# Patient Record
Sex: Male | Born: 1985 | Race: White | Hispanic: No | Marital: Single | Smoking: Current some day smoker
Health system: Southern US, Community
[De-identification: ages and names within clinical notes are randomized; demographics above are authoritative.]

## PROBLEM LIST (undated history)

## (undated) DIAGNOSIS — E119 Type 2 diabetes mellitus without complications: Secondary | ICD-10-CM

## (undated) DIAGNOSIS — R45851 Suicidal ideations: Secondary | ICD-10-CM

---
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
## 2003-03-26 ENCOUNTER — Encounter: Payer: Self-pay | Admitting: *Deleted

## 2003-03-26 ENCOUNTER — Emergency Department (HOSPITAL_COMMUNITY): Admission: EM | Admit: 2003-03-26 | Discharge: 2003-03-26 | Payer: Self-pay | Admitting: *Deleted

## 2003-05-21 ENCOUNTER — Emergency Department (HOSPITAL_COMMUNITY): Admission: EM | Admit: 2003-05-21 | Discharge: 2003-05-21 | Payer: Self-pay | Admitting: *Deleted

## 2003-05-21 ENCOUNTER — Encounter: Payer: Self-pay | Admitting: *Deleted

## 2003-06-04 ENCOUNTER — Emergency Department (HOSPITAL_COMMUNITY): Admission: EM | Admit: 2003-06-04 | Discharge: 2003-06-04 | Payer: Self-pay | Admitting: Emergency Medicine

## 2003-07-03 ENCOUNTER — Emergency Department (HOSPITAL_COMMUNITY): Admission: EM | Admit: 2003-07-03 | Discharge: 2003-07-03 | Payer: Self-pay | Admitting: *Deleted

## 2003-09-05 ENCOUNTER — Emergency Department (HOSPITAL_COMMUNITY): Admission: EM | Admit: 2003-09-05 | Discharge: 2003-09-06 | Payer: Self-pay | Admitting: *Deleted

## 2008-07-01 ENCOUNTER — Emergency Department (HOSPITAL_COMMUNITY): Admission: EM | Admit: 2008-07-01 | Discharge: 2008-07-01 | Payer: Self-pay | Admitting: Emergency Medicine

## 2009-04-28 ENCOUNTER — Emergency Department (HOSPITAL_COMMUNITY): Admission: EM | Admit: 2009-04-28 | Discharge: 2009-04-28 | Payer: Self-pay | Admitting: Emergency Medicine

## 2010-10-17 ENCOUNTER — Emergency Department (HOSPITAL_COMMUNITY)
Admission: EM | Admit: 2010-10-17 | Discharge: 2010-10-17 | Payer: Self-pay | Source: Home / Self Care | Admitting: Emergency Medicine

## 2010-11-11 ENCOUNTER — Emergency Department (HOSPITAL_COMMUNITY)
Admission: EM | Admit: 2010-11-11 | Discharge: 2010-11-11 | Payer: Self-pay | Source: Home / Self Care | Admitting: Emergency Medicine

## 2010-11-16 LAB — POCT CARDIAC MARKERS: Myoglobin, poc: 54.9 ng/mL (ref 12–200)

## 2012-01-18 ENCOUNTER — Encounter (HOSPITAL_COMMUNITY): Payer: Self-pay | Admitting: Emergency Medicine

## 2012-01-18 ENCOUNTER — Emergency Department (HOSPITAL_COMMUNITY)
Admission: EM | Admit: 2012-01-18 | Discharge: 2012-01-19 | Disposition: A | Discharge status: Home / Self Care | Payer: Self-pay | Attending: Emergency Medicine | Admitting: Emergency Medicine

## 2012-01-18 DIAGNOSIS — R112 Nausea with vomiting, unspecified: Secondary | ICD-10-CM | POA: Insufficient documentation

## 2012-01-18 DIAGNOSIS — B9789 Other viral agents as the cause of diseases classified elsewhere: Secondary | ICD-10-CM | POA: Insufficient documentation

## 2012-01-18 DIAGNOSIS — B349 Viral infection, unspecified: Secondary | ICD-10-CM

## 2012-01-18 DIAGNOSIS — R197 Diarrhea, unspecified: Secondary | ICD-10-CM | POA: Insufficient documentation

## 2012-01-18 DIAGNOSIS — IMO0001 Reserved for inherently not codable concepts without codable children: Secondary | ICD-10-CM | POA: Insufficient documentation

## 2012-01-18 LAB — BASIC METABOLIC PANEL WITH GFR
BUN: 14 mg/dL (ref 6–23)
CO2: 28 meq/L (ref 19–32)
GFR calc non Af Amer: >90 mL/min (ref >90)
Glucose, Bld: 105 mg/dL — ABNORMAL HIGH (ref 70–99)
Potassium: 3.8 meq/L (ref 3.5–5.1)
Sodium: 135 meq/L (ref 135–145)

## 2012-01-18 LAB — DIFFERENTIAL
Basophils Absolute: 0 10*3/uL (ref 0.0–0.1)
Basophils Relative: 0 % (ref 0–1)
Eosinophils Absolute: 0.3 K/uL (ref 0.0–0.7)
Eosinophils Relative: 3 % (ref 0–5)
Lymphocytes Relative: 40 % (ref 12–46)
Lymphs Abs: 3.6 K/uL (ref 0.7–4.0)
Monocytes Absolute: 0.7 10*3/uL (ref 0.1–1.0)
Monocytes Relative: 7 % (ref 3–12)
Neutro Abs: 4.6 10*3/uL (ref 1.7–7.7)
Neutrophils Relative %: 50 % (ref 43–77)

## 2012-01-18 LAB — CBC
HCT: 41.1 % (ref 39.0–52.0)
Hemoglobin: 14.3 g/dL (ref 13.0–17.0)
MCH: 29.3 pg (ref 26.0–34.0)
MCHC: 34.8 g/dL (ref 30.0–36.0)
MCV: 84.2 fL (ref 78.0–100.0)
Platelets: 244 10*3/uL (ref 150–400)
RBC: 4.88 MIL/uL (ref 4.22–5.81)
RDW: 12.9 % (ref 11.5–15.5)
WBC: 9.1 K/uL (ref 4.0–10.5)

## 2012-01-18 LAB — BASIC METABOLIC PANEL
Calcium: 9 mg/dL (ref 8.4–10.5)
Chloride: 98 mEq/L (ref 96–112)
Creatinine, Ser: 1.02 mg/dL (ref 0.50–1.35)
GFR calc Af Amer: >90 mL/min (ref >90)

## 2012-01-18 NOTE — ED Notes (Signed)
PT. REPORTS VOMITTING WITH DIARRHEA , CHILLS AND ABDOMINAL CRAMPING ONSET LAST NIGHT WORSE THIS EVENING .

## 2012-01-19 MED ORDER — PROMETHAZINE HCL 25 MG PO TABS: 25.0000 mg | ORAL_TABLET | Freq: Four times a day (QID) | ORAL | Status: AC | PRN

## 2012-01-19 MED ORDER — ONDANSETRON 4 MG PO TBDP
8.0000 mg | ORAL_TABLET | Freq: Once | ORAL | Status: AC
  Administered 2012-01-19: 8 mg via ORAL
  Filled 2012-01-19: qty 2

## 2012-01-19 NOTE — Discharge Instructions (Signed)
You were seen and evaluated today for your symptoms of nausea vomiting and diarrhea. Your lab test today did not show any signs for concerning cause of your symptoms. At this time your providers feel your symptoms are caused from a viral stomach infection. It is important a drink plenty of fluids to stay hydrated and get lots of rest to help your body fight the infection.  Viral Syndrome You or your child has Viral Syndrome. It is the most common infection causing "colds" and infections in the nose, throat, sinuses, and breathing tubes. Sometimes the infection causes nausea, vomiting, or diarrhea. The germ that causes the infection is a virus. No antibiotic or other medicine will kill it. There are medicines that you can take to make you or your child more comfortable.  HOME CARE INSTRUCTIONS   Rest in bed until you start to feel better.   If you have diarrhea or vomiting, eat small amounts of crackers and toast. Soup is helpful.   Do not give aspirin or medicine that contains aspirin to children.   Only take over-the-counter or prescription medicines for pain, discomfort, or fever as directed by your caregiver.  SEEK IMMEDIATE MEDICAL CARE IF:   You or your child has not improved within one week.   You or your child has pain that is not at least partially relieved by over-the-counter medicine.   Thick, colored mucus or blood is coughed up.   Discharge from the nose becomes thick yellow or green.   Diarrhea or vomiting gets worse.   There is any major change in your or your child's condition.   You or your child develops a skin rash, stiff neck, severe headache, or are unable to hold down food or fluid.   You or your child has an oral temperature above 102 F (38.9 C), not controlled by medicine.   Your baby is older than 3 months with a rectal temperature of 102 F (38.9 C) or higher.   Your baby is 59 months old or younger with a rectal temperature of 100.4 F (38 C) or higher.    Document Released: 09/26/2006 Document Revised: 09/30/2011 Document Reviewed: 09/27/2007 [facility] Patient Information 2012 [employer], [place].   RESOURCE GUIDE  Dental Problems  Patients with Medicaid: [facility] [phone] [address]                                           [phone] [address] Phone:  [phone]                                                  Phone:  [phone]  If unable to pay or uninsured, contact:  [facility] or [facility]. to become qualified for the adult dental clinic.  Chronic Pain Problems Contact [facility]  [phone] Patients need to be referred by their primary care doctor.  Insufficient Money for Medicine Contact United Way:  call "211" or [facility] [phone].  No Primary Care Doctor Call [facility]  3395184420 Other agencies that provide inexpensive medical care  [facility]  [phone]    [facility]  [phone]    [facility]  [phone]    [facility]  [phone]    [facility]  [phone]    [facility]  [phone]  Psychological Services [facility]  [phone] [facility]  [phone] [facility]   [phone] (emergency services [phone])  Substance Abuse Resources [facility]  ([phone] 213-1284 [facility] ([phone] [facility] ([phone] Floydene Flock [phone] Residential & Outpatient Substance Abuse Program  [phone]  Abuse/Neglect [facility] Child Abuse Hotline (262)170-2817 [facility] Child Abuse Hotline 819-609-7987 (After Hours)  Emergency Shelter [facility] [phone]  Maternity Homes [facility] at [facility] 617-430-1923 [facility] [phone]  MRSA Hotline #:   309-461-5995    [facility] Resources  [facility]     [employer]                          [facility] Dept. [address]                        [address]      [address]  Blondell Reveal Phone:  [phone]                                   Phone:  [phone]                 Phone:  [phone]  [facility] Phone:  [phone]  [facility] Child Abuse Hotline [phone] [phone] (After Hours)

## 2012-01-19 NOTE — ED Provider Notes (Signed)
History     CSN: [id number]  Arrival date & time 01/18/12  2200   First MD Initiated Contact with Patient 01/19/12 0045      Chief Complaint  Patient presents with  . Emesis     HPI  History provided by the patient. Patient is a 26 year old male with no significant past medical history presents with complaints of episodes of nausea vomiting and diarrhea that began yesterday. Patient reports having 3 episodes of vomiting throughout the day. Patient also has some associated chills, abdominal cramping and generalized fatigue.  Pt does not report any known sick contacts.  Pt has not taken anything for symptoms.  Pt has been able to tolerate PO fluids.  Symptoms are moderate.  He denies any other aggravating or alleviating factors.     History reviewed. No pertinent past medical history.  History reviewed. No pertinent past surgical history.  No family history on file.  History  Substance Use Topics  . Smoking status: Never Smoker   . Smokeless tobacco: Not on file  . Alcohol Use: No      Review of Systems  Constitutional: Positive for chills, appetite change and fatigue. Negative for fever.  HENT: Negative for congestion and rhinorrhea.   Respiratory: Negative for cough and shortness of breath.   Gastrointestinal: Positive for nausea, vomiting, abdominal pain and diarrhea.  Genitourinary: Negative for dysuria and flank pain.    Allergies  Review of patient's allergies indicates no known allergies.  Home Medications   Current Outpatient Rx  Name Route Sig Dispense Refill  . ACETAMINOPHEN 500 MG PO TABS Oral Take 1,000 mg by mouth once.      BP 116/77  Pulse 78  Temp(Src) 97.8 F (36.6 C) (Oral)  Resp 16  SpO2 100%  Physical Exam  Nursing note and vitals reviewed. Constitutional: He is oriented to person, place, and time. He appears well-developed and well-nourished. No distress.  HENT:  Head: Normocephalic.  Mouth/Throat: Oropharynx is clear and moist.    Neck: Normal range of motion. Neck supple.       No meningeal sign  Cardiovascular: Normal rate and regular rhythm.   Pulmonary/Chest: Effort normal and breath sounds normal. No respiratory distress. He has no wheezes.  Abdominal: Soft. He exhibits no distension. There is no tenderness. There is no rebound and no guarding.  Neurological: He is alert and oriented to person, place, and time.  Skin: Skin is warm. No rash noted.  Psychiatric: He has a normal mood and affect. His behavior is normal.    ED Course  Procedures   Results for orders placed during the hospital encounter of 01/18/12  CBC      Component Value Range   WBC 9.1  4.0 - 10.5 (K/uL)   RBC 4.88  4.22 - 5.81 (MIL/uL)   Hemoglobin 14.3  13.0 - 17.0 (g/dL)   HCT 40.9  81.1 - 91.4 (%)   MCV 84.2  78.0 - 100.0 (fL)   MCH 29.3  26.0 - 34.0 (pg)   MCHC 34.8  30.0 - 36.0 (g/dL)   RDW 78.2  95.6 - 21.3 (%)   Platelets 244  150 - 400 (K/uL)  DIFFERENTIAL      Component Value Range   Neutrophils Relative 50  43 - 77 (%)   Neutro Abs 4.6  1.7 - 7.7 (K/uL)   Lymphocytes Relative 40  12 - 46 (%)   Lymphs Abs 3.6  0.7 - 4.0 (K/uL)   Monocytes Relative 7  3 - 12 (%)   Monocytes Absolute 0.7  0.1 - 1.0 (K/uL)   Eosinophils Relative 3  0 - 5 (%)   Eosinophils Absolute 0.3  0.0 - 0.7 (K/uL)   Basophils Relative 0  0 - 1 (%)   Basophils Absolute 0.0  0.0 - 0.1 (K/uL)  BASIC METABOLIC PANEL      Component Value Range   Sodium 135  135 - 145 (mEq/L)   Potassium 3.8  3.5 - 5.1 (mEq/L)   Chloride 98  96 - 112 (mEq/L)   CO2 28  19 - 32 (mEq/L)   Glucose, Bld 105 (*) 70 - 99 (mg/dL)   BUN 14  6 - 23 (mg/dL)   Creatinine, Ser 4.09  0.50 - 1.35 (mg/dL)   Calcium 9.0  8.4 - 81.1 (mg/dL)   GFR calc non Af Amer >90  >90 (mL/min)   GFR calc Af Amer >90  >90 (mL/min)        1. Nausea vomiting and diarrhea   2. Viral syndrome       MDM  1:10 AM patient seen and evaluated. Patient no acute distress  Patient tolerating by  mouth fluids. Symptoms consistent with process. Labs unremarkable. Will discharge at this time.      Angus Seller, [place] 01/20/12 1715

## 2012-01-19 NOTE — ED Notes (Signed)
Pt c/o emesis x 2 and diarrhea x 1 since yesterday.  He states he came to ED b/c he vomited at work and his boss told him to come to the ED.  Denies pain.  Has only been drinking fluids today.

## 2012-01-19 NOTE — ED Notes (Signed)
Pt tolerated grape juice with no emesis.

## 2012-01-24 NOTE — ED Provider Notes (Signed)
Medical screening examination/treatment/procedure(s) were performed by non-physician practitioner and as supervising physician I was immediately available for consultation/collaboration.   Gavin Pound. Isyss Espinal, MD 01/24/12 1744

## 2012-06-01 ENCOUNTER — Encounter (HOSPITAL_COMMUNITY): Payer: Self-pay

## 2012-06-01 ENCOUNTER — Emergency Department (HOSPITAL_COMMUNITY): Payer: Self-pay

## 2012-06-01 ENCOUNTER — Emergency Department (HOSPITAL_COMMUNITY)
Admission: EM | Admit: 2012-06-01 | Discharge: 2012-06-01 | Disposition: A | Discharge status: Home / Self Care | Payer: Self-pay | Attending: Emergency Medicine | Admitting: Emergency Medicine

## 2012-06-01 DIAGNOSIS — R1032 Left lower quadrant pain: Secondary | ICD-10-CM | POA: Insufficient documentation

## 2012-06-01 LAB — URINALYSIS, ROUTINE W REFLEX MICROSCOPIC
Ketones, ur: NEGATIVE mg/dL
Leukocytes, UA: NEGATIVE
Nitrite: NEGATIVE
Specific Gravity, Urine: 1.024 (ref 1.005–1.030)
pH: 5.5 (ref 5.0–8.0)

## 2012-06-01 LAB — CBC WITH DIFFERENTIAL/PLATELET
Basophils Absolute: 0 10*3/uL (ref 0.0–0.1)
Lymphocytes Relative: 39 % (ref 12–46)
Neutro Abs: 5.8 10*3/uL (ref 1.7–7.7)
Neutrophils Relative %: 52 % (ref 43–77)
Platelets: 232 10*3/uL (ref 150–400)
RDW: 12.9 % (ref 11.5–15.5)
WBC: 11.1 10*3/uL — ABNORMAL HIGH (ref 4.0–10.5)

## 2012-06-01 LAB — COMPREHENSIVE METABOLIC PANEL
AST: 18 U/L (ref 0–37)
Albumin: 4.2 g/dL (ref 3.5–5.2)
Chloride: 101 mEq/L (ref 96–112)
Creatinine, Ser: 0.97 mg/dL (ref 0.50–1.35)
Potassium: 3.5 mEq/L (ref 3.5–5.1)
Total Bilirubin: 0.5 mg/dL (ref 0.3–1.2)
Total Protein: 7.3 g/dL (ref 6.0–8.3)

## 2012-06-01 MED ORDER — HYDROMORPHONE HCL PF 1 MG/ML IJ SOLN
1.0000 mg | Freq: Once | INTRAMUSCULAR | Status: AC
  Administered 2012-06-01: 1 mg via INTRAVENOUS
  Filled 2012-06-01: qty 1

## 2012-06-01 MED ORDER — ONDANSETRON 4 MG PO TBDP
4.0000 mg | ORAL_TABLET | Freq: Once | ORAL | Status: AC
  Administered 2012-06-01: 4 mg via ORAL
  Filled 2012-06-01: qty 1

## 2012-06-01 MED ORDER — ONDANSETRON HCL 4 MG/2ML IJ SOLN
4.0000 mg | Freq: Once | INTRAMUSCULAR | Status: AC
  Administered 2012-06-01: 4 mg via INTRAVENOUS
  Filled 2012-06-01: qty 2

## 2012-06-01 NOTE — ED Provider Notes (Signed)
History     CSN: [id number]  Arrival date & time 06/01/12  4098   First MD Initiated Contact with Patient 06/01/12 [phone]      Chief Complaint  Patient presents with  . Abdominal Pain    (Consider location/radiation/quality/duration/timing/severity/associated sxs/prior treatment) HPI Comments: Kevin Russell 26 y.o. male   The chief complaint is: Patient presents with:   Abdominal Pain    Patient awoke at 7:45 am this morning with acute LLQ pain which he describes as colicky and intermittent.  He states that he thought he needed to use the bathroom and sat for 45 minutes  Without going. He states that pain was intolerable.  His last BM was at midnight the night before. Patient often has hard stool, but uses the bathroom daily. Denies fevers, chills, myalgias, arthralgias, nausea, vomiting, diarrhea. Never had anything like this before. He  Denies melena, hematochezia. Denies any bulges in groin abdomen or scrotum. Denies history of diverticulosis. No urinary symptoms. No CP or SOB.      The history is provided by the patient and the spouse. No language interpreter was used.    History reviewed. No pertinent past medical history.  History reviewed. No pertinent past surgical history.  History reviewed. No pertinent family history.  History  Substance Use Topics  . Smoking status: Never Smoker   . Smokeless tobacco: Not on file  . Alcohol Use: No      Review of Systems  Constitutional: Negative for fever and chills.  Respiratory: Negative for shortness of breath.   Cardiovascular: Negative for chest pain.  Gastrointestinal: Positive for abdominal pain. Negative for diarrhea, constipation, blood in stool and abdominal distention.  Genitourinary: Negative for dysuria, frequency, hematuria, flank pain, scrotal swelling, penile pain and testicular pain.  Musculoskeletal: Negative for myalgias.    Allergies  Review of patient's allergies indicates no known  allergies.  Home Medications  No current outpatient prescriptions on file.  BP 100/57  Pulse 64  Temp 97.5 F (36.4 C) (Oral)  Resp 18  SpO2 98%  Physical Exam  Nursing note and vitals reviewed. Constitutional: He is oriented to person, place, and time. He appears well-developed and well-nourished. He appears distressed (patient is writhing around and appears to be in acute pain).  HENT:  Head: Normocephalic and atraumatic.  Eyes: Conjunctivae are normal. No scleral icterus.  Neck: Normal range of motion. Neck supple. No JVD present.  Cardiovascular: Normal rate, regular rhythm and normal heart sounds.   Pulmonary/Chest: Effort normal and breath sounds normal. No respiratory distress.  Abdominal: He exhibits no distension and no mass. There is tenderness. There is guarding. There is no rebound.  Genitourinary:       No scrotal masses. No direct or indirect hernias palpated.  Neurological: He is alert and oriented to person, place, and time.  Skin: Skin is warm and dry. He is not diaphoretic.    ED Course  Procedures (including critical care time) Results for orders placed during the hospital encounter of 06/01/12  CBC WITH DIFFERENTIAL      Component Value Range   WBC 11.1 (*) 4.0 - 10.5 K/uL   RBC 4.97  4.22 - 5.81 MIL/uL   Hemoglobin 14.5  13.0 - 17.0 g/dL   HCT 47.8  29.5 - 62.1 %   MCV 84.5  78.0 - 100.0 fL   MCH 29.2  26.0 - 34.0 pg   MCHC 34.5  30.0 - 36.0 g/dL   RDW 30.8  65.7 - 84.6 %  Platelets 232  150 - 400 K/uL   Neutrophils Relative 52  43 - 77 %   Neutro Abs 5.8  1.7 - 7.7 K/uL   Lymphocytes Relative 39  12 - 46 %   Lymphs Abs 4.3 (*) 0.7 - 4.0 K/uL   Monocytes Relative 8  3 - 12 %   Monocytes Absolute 0.9  0.1 - 1.0 K/uL   Eosinophils Relative 1  0 - 5 %   Eosinophils Absolute 0.2  0.0 - 0.7 K/uL   Basophils Relative 0  0 - 1 %   Basophils Absolute 0.0  0.0 - 0.1 K/uL  URINALYSIS, ROUTINE W REFLEX MICROSCOPIC      Component Value Range   Color,  Urine YELLOW  YELLOW   APPearance CLEAR  CLEAR   Specific Gravity, Urine 1.024  1.005 - 1.030   pH 5.5  5.0 - 8.0   Glucose, UA NEGATIVE  NEGATIVE mg/dL   Hgb urine dipstick NEGATIVE  NEGATIVE   Bilirubin Urine NEGATIVE  NEGATIVE   Ketones, ur NEGATIVE  NEGATIVE mg/dL   Protein, ur NEGATIVE  NEGATIVE mg/dL   Urobilinogen, UA 0.2  0.0 - 1.0 mg/dL   Nitrite NEGATIVE  NEGATIVE   Leukocytes, UA NEGATIVE  NEGATIVE  COMPREHENSIVE METABOLIC PANEL      Component Value Range   Sodium 139  135 - 145 mEq/L   Potassium 3.5  3.5 - 5.1 mEq/L   Chloride 101  96 - 112 mEq/L   CO2 29  19 - 32 mEq/L   Glucose, Bld 120 (*) 70 - 99 mg/dL   BUN 10  6 - 23 mg/dL   Creatinine, Ser 1.61  0.50 - 1.35 mg/dL   Calcium 9.1  8.4 - 09.6 mg/dL   Total Protein 7.3  6.0 - 8.3 g/dL   Albumin 4.2  3.5 - 5.2 g/dL   AST 18  0 - 37 U/L   ALT 11  0 - 53 U/L   Alkaline Phosphatase 70  39 - 117 U/L   Total Bilirubin 0.5  0.3 - 1.2 mg/dL   GFR calc non Af Amer >90  >90 mL/min   GFR calc Af Amer >90  >90 mL/min     Dg Abd Acute W/chest  06/01/2012  *RADIOLOGY REPORT*  Clinical Data: Left lower abdominal pain.  Fever.  ACUTE ABDOMEN SERIES (ABDOMEN 2 VIEW & CHEST 1 VIEW)  Comparison: None.  Findings: Cardiac and mediastinal contours appear normal.  The lungs appear clear.  No pleural effusion is identified.  Unremarkable bowel gas pattern noted.  Faint calcifications in the lower pelvis are likely vascular.  IMPRESSION:  1.  No acute findings to explain the patient's symptoms.  Original Report Authenticated By: Dellia Cloud, M.D.    12:06 PM BP 100/57  Pulse 64  Temp 97.5 F (36.4 C) (Oral)  Resp 18  SpO2 98% Patient is relaxed and cooperative after 1 mg dilaudid.  Labs and Xray are unremarkable for cause of acute pain.  Abdominal exam show no distention, masses, or guarding. [name] is non tender on examin the LLQ. I believe he is stable and does not have an emergent cause of abdominal pain. Pain likely  from constipation.  I will d/c the patient home with miralax and stool softeners. No diagnosis found.    MDM  D/c patient home. Patient with probable constipation. Discussed reasons to seek immediate care. Patient expresses understanding and agrees with plan.        Kevin Russell  Kevin Pea, PA-C 06/01/12 2025  Arthor Captain, PA-C 06/01/12 2029

## 2012-06-01 NOTE — ED Notes (Signed)
MD at bedside. 

## 2012-06-01 NOTE — ED Notes (Signed)
Lab at bedside

## 2012-06-01 NOTE — ED Notes (Signed)
pt reports waking 0745 this am w/LLQ pain, denies N/V/D, no hx of the same

## 2012-06-02 NOTE — ED Provider Notes (Signed)
Medical screening examination/treatment/procedure(s) were performed by non-physician practitioner and as supervising physician I was immediately available for consultation/collaboration.  Chirag Krueger L Ewell Benassi, MD 06/02/12 0940 

## 2017-04-06 ENCOUNTER — Encounter (HOSPITAL_COMMUNITY): Payer: Self-pay | Admitting: *Deleted

## 2017-04-06 ENCOUNTER — Emergency Department (HOSPITAL_COMMUNITY)
Admission: EM | Admit: 2017-04-06 | Discharge: 2017-04-07 | Disposition: A | Discharge status: Other Acute Inpatient Hospital | Payer: Self-pay | Attending: Emergency Medicine | Admitting: Emergency Medicine

## 2017-04-06 ENCOUNTER — Emergency Department (HOSPITAL_COMMUNITY): Payer: Self-pay

## 2017-04-06 DIAGNOSIS — K047 Periapical abscess without sinus: Secondary | ICD-10-CM | POA: Diagnosis present

## 2017-04-06 DIAGNOSIS — L03211 Cellulitis of face: Secondary | ICD-10-CM | POA: Diagnosis present

## 2017-04-06 LAB — CBC WITH DIFFERENTIAL/PLATELET
BASOS ABS: 0 10*3/uL (ref 0.0–0.1)
BASOS PCT: 0 %
EOS ABS: 0 10*3/uL (ref 0.0–0.7)
Eosinophils Relative: 0 %
HCT: 42.3 % (ref 39.0–52.0)
Hemoglobin: 14.6 g/dL (ref 13.0–17.0)
Lymphocytes Relative: 19 %
Lymphs Abs: 2.8 10*3/uL (ref 0.7–4.0)
MCH: 29.6 pg (ref 26.0–34.0)
MCHC: 34.5 g/dL (ref 30.0–36.0)
MCV: 85.6 fL (ref 78.0–100.0)
MONO ABS: 1.8 10*3/uL — AB (ref 0.1–1.0)
MONOS PCT: 12 %
Neutro Abs: 10.2 10*3/uL — ABNORMAL HIGH (ref 1.7–7.7)
Neutrophils Relative %: 69 %
Platelets: 250 10*3/uL (ref 150–400)
RBC: 4.94 MIL/uL (ref 4.22–5.81)
RDW: 12.5 % (ref 11.5–15.5)
WBC: 14.8 10*3/uL — ABNORMAL HIGH (ref 4.0–10.5)

## 2017-04-06 LAB — I-STAT CHEM 8, ED
BUN: 10 mg/dL (ref 6–20)
Calcium, Ion: 1.07 mmol/L — ABNORMAL LOW (ref 1.15–1.40)
Chloride: 99 mmol/L — ABNORMAL LOW (ref 101–111)
Creatinine, Ser: 1.3 mg/dL — ABNORMAL HIGH (ref 0.61–1.24)
Glucose, Bld: 96 mg/dL (ref 65–99)
HEMATOCRIT: 42 % (ref 39.0–52.0)
HEMOGLOBIN: 14.3 g/dL (ref 13.0–17.0)
POTASSIUM: 3.8 mmol/L (ref 3.5–5.1)
Sodium: 136 mmol/L (ref 135–145)
TCO2: 27 mmol/L (ref 0–100)

## 2017-04-06 MED ORDER — ACETAMINOPHEN 325 MG PO TABS
650.0000 mg | ORAL_TABLET | Freq: Once | ORAL | Status: AC
  Administered 2017-04-06: 650 mg via ORAL
  Filled 2017-04-06: qty 2

## 2017-04-06 MED ORDER — IOPAMIDOL (ISOVUE-300) INJECTION 61%
INTRAVENOUS | Status: AC
  Administered 2017-04-06: 75 mL
  Filled 2017-04-06: qty 75

## 2017-04-06 MED ORDER — SODIUM CHLORIDE 0.9 % IV BOLUS (SEPSIS)
1000.0000 mL | Freq: Once | INTRAVENOUS | Status: AC
  Administered 2017-04-06: 1000 mL via INTRAVENOUS

## 2017-04-06 MED ORDER — CLINDAMYCIN PHOSPHATE 600 MG/50ML IV SOLN
600.0000 mg | Freq: Once | INTRAVENOUS | Status: AC
  Administered 2017-04-06: 600 mg via INTRAVENOUS
  Filled 2017-04-06: qty 50

## 2017-04-06 NOTE — ED Provider Notes (Signed)
[facility]-EMERGENCY DEPT Provider Note   CSN: [id number] Arrival date & time: 04/06/17  1616  By signing my name below, I, Freida Busmaniana Omoyeni, attest that this documentation has been prepared under the direction and in the presence of  Leamon Palau PA-C. Electronically Signed: Freida Busmaniana Omoyeni, Scribe. 04/06/2017. 10:05 PM.  History   Chief Complaint Chief Complaint  Patient presents with  . Abscess  . Fever     The history is provided by the patient. No language interpreter was used.    HPI Comments:  Kevin Russell is a 31 y.o. male who presents to the Emergency Department complaining of right upper dental pain with associated right sided facial swelling x 4 days. He denies acute injury trauma to his face/ mouth. He was seen at the dentist yesterday, diagnosed with an abscess, and started on amoxicillin. Patient took 500 mg yesterday afternoon, 1 dose last night, and 2 doses today. Pt reports intermittent fever since last night and notes facial swelling worsened this AM; states swelling has reached just below the right eye. He notes throbbing pain to the site.  No sore throat, difficulty swallowing, difficulty breathing, or chest miss. He denies any neck stiffness or neck pain. Pt reports a h/o dental abscess, but none recently. Pt was advised to come to the ED by the dentist for further evaluation.   History reviewed. No pertinent past medical history.  Patient Active Problem List   Diagnosis Date Noted  . Facial cellulitis 04/07/2017  . Periapical abscess 04/07/2017    History reviewed. No pertinent surgical history.     Home Medications    Prior to Admission medications   Not on File    Family History History reviewed. No pertinent family history.  Social History Social History  Substance Use Topics  . Smoking status: Never Smoker  . Smokeless tobacco: Not on file  . Alcohol use No     Allergies   Patient has no known allergies.   Review of Systems Review of  Systems  Constitutional: Positive for fever.  HENT: Positive for dental problem and facial swelling. Negative for ear pain, sore throat, trouble swallowing and voice change.   Eyes: Negative for photophobia, pain and visual disturbance.  Respiratory: Negative for cough and shortness of breath.   Cardiovascular: Negative for chest pain.  Gastrointestinal: Negative for abdominal pain, nausea and vomiting.  Genitourinary: Negative for dysuria, flank pain and hematuria.  Musculoskeletal: Negative for arthralgias, neck pain and neck stiffness.       No trismus  Skin: Negative for rash.  Allergic/Immunologic: Negative for immunocompromised state.  Neurological: Negative for dizziness and headaches.  Hematological: Negative for adenopathy.  Psychiatric/Behavioral: Negative for confusion.  All other systems reviewed and are negative.    Physical Exam Updated Vital Signs BP 133/82 (BP Location: Left Arm)   Pulse 80   Temp 99.5 F (37.5 C) (Oral)   Resp 13   SpO2 100%   Physical Exam  Constitutional: He is oriented to person, place, and time. He appears well-developed and well-nourished. No distress.  HENT:  Head: Normocephalic and atraumatic.  Right Ear: Hearing, tympanic membrane, external ear and ear canal normal. No tenderness.  Left Ear: Hearing, tympanic membrane, external ear and ear canal normal. No tenderness.  Mouth/Throat: Uvula is midline, oropharynx is clear and moist and mucous membranes are normal. No trismus in the jaw. Dental caries present.  No erythema of the face. Obvious swelling of the upper lip and right cheek. Erythema and tenderness of  upper gum on R side. No drainage noted. Dental caries noted.   Eyes: Conjunctivae, EOM and lids are normal. Pupils are equal, round, and reactive to light.  Some bruising noted beneath the right eye.  Cardiovascular: Normal rate, regular rhythm, normal heart sounds and intact distal pulses.   Pulmonary/Chest: Effort normal and  breath sounds normal. He has no wheezes.  Abdominal: Soft. He exhibits no distension.  Musculoskeletal: Normal range of motion.  Lymphadenopathy:    He has no cervical adenopathy.  Neurological: He is alert and oriented to person, place, and time.  Skin: Skin is warm and dry.  Psychiatric: He has a normal mood and affect.  Nursing note and vitals reviewed.    ED Treatments / Results  DIAGNOSTIC STUDIES:  Oxygen Saturation is 100% on RA, normal by my interpretation.    COORDINATION OF CARE:  9:57 PM Discussed treatment plan with pt at bedside and pt agreed to plan.  Labs (all labs ordered are listed, but only abnormal results are displayed) Labs Reviewed  CBC WITH DIFFERENTIAL/PLATELET - Abnormal; Notable for the following:       Result Value   WBC 14.8 (*)    Neutro Abs 10.2 (*)    Monocytes Absolute 1.8 (*)    All other components within normal limits  I-STAT CHEM 8, ED - Abnormal; Notable for the following:    Chloride 99 (*)    Creatinine, Ser 1.30 (*)    Calcium, Ion 1.07 (*)    All other components within normal limits    EKG  EKG Interpretation None       Radiology Ct Maxillofacial W Contrast  Result Date: 04/06/2017 CLINICAL DATA:  RIGHT dental abscess and facial swelling, began antibiotic yesterday. Febrile today with worsening swelling. EXAM: CT MAXILLOFACIAL WITH CONTRAST TECHNIQUE: Multidetector CT imaging of the maxillofacial structures was performed. Multiplanar CT image reconstructions were also generated. A small metallic BB was placed on the right temple in order to reliably differentiate right from left. COMPARISON:  None. FINDINGS: OSSEOUS: Multiple dental caries and periapical lucency/abscess with periosteal re- absorption. Large tooth 7 periapical abscess associated with soft tissue abscess is described below. No acute facial fracture. RIGHT maxillary oral antral fistula. ORBITS: Ocular globes and orbital contents are normal. SINUSES: Mild paranasal  sinus mucosal thickening and scattered subcentimeter mucosal retention cyst. Mild RIGHT maxillary sinus bony remodeling. Mastoid air cells are well aerated. Nasal septum is midline. SOFT TISSUES: 7 x 19 x 25 mm (AP by transverse by CC) RIGHT pre maxillary subperiosteal abscess was overlying soft tissue swelling, subcutaneous fat stranding consistent with cellulitis extending to RIGHT orbital soft tissues. No subcutaneous gas or radiopaque foreign bodies. LIMITED INTRACRANIAL: Normal. IMPRESSION: Tooth 7 periapical abscess, with overlying 7 x 19 x 25 mm abscess within the pre maxillary soft tissues. Generalized RIGHT facial cellulitis without postseptal extent. Poor dentition. Electronically Signed   By: Awilda Metro M.D.   On: 04/06/2017 23:51    Procedures Procedures (including critical care time)  Medications Ordered in ED Medications  clindamycin (CLEOCIN) IVPB 600 mg (0 mg Intravenous Stopped 04/06/17 2307)  acetaminophen (TYLENOL) tablet 650 mg (650 mg Oral Given 04/06/17 2237)  iopamidol (ISOVUE-300) 61 % injection (75 mLs  Contrast Given 04/06/17 2316)  sodium chloride 0.9 % bolus 1,000 mL (0 mLs Intravenous Stopped 04/07/17 0017)  sodium chloride 0.9 % bolus 1,000 mL (1,000 mLs Intravenous New Bag/Given 04/07/17 0158)  oxyCODONE-acetaminophen (PERCOCET/ROXICET) 5-325 MG per tablet 1 tablet (1 tablet Oral Given 04/07/17  0157)     Initial Impression / Assessment and Plan / ED Course  I have reviewed the triage vital signs and the nursing notes.  Pertinent labs & imaging results that were available during my care of the patient were reviewed by me and considered in my medical decision making (see chart for details).     Initial assessment concerning, as patient has been taking antibiotics, but there is obvious progression of his symptoms. Will order CT to rule out bone or orbital involvement. Will start patient on clindamycin. Tylenol ordered for pain. IV fluids due to patient's labs and  vitals.  CT showed peri-apical abscess and an abscess in the premaxillary soft tissue with generalized right facial cellulitis. Due to rapid spread of swelling, despite being on antibiotics and elevated white count, will consult about admission for this patient.  Hospitalist consulted for admission, but as [facility] does not have oral surgery, it was recommended that patient be transferred [facility]. Dentistry (Dr. Maryagnes Amos) at [facility] contacted, and stated that these abscesses are managed by ENT. ENT at [facility] was contacted, and Dr. Darnelle Bos accepted patient for transport. Patient to do an ER to ER transfer.   Patient is self-pay, and at this time is stable enough to go POV to [facility]. Patient informed that this will cause a longer delay in his care, as he will have to go to triage and the waiting room as with every other patient coming to the ER. Patient also told that the risks of going POV include worsening of symptoms without monitoring. Patient states he understands this, but due to cost, he would like to go POV. Will finish second liter of IV fluids prior to leaving.   Final Clinical Impressions(s) / ED Diagnoses   Final diagnoses:  Dental abscess    New Prescriptions New Prescriptions   No medications on file   I personally performed the services described in this documentation, which was scribed in my presence. The recorded information has been reviewed and is accurate.     Alveria Apley, PA-C 04/07/17 0243    Gerhard Munch, MD 04/07/17 1230

## 2017-04-06 NOTE — ED Triage Notes (Signed)
Pt reports right side dental abscess with facial swelling. Pt went to dentist yesterday and was started on antibiotics. reports fever last night and today. Pt reports increase in swelling to right eye today. Airway intact at triage.

## 2017-04-07 DIAGNOSIS — L03211 Cellulitis of face: Secondary | ICD-10-CM | POA: Diagnosis present

## 2017-04-07 DIAGNOSIS — K047 Periapical abscess without sinus: Secondary | ICD-10-CM | POA: Diagnosis present

## 2017-04-07 MED ORDER — OXYCODONE-ACETAMINOPHEN 5-325 MG PO TABS
1.0000 | ORAL_TABLET | Freq: Once | ORAL | Status: AC
Start: 2017-04-07 — End: 2017-04-07
  Administered 2017-04-07: 1 via ORAL
  Filled 2017-04-07: qty 1

## 2017-04-07 MED ORDER — SODIUM CHLORIDE 0.9 % IV BOLUS (SEPSIS)
1000.0000 mL | Freq: Once | INTRAVENOUS | Status: AC
  Administered 2017-04-07: 1000 mL via INTRAVENOUS

## 2017-04-07 NOTE — ED Notes (Signed)
[facility] called for transport. 

## 2017-04-07 NOTE — ED Notes (Signed)
Faxed demographic sheet

## 2017-04-07 NOTE — ED Notes (Signed)
Wayna Chaletalled Tammy, Charge [facility] ED to let her know pt was coming by POV.

## 2018-02-11 ENCOUNTER — Encounter (HOSPITAL_COMMUNITY): Payer: Self-pay | Admitting: Emergency Medicine

## 2018-02-11 ENCOUNTER — Other Ambulatory Visit: Payer: Self-pay

## 2018-02-11 ENCOUNTER — Inpatient Hospital Stay (HOSPITAL_COMMUNITY)
Admission: EM | Admit: 2018-02-11 | Discharge: 2018-02-12 | DRG: 638 | Disposition: A | Discharge status: Home / Self Care | Payer: Self-pay | Attending: Internal Medicine | Admitting: Internal Medicine

## 2018-02-11 DIAGNOSIS — E871 Hypo-osmolality and hyponatremia: Secondary | ICD-10-CM | POA: Diagnosis present

## 2018-02-11 DIAGNOSIS — E119 Type 2 diabetes mellitus without complications: Secondary | ICD-10-CM

## 2018-02-11 DIAGNOSIS — E1310 Other specified diabetes mellitus with ketoacidosis without coma: Secondary | ICD-10-CM

## 2018-02-11 DIAGNOSIS — H938X2 Other specified disorders of left ear: Secondary | ICD-10-CM

## 2018-02-11 DIAGNOSIS — E1010 Type 1 diabetes mellitus with ketoacidosis without coma: Principal | ICD-10-CM | POA: Diagnosis present

## 2018-02-11 DIAGNOSIS — E1110 Type 2 diabetes mellitus with ketoacidosis without coma: Secondary | ICD-10-CM | POA: Diagnosis present

## 2018-02-11 LAB — BASIC METABOLIC PANEL
ANION GAP: 17 — AB (ref 5–15)
ANION GAP: 11 (ref 5–15)
Anion gap: 8 (ref 5–15)
Anion gap: 9 (ref 5–15)
BUN: 21 mg/dL — AB (ref 6–20)
BUN: 17 mg/dL (ref 6–20)
BUN: 17 mg/dL (ref 6–20)
BUN: 16 mg/dL (ref 6–20)
CALCIUM: 8.9 mg/dL (ref 8.9–10.3)
CHLORIDE: 102 mmol/L (ref 101–111)
CO2: 23 mmol/L (ref 22–32)
CO2: 25 mmol/L (ref 22–32)
CO2: 27 mmol/L (ref 22–32)
CO2: 27 mmol/L (ref 22–32)
CREATININE: 0.84 mg/dL (ref 0.61–1.24)
Calcium: 9.1 mg/dL (ref 8.9–10.3)
Calcium: 8.7 mg/dL — ABNORMAL LOW (ref 8.9–10.3)
Calcium: 8.5 mg/dL — ABNORMAL LOW (ref 8.9–10.3)
Chloride: 95 mmol/L — ABNORMAL LOW (ref 101–111)
Chloride: 102 mmol/L (ref 101–111)
Chloride: 103 mmol/L (ref 101–111)
Creatinine, Ser: 0.95 mg/dL (ref 0.61–1.24)
Creatinine, Ser: 0.73 mg/dL (ref 0.61–1.24)
Creatinine, Ser: 0.8 mg/dL (ref 0.61–1.24)
GFR calc Af Amer: >60 mL/min (ref >60)
GFR calc Af Amer: >60 mL/min (ref >60)
GFR calc Af Amer: >60 mL/min (ref >60)
GFR calc Af Amer: >60 mL/min (ref >60)
GFR calc non Af Amer: >60 mL/min (ref >60)
GFR calc non Af Amer: >60 mL/min (ref >60)
GLUCOSE: 280 mg/dL — AB (ref 65–99)
Glucose, Bld: 451 mg/dL — ABNORMAL HIGH (ref 65–99)
Glucose, Bld: 207 mg/dL — ABNORMAL HIGH (ref 65–99)
Glucose, Bld: 151 mg/dL — ABNORMAL HIGH (ref 65–99)
POTASSIUM: 4.1 mmol/L (ref 3.5–5.1)
POTASSIUM: 4.3 mmol/L (ref 3.5–5.1)
POTASSIUM: 4 mmol/L (ref 3.5–5.1)
Potassium: 3.4 mmol/L — ABNORMAL LOW (ref 3.5–5.1)
SODIUM: 135 mmol/L (ref 135–145)
Sodium: 138 mmol/L (ref 135–145)
Sodium: 138 mmol/L (ref 135–145)
Sodium: 138 mmol/L (ref 135–145)

## 2018-02-11 LAB — COMPREHENSIVE METABOLIC PANEL
ALT: 26 U/L (ref 17–63)
ANION GAP: 21 — AB (ref 5–15)
AST: 24 U/L (ref 15–41)
Albumin: 5 g/dL (ref 3.5–5.0)
Alkaline Phosphatase: 117 U/L (ref 38–126)
BILIRUBIN TOTAL: 1.6 mg/dL — AB (ref 0.3–1.2)
BUN: 24 mg/dL — ABNORMAL HIGH (ref 6–20)
CO2: 21 mmol/L — ABNORMAL LOW (ref 22–32)
Calcium: 9.9 mg/dL (ref 8.9–10.3)
Chloride: 92 mmol/L — ABNORMAL LOW (ref 101–111)
Creatinine, Ser: 0.99 mg/dL (ref 0.61–1.24)
GFR calc Af Amer: >60 mL/min (ref >60)
Glucose, Bld: 536 mg/dL (ref 65–99)
POTASSIUM: 4 mmol/L (ref 3.5–5.1)
Sodium: 134 mmol/L — ABNORMAL LOW (ref 135–145)
TOTAL PROTEIN: 8.9 g/dL — AB (ref 6.5–8.1)

## 2018-02-11 LAB — GLUCOSE, CAPILLARY
GLUCOSE-CAPILLARY: 217 mg/dL — AB (ref 65–99)
GLUCOSE-CAPILLARY: 201 mg/dL — AB (ref 65–99)
GLUCOSE-CAPILLARY: 102 mg/dL — AB (ref 65–99)
GLUCOSE-CAPILLARY: 102 mg/dL — AB (ref 65–99)
GLUCOSE-CAPILLARY: 264 mg/dL — AB (ref 65–99)
Glucose-Capillary: 191 mg/dL — ABNORMAL HIGH (ref 65–99)
Glucose-Capillary: 120 mg/dL — ABNORMAL HIGH (ref 65–99)
Glucose-Capillary: 239 mg/dL — ABNORMAL HIGH (ref 65–99)

## 2018-02-11 LAB — CBC
HEMATOCRIT: 42.7 % (ref 39.0–52.0)
HEMOGLOBIN: 15.3 g/dL (ref 13.0–17.0)
MCH: 29.3 pg (ref 26.0–34.0)
MCHC: 35.8 g/dL (ref 30.0–36.0)
MCV: 81.6 fL (ref 78.0–100.0)
Platelets: 248 10*3/uL (ref 150–400)
RBC: 5.23 MIL/uL (ref 4.22–5.81)
RDW: 12 % (ref 11.5–15.5)
WBC: 7 10*3/uL (ref 4.0–10.5)

## 2018-02-11 LAB — MRSA PCR SCREENING: MRSA by PCR: NEGATIVE

## 2018-02-11 LAB — URINALYSIS, ROUTINE W REFLEX MICROSCOPIC
BILIRUBIN URINE: NEGATIVE
Bacteria, UA: NONE SEEN
KETONES UR: 80 mg/dL — AB
LEUKOCYTES UA: NEGATIVE
Nitrite: NEGATIVE
PH: 5 (ref 5.0–8.0)
Protein, ur: NEGATIVE mg/dL
SQUAMOUS EPITHELIAL / LPF: NONE SEEN
Specific Gravity, Urine: 1.031 — ABNORMAL HIGH (ref 1.005–1.030)

## 2018-02-11 LAB — HEMOGLOBIN A1C
Hgb A1c MFr Bld: 10.4 % — ABNORMAL HIGH (ref 4.8–5.6)
MEAN PLASMA GLUCOSE: 251.78 mg/dL

## 2018-02-11 LAB — TSH: TSH: 1.305 u[IU]/mL (ref 0.350–4.500)

## 2018-02-11 LAB — CBG MONITORING, ED
GLUCOSE-CAPILLARY: 445 mg/dL — AB (ref 65–99)
Glucose-Capillary: 402 mg/dL — ABNORMAL HIGH (ref 65–99)

## 2018-02-11 MED ORDER — DEXTROSE-NACL 5-0.45 % IV SOLN
INTRAVENOUS | Status: DC
  Administered 2018-02-11: 13:00:00 via INTRAVENOUS

## 2018-02-11 MED ORDER — POTASSIUM CHLORIDE 10 MEQ/100ML IV SOLN
INTRAVENOUS | Status: AC
  Filled 2018-02-11: qty 100

## 2018-02-11 MED ORDER — SODIUM CHLORIDE 0.9 % IV BOLUS
1000.0000 mL | Freq: Once | INTRAVENOUS | Status: AC
  Administered 2018-02-11: 1000 mL via INTRAVENOUS

## 2018-02-11 MED ORDER — SODIUM CHLORIDE 0.9 % IV SOLN
INTRAVENOUS | Status: DC
  Administered 2018-02-11: 3.4 [IU]/h via INTRAVENOUS
  Filled 2018-02-11: qty 1

## 2018-02-11 MED ORDER — SODIUM CHLORIDE 0.9 % IV SOLN: INTRAVENOUS | Status: DC

## 2018-02-11 MED ORDER — DEXTROSE-NACL 5-0.45 % IV SOLN: INTRAVENOUS | Status: DC

## 2018-02-11 MED ORDER — SODIUM CHLORIDE 0.9 % IV SOLN
INTRAVENOUS | Status: DC
  Administered 2018-02-11: 13:00:00 via INTRAVENOUS

## 2018-02-11 MED ORDER — SODIUM CHLORIDE 0.9 % IV SOLN
INTRAVENOUS | Status: DC
  Administered 2018-02-11: 11:00:00 via INTRAVENOUS

## 2018-02-11 MED ORDER — SODIUM CHLORIDE 0.9 % IV SOLN: INTRAVENOUS | Status: AC

## 2018-02-11 MED ORDER — POTASSIUM CHLORIDE 10 MEQ/100ML IV SOLN
10.0000 meq | INTRAVENOUS | Status: AC
  Administered 2018-02-11 (×2): 10 meq via INTRAVENOUS
  Administered 2018-02-11: 12:00:00 via INTRAVENOUS
  Filled 2018-02-11: qty 100

## 2018-02-11 MED ORDER — SODIUM CHLORIDE 0.9 % IV BOLUS: 1000.0000 mL | Freq: Once | INTRAVENOUS | Status: DC

## 2018-02-11 MED ORDER — INSULIN ASPART 100 UNIT/ML ~~LOC~~ SOLN
0.0000 [IU] | SUBCUTANEOUS | Status: DC
Start: 2018-02-11 — End: 2018-02-12
  Administered 2018-02-12: 2 [IU] via SUBCUTANEOUS
  Administered 2018-02-12: 3 [IU] via SUBCUTANEOUS
  Administered 2018-02-12: 5 [IU] via SUBCUTANEOUS

## 2018-02-11 MED ORDER — INSULIN GLARGINE 100 UNIT/ML ~~LOC~~ SOLN
5.0000 [IU] | Freq: Once | SUBCUTANEOUS | Status: AC
  Administered 2018-02-11: 5 [IU] via SUBCUTANEOUS
  Filled 2018-02-11: qty 0.05

## 2018-02-11 MED ORDER — ENOXAPARIN SODIUM 40 MG/0.4ML ~~LOC~~ SOLN
40.0000 mg | SUBCUTANEOUS | Status: DC
  Filled 2018-02-11: qty 0.4

## 2018-02-11 MED ORDER — LIVING WELL WITH DIABETES BOOK
Freq: Once | Status: AC
  Administered 2018-02-12: 11:00:00
  Filled 2018-02-11: qty 1

## 2018-02-11 NOTE — ED Provider Notes (Signed)
[facility] EMERGENCY DEPARTMENT Provider Note   CSN: [id number] Arrival date & time: 02/11/18  4098     History   Chief Complaint Chief Complaint  Patient presents with  . Otalgia    HPI Kevin Russell is a 32 y.o. male.  HPI  Patient is a 32 year old male with no significant past medical history presenting for left ear fullness, bilateral blurred vision, increased thirst, and generalized malaise.  Patient reports that the left ear fullness began 2 days ago, and is associated with some minor congestion, but minimal rhinorrhea.  Patient denies otalgia.  No fever or chills.  No sore throat.  No coughing, shortness of breath, abdominal pain, nausea or vomiting.  Patient also reporting that for the past 2 weeks he has noticed a continuous blurred vision in bilateral eyes.  No diplopia.  Patient denies vertigo, changes in speech, weakness or numbness in distal extremities, or gait disturbance.  Patient is also noting that he feels "dehydrated" because he said he drink approximately 6 bottles of Gatorade and 8 bottles of water yesterday with continuous urination but his mouth feels extremely dry.  Patient reports he has never felt this before.  Patient denies any personal or family history of diabetes.  History reviewed. No pertinent past medical history.  Patient Active Problem List   Diagnosis Date Noted  . Facial cellulitis 04/07/2017  . Periapical abscess 04/07/2017    History reviewed. No pertinent surgical history.      Home Medications    Prior to Admission medications   Not on File    Family History No family history on file.  Social History Social History   Tobacco Use  . Smoking status: Never Smoker  . Smokeless tobacco: Never Used  Substance Use Topics  . Alcohol use: No  . Drug use: No     Allergies   Patient has no known allergies.   Review of Systems Review of Systems  Constitutional: Negative for chills and fever.  HENT: Positive for  congestion and rhinorrhea. Negative for ear pain, sinus pressure, sinus pain and sore throat.        +Ear fullness  Eyes: Positive for visual disturbance.  Respiratory: Negative for cough.   Cardiovascular: Negative for chest pain.  Gastrointestinal: Negative for nausea and vomiting.  Endocrine: Positive for polydipsia and polyuria. Negative for polyphagia.  Genitourinary: Negative for dysuria.  Musculoskeletal: Negative for myalgias.  Skin: Negative for rash.  Neurological: Positive for dizziness. Negative for weakness, light-headedness, numbness and headaches.     Physical Exam Updated Vital Signs BP 132/89 (BP Location: Right Arm)   Pulse 93   Temp 97.8 F (36.6 C) (Oral)   Resp 18   Ht 5\' 11"  (1.803 m)   Wt 69.9 kg (154 lb)   SpO2 100%   BMI 21.48 kg/m   Physical Exam  Constitutional: He appears well-developed and well-nourished. No distress.  HENT:  Head: Normocephalic and atraumatic.  Right Ear: Tympanic membrane, external ear and ear canal normal. No middle ear effusion.  Left Ear: Tympanic membrane, external ear and ear canal normal.  No middle ear effusion.  Mouth/Throat: Oropharynx is clear and moist.  Eyes: Pupils are equal, round, and reactive to light. Conjunctivae and EOM are normal.  Neck: Normal range of motion. Neck supple.  Cardiovascular: Normal rate, regular rhythm, S1 normal and S2 normal.  Murmur heard. Split S2 noted.   Pulmonary/Chest: Effort normal and breath sounds normal. He has no wheezes. He has no rales.  Abdominal: He exhibits no distension.  Musculoskeletal: Normal range of motion. He exhibits no edema or deformity.  Lymphadenopathy:    He has no cervical adenopathy.  Neurological: He is alert.  Cranial nerves grossly intact. Strength 5 out of 5 in upper and lower extremities. Normal and symmetric gait.  Skin: Skin is warm and dry. No rash noted. No erythema.  Psychiatric: He has a normal mood and affect. His behavior is normal.  Judgment and thought content normal.  Nursing note and vitals reviewed.    ED Treatments / Results  Labs (all labs ordered are listed, but only abnormal results are displayed) Labs Reviewed  URINALYSIS, ROUTINE W REFLEX MICROSCOPIC - Abnormal; Notable for the following components:      Result Value   Color, Urine STRAW (*)    Specific Gravity, Urine 1.031 (*)    Glucose, UA >=500 (*)    Hgb urine dipstick SMALL (*)    Ketones, ur 80 (*)    All other components within normal limits  COMPREHENSIVE METABOLIC PANEL - Abnormal; Notable for the following components:   Sodium 134 (*)    Chloride 92 (*)    CO2 21 (*)    Glucose, Bld 536 (*)    BUN 24 (*)    Total Protein 8.9 (*)    Total Bilirubin 1.6 (*)    Anion gap 21 (*)    All other components within normal limits  BASIC METABOLIC PANEL - Abnormal; Notable for the following components:   Chloride 95 (*)    Glucose, Bld 451 (*)    BUN 21 (*)    Anion gap 17 (*)    All other components within normal limits  CBG MONITORING, ED - Abnormal; Notable for the following components:   Glucose-Capillary 445 (*)    All other components within normal limits  CBG MONITORING, ED - Abnormal; Notable for the following components:   Glucose-Capillary 402 (*)    All other components within normal limits  CBC  HEMOGLOBIN A1C  BASIC METABOLIC PANEL  BASIC METABOLIC PANEL  HIV ANTIBODY (ROUTINE TESTING)  BASIC METABOLIC PANEL  BASIC METABOLIC PANEL  BASIC METABOLIC PANEL  TSH    EKG None  Radiology No results found.  Procedures Procedures (including critical care time)  Medications Ordered in ED Medications  sodium chloride 0.9 % bolus 1,000 mL (1,000 mLs Intravenous New Bag/Given 02/11/18 0954)     Initial Impression / Assessment and Plan / ED Course  I have reviewed the triage vital signs and the nursing notes.  Pertinent labs & imaging results that were available during my care of the patient were reviewed by me and  considered in my medical decision making (see chart for details).     Patient is nontoxic-appearing, afebrile, and in no acute distress.  Bilateral TMs are without effusion or erythema.  Suspect patient's left ear fullness may be due to eustachian tube dysfunction.    Patient exhibits a fasting plasma glucose of 445.  This is consistent with patient's symptoms of blurred vision,  polydipsia, and dry mouth.  Unclear type I versus type II picture in a thin 32 year old male with no family history of diabetes.  Spoke with case management Morrie Sheldon at [facility] who recommends that [facility] referral to diabetes education be placed, as well as case management consult to assist patient in getting into free clinic [place].  Patient is in the process of obtaining insurance through his job.    10:43 AM anion gap  noted to be 21.  Bicarb is marginally low at 23.  Will discuss case with hospital medicine and admit patient to close anion gap with glucose stabilizer.  Patient reports that he will stay.  Case was discussed with Dr. Gwenlyn PerkingMadera of [facility], who provided recommendations at the patient is unable to stay to start patient on Novolin 70/30.  11:15 Patient reports that he will stay for admission.  This is a supervised visit with Dr. Benjiman CoreNathan Pickering. Evaluation, management, and discharge planning discussed with this attending physician.  Final Clinical Impressions(s) / ED Diagnoses   Final diagnoses:  Diabetes mellitus, new onset (HCC)  Ear fullness, left    ED Discharge Orders        Ordered    Ambulatory referral to Nutrition and Diabetic Education    Comments:  New onset diabetes in a 32 year old. Unclear Type 1 or Type 2.   02/11/18 0952       Elisha PonderMurray, Zykiria Bruening B, PA-C 02/11/18 1146    Benjiman CorePickering, Nathan, MD 02/11/18 1524

## 2018-02-11 NOTE — Progress Notes (Signed)
Patient has had 4 blood sugars >200. Anion Gap and CO2 within normal range. Dr. Gwenlyn PerkingMadera notified.

## 2018-02-11 NOTE — ED Triage Notes (Signed)
States left ear pain and pressure x 1 week. States general malaise and dry mouth. States no alleviating or exacerbating factors at home.

## 2018-02-11 NOTE — H&P (Signed)
History and Physical    [name]:[id number] DOB: [birth year] DOA: 02/11/2018  PCP: Patient, No Pcp Per   I have briefly reviewed patients previous medical reports in [facility].  Patient coming from: home  Chief Complaint: Polyuria, polydipsia, general malaise, intermittent blurred vision and left ear fullness sensation.  HPI: Kevin Russell is a 32 year old male without PMH; who presented to the emergency department secondary to increased fatigue, polyuria, polydipsia and blurred vision (intermittently).  Patient also described a vague fullness sensation in his left ear and general malaise. No ear drainage and no pain described.  Patient is present for the last 2 weeks he has been experiencing insatiable thirst, polyuria and despite eating well feeling weak. He came to ED as requested by his wife base on those symptoms. No PCP and never seen and doctor since adulthood. Patient denies chest pain, shortness of breath, nausea, vomiting, abdominal pain, hematuria, dysuria, melena, hematochezia, headaches, focal weakness or any other complaints.  ED Course: Blood work demonstrated severe hyperglycemia (CBG 536) with mild DKA (anion gap of 21); patient receive bolus of IV fluids and was initiated on glucostabilizer. [facility] contacted to admit patient for further evaluation and management of newly diagnosed DM with DKA.  Review of Systems:  All other systems reviewed and apart from HPI, are negative.  History reviewed. No pertinent past medical history.  History reviewed. No pertinent surgical history.  Social History  reports that he has never smoked. He has never used smokeless tobacco. He reports that he does not drink alcohol or use drugs.  No Known Allergies  Family history has been reviewed in detail with patient and he denies any medical problems on any first-degree or second-degree family members.  Prior to Admission medications   Not on File    Physical  Exam: Vitals:   02/11/18 1100 02/11/18 1215 02/11/18 1230 02/11/18 1245  BP:  121/79 109/78 108/78  Pulse:  78 78 72  Resp:  17 18 11   Temp: 97.9 F (36.6 C)     TempSrc: Oral     SpO2:  100% 100% 100%  Weight:      Height:       Constitutional:  Eyes: PERTLA, lids and conjunctivae normal, no icterus, no nystagmus  ENMT: Mucous membranes were dry on exam. Posterior pharynx clear of any exudate or lesions. Fair dentition, couple missing teeth. No Thrush.  Neck: supple, no masses, no thyromegaly, no JVD Respiratory: clear to auscultation bilaterally, no wheezing, no crackles. Normal respiratory effort. No accessory muscle use.  Cardiovascular: S1 & S2 heard, regular rate and rhythm, no murmurs / rubs / gallops. No extremity edema. 2+ pedal pulses. No carotid bruits.  Abdomen: No distension, no tenderness, no masses palpated. No hepatosplenomegaly. Bowel sounds normal.  Musculoskeletal: no clubbing / cyanosis. No joint deformity upper and lower extremities. Good ROM, no contractures. Normal muscle tone.  Skin: no rashes, lesions, ulcers. No induration. Multiple tattoos on his upper extremities and neck appreciated.  Neurologic: CN 2-12 grossly intact. Sensation intact, DTR normal. Strength 5/5 in all 4 limbs.  Psychiatric: Normal judgment and insight. Alert and oriented x 3. Normal mood.     Labs on Admission: I have personally reviewed following labs and imaging studies  CBC: Recent Labs  Lab 02/11/18 0943  WBC 7.0  HGB 15.3  HCT 42.7  MCV 81.6  PLT 248   Basic Metabolic Panel: Recent Labs  Lab 02/11/18 0943 02/11/18 1107 02/11/18 1323  NA 134* 135  138  K 4.0 4.1 4.3  CL 92* 95* 102  CO2 21* 23 25  GLUCOSE 536* 451* 207*  BUN 24* 21* 17  CREATININE 0.99 0.95 0.73  CALCIUM 9.9 9.1 8.7*   Liver Function Tests: Recent Labs  Lab 02/11/18 0943  AST 24  ALT 26  ALKPHOS 117  BILITOT 1.6*  PROT 8.9*  ALBUMIN 5.0   HbA1C: Recent Labs    02/11/18 0943  HGBA1C  10.4*   CBG: Recent Labs  Lab 02/11/18 0938 02/11/18 1116 02/11/18 1318 02/11/18 1413 02/11/18 1516  GLUCAP 445* 402* 217* 201* 191*   Urine analysis:    Component Value Date/Time   COLORURINE STRAW (A) 02/11/2018 0943   APPEARANCEUR CLEAR 02/11/2018 0943   LABSPEC 1.031 (H) 02/11/2018 0943   PHURINE 5.0 02/11/2018 0943   GLUCOSEU >=500 (A) 02/11/2018 0943   HGBUR SMALL (A) 02/11/2018 0943   BILIRUBINUR NEGATIVE 02/11/2018 0943   KETONESUR 80 (A) 02/11/2018 0943   PROTEINUR NEGATIVE 02/11/2018 0943   UROBILINOGEN 0.2 06/01/2012 0916   NITRITE NEGATIVE 02/11/2018 0943   LEUKOCYTESUR NEGATIVE 02/11/2018 0943    Radiological Exams on Admission: No results found.  EKG:  None  Assessment/Plan 1-hyperglycemia/DKA: Patient with newly diagnosis of diabetes. 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 -Will check hemoglobin A1c -Follow DKA protocol, And use insulin drip. -Will make sure that the patient has achieve closure of anion gap, bicarbonate level and more than 4 CBGs on the 200 range, before transitioning him off insulin drip.. -Aggressive hydration. -Patient denies chest pain, use of illicit drugs, alcohol abuse or complaints suggesting acute infection. -After initial fluid resuscitation given in the ED he reports feeling better overall; and is denying any lightheadedness. -Follow electrolytes closely, especially potassium level.  2-hyponatremia -Pseudohyponatremia in the setting of hyperglycemia -Follow electrolytes after CBGs controlled -Continue fluid resuscitation.  Time: 60 minutes   DVT prophylaxis: lovenox   Code Status: Full code Family Communication: No family at bedside Disposition Plan: Anticipate discharge back home once his sugar is under better control and we have prove that he will be able to control levels with outpatient hypoglycemic regimen (1-2 days) Consults called: none  Admission status: inpatient, LOS > 2 midnights, stepdown      Vassie Lollarlos Zakyla Tonche MD [facility] Pager [phone]- [phone]  If 7PM-7AM, please contact night-coverage [email or web address] Password [email or web address]  02/11/2018, 3:54 PM

## 2018-02-11 NOTE — ED Notes (Signed)
Date and time results received: 02/11/18 1035 (use smartphrase ".now" to insert current time)  Test: glucose Critical Value: 536  Name of Provider Notified: murry pa  Orders Received? Or Actions Taken?: none

## 2018-02-11 NOTE — Progress Notes (Addendum)
Inpatient Diabetes Program Recommendations  AACE/ADA: New Consensus Statement on Inpatient Glycemic Control (2019)  Target Ranges:  Prepandial:   less than 140 mg/dL      Peak postprandial:   less than 180 mg/dL (1-2 hours)      Critically ill patients:  140 - 180 mg/dL  Results for Kevin Russell (MRN [id number]) as of 02/11/2018 17:32  Ref. Range 02/11/2018 09:38 02/11/2018 11:16 02/11/2018 13:18 02/11/2018 14:13 02/11/2018 15:16 02/11/2018 16:23 02/11/2018 17:21  Glucose-Capillary Latest Ref Range: 65 - 99 mg/dL 884445 (H) 166402 (H) 063217 (H) 201 (H) 191 (H) 120 (H) 102 (H)   Results for Kevin Russell (MRN [id number]) as of 02/11/2018 17:32  Ref. Range 02/11/2018 09:43  Glucose Latest Ref Range: 65 - 99 mg/dL 355536 (HH)  Hemoglobin D3UA1C Latest Ref Range: 4.8 - 5.6 % 10.4 (H)   Review of Glycemic Control Diabetes history: NO Outpatient Diabetes medications: NA Current orders for Inpatient glycemic control: IV insulin drip per Glucostabilizer  Inpatient Diabetes Program Recommendations:  Insulin - Basal: At time of transition from IV to SQ insulin, please consider ordering Lantus 10 units Q24H (based on 69 kg x 0.15 units). Correction (SSI): At time of transition from IV to SQ insulin, please consider ordering CBGs with Novolog 0-9 units Q4H. HgbA1C: A1C 10.4% on 02/11/18 indicating an average glucose of 252 mg/dl over the past 2-3 months. Will consult CM as patient has no insurance or PCP at this time. Labs: Noted in H&P, MD questions of patient has DM1. Recommend ordering a C-Peptide.  NOTE: Noted consult for Diabetes Coordinator. Diabetes Coordinator is not on campus over the weekend but available by pager daily from 8am to 5pm for questions or concerns. Ordered: Living Well with Diabetes book, RD consult, CM consult, patient education by RNs. Noted patient has no PCP or insurance; therefore, if patient is discharged on insulin, will need affordable insulin(s) (NOVOLIN NPH, 70/30,  Regular).  Thanks, Orlando PennerMarie Jandel Patriarca, RN, MSN, CDE Diabetes Coordinator Inpatient Diabetes Program [phone] (Team Pager from 8am to 5pm)

## 2018-02-12 DIAGNOSIS — E119 Type 2 diabetes mellitus without complications: Secondary | ICD-10-CM

## 2018-02-12 LAB — GLUCOSE, CAPILLARY
GLUCOSE-CAPILLARY: 123 mg/dL — AB (ref 65–99)
GLUCOSE-CAPILLARY: 212 mg/dL — AB (ref 65–99)
GLUCOSE-CAPILLARY: 238 mg/dL — AB (ref 65–99)

## 2018-02-12 LAB — HIV ANTIBODY (ROUTINE TESTING W REFLEX): HIV Screen 4th Generation wRfx: NONREACTIVE

## 2018-02-12 MED ORDER — INSULIN STARTER KIT- SYRINGES (ENGLISH)
1.0000 | Freq: Once | Status: AC
  Administered 2018-02-12: 1
  Filled 2018-02-12: qty 1

## 2018-02-12 MED ORDER — INSULIN SYRINGES (DISPOSABLE) U-100 0.5 ML MISC: 3 refills | Status: DC

## 2018-02-12 MED ORDER — INSULIN ASPART PROT & ASPART (70-30 MIX) 100 UNIT/ML ~~LOC~~ SUSP
15.0000 [IU] | Freq: Two times a day (BID) | SUBCUTANEOUS | Status: DC
  Administered 2018-02-12: 15 [IU] via SUBCUTANEOUS
  Filled 2018-02-12: qty 10

## 2018-02-12 MED ORDER — BLOOD GLUCOSE MONITOR KIT: PACK | 0 refills | Status: DC

## 2018-02-12 MED ORDER — INSULIN ASPART PROT & ASPART (70-30 MIX) 100 UNIT/ML ~~LOC~~ SUSP: 17.0000 [IU] | Freq: Two times a day (BID) | SUBCUTANEOUS | 11 refills | Status: DC

## 2018-02-12 NOTE — Discharge Instructions (Signed)
Insulin Storage and Care All insulin pens and bottles (vials) have expiration dates. Refrigerated, unopened insulin pens, insulin cartridges, and insulin vials are good until the expiration date. Do not use insulin after this date. Once insulin is opened, it should be used within a certain time period. Opened means once the rubber is punctured. Always follow the instructions that come with your insulin. Storing and caring for your insulin  If insulin is kept at room temperature, the temperature must be less than 45F (30C). Some types of insulin can be stored only at less than 48F (25C).  If insulin is kept in the refrigerator, the temperature must be between 68F and 88F (3C and 8C).  Do not freeze insulin.  Keep insulin away from direct heat or sunlight.  Throw away the insulin if it is discolored, thick, or has clumps or suspended white particles in it.  Be sure to mix cloudy insulin by rolling between your hands gently. Pens can be rocked from end to end.  Opened insulin pens should be kept at room temperature.  Always have extra insulin on hand.  Never leave insulin in your vehicle. This information is not intended to replace advice given to you by your health care provider. Make sure you discuss any questions you have with your health care provider. Document Released: 08/08/2009 Document Revised: 03/18/2016 Document Reviewed: 03/01/2013 Elsevier Interactive Patient Education  2017 [employer].   Tips for Eating Away From Home If You Have Diabetes Controlling your level of blood glucose, also known as blood sugar, can be challenging. It can be even more difficult when you do not prepare your own meals. The following tips can help you manage your diabetes when you eat away from home. Planning ahead Plan ahead if you know you will be eating away from home:  Ask your health care provider how to time meals and medicine if you are taking insulin.  Make a list of restaurants  near you that offer healthy choices. If they have a carry-out menu, take it home and plan what you will order ahead of time.  Look up the restaurant you want to eat at online. Many chain and fast-food restaurants list nutritional information online. Use this information to choose the healthiest options and to calculate how many carbohydrates will be in your meal.  Use a carbohydrate-counting book or mobile app to look up the carbohydrate content and serving size of the foods you want to eat.  Become familiar with serving sizes and learn to recognize how many servings are in a portion. This will allow you to estimate how many carbohydrates you can eat.  Free foods A "free food" is any food or drink that has less than 5 g of carbohydrates per serving. Free foods include:  Many vegetables.  Hard boiled eggs.  Nuts or seeds.  Olives.  Cheeses.  Meats.  These types of foods make good appetizer choices and are often available at salad bars. Lemon juice, vinegar, or a low-calorie salad dressing of fewer than 20 calories per serving can be used as a "free" salad dressing. Choices to reduce carbohydrates  Substitute nonfat sweetened yogurt with a sugar-free yogurt. Yogurt made from soy milk may also be used, but you will still want a sugar-free or plain option to choose a lower carbohydrate amount.  Ask your server to take away the bread basket or chips from your table.  Order fresh fruit. A salad bar often offers fresh fruit choices. Avoid canned fruit because  it is usually packed in sugar or syrup.  Order a salad, and eat it without dressing. Or, create a "free" salad dressing.  Ask for substitutions. For example, instead of [place] fries, request an order of a vegetable such as salad, green beans, or broccoli. Other tips  If you take insulin, take the insulin once your food arrives to your table. This will ensure your insulin and food are timed correctly.  Ask your server about the  portion size before your order, and ask for a take-out box if the portion has more servings than you should have. When your food comes, leave the amount you should have on the plate, and put the rest in the take-out box.  Consider splitting an entree with someone and ordering a side salad. This information is not intended to replace advice given to you by your health care provider. Make sure you discuss any questions you have with your health care provider. Document Released: 10/11/2005 Document Revised: 03/18/2016 Document Reviewed: 01/08/2014 Elsevier Interactive Patient Education  [employer].

## 2018-02-12 NOTE — Discharge Summary (Signed)
Physician Discharge Summary  Kevin Russell:[id number] DOB: May 30, 1986 DOA: 02/11/2018  PCP: Patient, No Pcp Per  Admit date: 02/11/2018 Discharge date: 02/12/2018  Time spent: 35 minutes  Recommendations for Outpatient Follow-up:  1. Follow final results of C-peptide 2. Repeat basic metabolic panel to follow electrolytes and renal function 3. Reassess CBGs and further adjust hypoglycemic regimen as needed. 4. Base on future results and response, patient would benefit of outpatient endocrinology service referral.   Discharge Diagnoses:  Active Problems:   DKA (diabetic ketoacidoses) ([place])   Diabetes mellitus, new onset ([place]) Pseudohyponatremia Blurred vision  Discharge Condition: Stable and improved.  Patient discharged home with instruction to follow-up at the [facility] clinic in the next 10 days.  Diet recommendation: Modified carbohydrate diet  Filed Weights   02/11/18 0916 02/12/18 0500  Weight: 69.9 kg (154 lb) 62.7 kg (138 lb 3.7 oz)    History of present illness:  32 year old male without PMH; who presented to the emergency department secondary to increased fatigue, polyuria, polydipsia and blurred vision (intermittently).  Patient also described a vague fullness sensation in his left ear and general malaise. No ear drainage and no pain described.  Patient is present for the last 2 weeks he has been experiencing insatiable thirst, polyuria and despite eating well feeling weak. He came to ED as requested by his wife base on those symptoms. No PCP and never seen and doctor since adulthood. Patient denies chest pain, shortness of breath, nausea, vomiting, abdominal pain, hematuria, dysuria, melena, hematochezia, headaches, focal weakness or any other complaints.  Hospital Course:  1-newly diagnosed diabetes/hyperglycemia/DKA: Patient improved drastically and faster than what was anticipated. -He received aggressive fluid resuscitation and insulin drip -Anion  Gap is closed, CO2> 20 and CBG's < 200 for more than 4 occasions  -patient is feeling good and tolerating diet without problem -A1C 10.4 -will arrange [facility] up with [facility] [place] [facility] -patient discharge on 70/30 q12 hours (17 units) -advise to follow low carb diet and to keep himself well hydrated  -a C-peptide has been ordered and pending at discharge   2-hyponatremia -pseudohyponatremia  -patient's electrolytes resolved and back to normal after correcting CBG's  3-left ear fullness/Blurred vision  -no complaints today -exam in ED demonstrated no signs of active otitis  -instructed to follow up with ophthalmologist as an outpatient    Procedures:  See below for x-ray reports.  Consultations:  Diabetes coordinator  Discharge Exam: Vitals:   02/12/18 0400 02/12/18 0800  BP: (!) 94/57 100/63  Pulse: (!) 52 (!) 56  Resp: 18 12  Temp: 97.8 F (36.6 C) 98 F (36.7 C)  SpO2: 98% 100%    General: No fever, no chest pain, no nausea, no vomiting, no abdominal pain, no shortness of breath.  Patient reports feeling significantly better and is the first time that he does not have insatiable thirst sensation. Cardiovascular: S1 and S2, no rubs, no gallops, no murmur Respiratory: Good air movement bilaterally, no wheezing, no crackles Abdomen: Soft, nontender, nondistended, positive bowel sounds Extremities: No edema, no cyanosis, no clubbing.  Discharge Instructions   Discharge Instructions    Ambulatory referral to Nutrition and Diabetic Education   Complete by:  As directed    New onset diabetes in a 32 year old. Unclear Type 1 or Type 2.   Diet Carb Modified   Complete by:  As directed    Discharge instructions   Complete by:  As directed    Stop the  use of energizer drinks and Gatorade Keep yourself well-hydrated (drinking plenty of water) Follow modified carbohydrate diets (keeping yourself between 55-60 grams of carbohydrates per day). Establish and  arrange follow up with PCP Take medications as prescribed  Monitor your blood sugar at least 3 times a day (fasting, after lunch and before bedtime). Did not skip any meals.     Allergies as of 02/12/2018   No Known Allergies     Medication List    TAKE these medications   blood glucose meter kit and supplies Kit Dispense based on patient and insurance preference. Use up to three times daily as directed. (FOR ICD-9 250.00, 250.01).   insulin aspart protamine- aspart (70-30) 100 UNIT/ML injection Commonly known as:  NOVOLOG MIX 70/30 Inject 0.17 mLs (17 Units total) into the skin 2 (two) times daily with a meal.   Insulin Syringes (Disposable) U-100 0.5 ML Misc Use to inject insulin twice a day as instructed      No Known Allergies Follow-up Information    [facility] [place].   Contact information: [place] [phone]          The results of significant diagnostics from this hospitalization (including imaging, microbiology, ancillary and laboratory) are listed below for reference.    Significant Diagnostic Studies: No results found.  Microbiology: Recent Results (from the past 240 hour(s))  MRSA PCR Screening     Status: None   Collection Time: 02/11/18 12:15 PM  Result Value Ref Range Status   MRSA by PCR NEGATIVE NEGATIVE Final    Comment:        The GeneXpert MRSA Assay (FDA approved for NASAL specimens only), is one component of a comprehensive MRSA colonization surveillance program. It is not intended to diagnose MRSA infection nor to guide or monitor treatment for MRSA infections. Performed at [facility], [address] [place], [place] [address]      Labs: Basic Metabolic Panel: Recent Labs  Lab 02/11/18 [phone] 02/11/18 1107 02/11/18 1323 02/11/18 1543 02/11/18 2054  NA 134* 135 138 138 138  K 4.0 4.1 4.3 4.0 3.4*  CL 92* 95* 102 103 102  CO2 21* '23 25 27 27  ' GLUCOSE 536* 451*  207* 151* 280*  BUN 24* 21* '17 17 16  ' CREATININE 0.99 0.95 0.73 0.80 0.84  CALCIUM 9.9 9.1 8.7* 8.9 8.5*   Liver Function Tests: Recent Labs  Lab 02/11/18 0943  AST 24  ALT 26  ALKPHOS 117  BILITOT 1.6*  PROT 8.9*  ALBUMIN 5.0   CBC: Recent Labs  Lab 02/11/18 0943  WBC 7.0  HGB 15.3  HCT 42.7  MCV 81.6  PLT 248   CBG: Recent Labs  Lab 02/11/18 1902 02/11/18 2004 02/12/18 0024 02/12/18 0333 02/12/18 0743  GLUCAP 239* 264* 238* 212* 123*    Signed:  Barton Dubois MD.  [facility] 02/12/2018, 9:27 AM

## 2018-02-12 NOTE — Progress Notes (Signed)
Inpatient Diabetes Program Recommendations  AACE/ADA: New Consensus Statement on Inpatient Glycemic Control (2015)  Target Ranges:  Prepandial:   less than 140 mg/dL      Peak postprandial:   less than 180 mg/dL (1-2 hours)      Critically ill patients:  140 - 180 mg/dL   Results for Kevin Russell, Kevin Russell (MRN [id number]) as of 02/12/2018 09:47  Ref. Range 02/11/2018 16:23 02/11/2018 17:21 02/11/2018 18:03 02/11/2018 19:02 02/11/2018 20:04 02/12/2018 00:24 02/12/2018 03:33 02/12/2018 07:43  Glucose-Capillary Latest Ref Range: 65 - 99 mg/dL 562120 (H) 130102 (H) 865102 (H) 239 (H) 264 (H) 238 (H) 212 (H) 123 (H)   Review of Glycemic Control  Diabetes history: No Outpatient Diabetes medications: NA Current orders for Inpatient glycemic control: 70/30 15 units BID, Novolog 0-15 units Q4H  Inpatient Diabetes Program Recommendations:  HgbA1C: A1C 10.4% on 02/11/18 indicating an average glucose of 252 mg/dl over the past 2-3 months.   NOTE: Spoke with patient over phone (Diabetes Coordinator not on campus over the weekend) about new diabetes diagnosis.  Discussed A1C results (10.4% on 02/11/18) and 252 mg/dl over the past 2-3 months. Discussed basic pathophysiology of DM Type 1 and 2, basic home care, importance of checking CBGs and maintaining good CBG control to prevent long-term and short-term complications. Reviewed glucose and A1C goals. Reviewed signs and symptoms of hyperglycemia and hypoglycemia along with treatment for both. Discussed impact of nutrition, exercise, stress, sickness, and medications on diabetes control. Informed patient Living Well with diabetes booklet has been ordered and encouraged patient to read through entire book when received. Informed patient that he will be prescribed Novolin 70/30 since it is more affordable. Informed patient that Novolin 70/30 [facility] per vial. Informed patient that RN should be providing him with handout information on Reli-On products and  encouraged patient to go to [employer] to get the Reli-On Prime glucometer for $9 and a box of 50 Reli-On test strips for $9. Discussed 70/30 insulin in detail (how to take it, when to take it). Asked patient to check his glucose 4 times per day (before meals and at bedtime) and to keep a log book of glucose readings and insulin taken. Explained how the doctor he follows up with can use glucose trends to continue to make insulin adjustments if needed. Patient reports that he has already given himself 2-3 insulin injections and he feels comfortable with self-injecting insulin. Discussed free [facility] education class at [facility] and informed patient that RN should provide handout on the free class with contact information.  Patient reports that [facility]. Patient verbalized understanding of information discussed and he states that he has no further questions at this time related to diabetes.   RNs to provide ongoing basic DM education at bedside with this patient and engage patient to actively check blood glucose and administer insulin injections. Talked with Brayton CavesJessie, RN and informed her of conversation with patient. Asked that she print off Reli-On handout and handout on free outpatient DM education class at [facility] that I have emailed her and to give to the patient. Also asked that she review how to draw up insulin with vial/syringe to be sure patient has a good understanding of proper technique.   Thanks, Orlando PennerMarie Ailen Strauch, RN, MSN, CDE Diabetes Coordinator Inpatient Diabetes Program [phone] (Team Pager from 8am to 5pm)

## 2018-02-13 ENCOUNTER — Telehealth: Payer: Self-pay | Admitting: *Deleted

## 2018-02-13 LAB — GLUCOSE, CAPILLARY: Glucose-Capillary: 302 mg/dL — ABNORMAL HIGH (ref 65–99)

## 2018-02-13 NOTE — Telephone Encounter (Signed)
Called patient to follow up after discharge with new DM dx and new to insulin. Spoke with patient and his wife over the phone. Patient reports that he took 70/30 15 units last night at 9pm and woke up around 4am with glucose of 63 mg/dl (w/symptoms of sweaty, shaky) which he treated and went back to sleep and woke up around 9am and glucose was 55 mg/dl. Patient ate breakfast and then took 70/30 15 units after eating. Patient questioned if he should skip the insulin or take less if he was having hypoglycemia. Explained that he may need less units of insulin depending on glycemic control and that he would need to get adjustment with dosages from a doctor. Patient has not called [facility] yet. Asked that he call as soon as we get off the phone so he can make an appointment to be screened and then to be seen with a doctor there.  Reviewed hypoglycemia along with treatment and encouraged patient to get some glucose tablets to keep with him at all times in case he has hypoglycemia. Also explained that since his glucose has been higher lately once glucose comes down toward more normal values (80-130 mg/dl) then he may have symptoms of hypoglycemia until his body resets itself to more normal glucose values.  Asked that he check glucose at least 4 times per day and to keep a log of glucose readings and how much insulin he took. Explained that the doctor he follows up with at the clinic can use the information to help with make insulin adjustment. In talking with patient he reports that he has experienced hypoglycemic symptoms (sweating, shaking, ringing in ears, blurred vision) several times over the past 10 years and he would usually eat something and feel better. Encouraged patient to let the doctor know that he has experienced hypoglycemia in the past prior to any known dx of DM. Patient verbalized understanding of information discussed and states that he has no further questions or concerns related to DM or  insulin at this time.  Thanks, Orlando PennerMarie Jurnei Latini, RN, MSN, CDE Diabetes Coordinator Inpatient Diabetes Program [phone] (Team Pager from 8am to 5pm)

## 2018-04-24 ENCOUNTER — Encounter: Payer: Self-pay | Admitting: "Endocrinology

## 2018-04-26 ENCOUNTER — Encounter (HOSPITAL_COMMUNITY): Payer: Self-pay | Admitting: Emergency Medicine

## 2018-04-26 ENCOUNTER — Other Ambulatory Visit: Payer: Self-pay

## 2018-04-26 ENCOUNTER — Emergency Department (HOSPITAL_COMMUNITY)
Admission: EM | Admit: 2018-04-26 | Discharge: 2018-04-26 | Disposition: A | Discharge status: Home / Self Care | Payer: Self-pay | Attending: Emergency Medicine | Admitting: Emergency Medicine

## 2018-04-26 DIAGNOSIS — E109 Type 1 diabetes mellitus without complications: Secondary | ICD-10-CM | POA: Insufficient documentation

## 2018-04-26 DIAGNOSIS — Z794 Long term (current) use of insulin: Secondary | ICD-10-CM | POA: Insufficient documentation

## 2018-04-26 DIAGNOSIS — R739 Hyperglycemia, unspecified: Secondary | ICD-10-CM

## 2018-04-26 HISTORY — DX: Type 2 diabetes mellitus without complications: E11.9

## 2018-04-26 LAB — BASIC METABOLIC PANEL
Anion gap: 8 (ref 5–15)
BUN: 14 mg/dL (ref 6–20)
CHLORIDE: 100 mmol/L (ref 98–111)
CO2: 30 mmol/L (ref 22–32)
CREATININE: 0.9 mg/dL (ref 0.61–1.24)
Calcium: 9.1 mg/dL (ref 8.9–10.3)
GFR calc Af Amer: >60 mL/min (ref >60)
Glucose, Bld: 343 mg/dL — ABNORMAL HIGH (ref 70–99)
POTASSIUM: 3.6 mmol/L (ref 3.5–5.1)
SODIUM: 138 mmol/L (ref 135–145)

## 2018-04-26 LAB — CBC
HEMATOCRIT: 40.1 % (ref 39.0–52.0)
Hemoglobin: 13.3 g/dL (ref 13.0–17.0)
MCH: 28.9 pg (ref 26.0–34.0)
MCHC: 33.2 g/dL (ref 30.0–36.0)
MCV: 87.2 fL (ref 78.0–100.0)
PLATELETS: 268 10*3/uL (ref 150–400)
RBC: 4.6 MIL/uL (ref 4.22–5.81)
RDW: 12.9 % (ref 11.5–15.5)
WBC: 7.9 10*3/uL (ref 4.0–10.5)

## 2018-04-26 LAB — CBG MONITORING, ED: Glucose-Capillary: 346 mg/dL — ABNORMAL HIGH (ref 70–99)

## 2018-04-26 NOTE — ED Triage Notes (Signed)
Pt reports his blood sugar was reading high on his meter for several weeks. States he takes him medication but is non compliant with diet. Denies polyuria, polydipsia, or blurred vision.

## 2018-04-28 NOTE — ED Provider Notes (Signed)
[facility] EMERGENCY DEPARTMENT Provider Note   CSN: [id number] Arrival date & time: 04/26/18  1943     History   Chief Complaint Chief Complaint  Patient presents with  . Hyperglycemia    HPI Kevin Russell is a 32 y.o. male.   Hyperglycemia  Blood sugar level PTA:  High Severity:  Mild Onset quality:  Gradual Duration:  5 days Timing:  Constant Chronicity:  New Diabetes status:  Controlled with oral medications Context: not change in medication   Relieved by:  None tried Ineffective treatments:  None tried Associated symptoms: no abdominal pain, no altered mental status, no dizziness and no nausea     Past Medical History:  Diagnosis Date  . Diabetes mellitus without complication ([place])    type 1    Patient Active Problem List   Diagnosis Date Noted  . Diabetes mellitus, new onset ([place])   . DKA (diabetic ketoacidoses) ([place]) 02/11/2018  . Facial cellulitis 04/07/2017  . Periapical abscess 04/07/2017    History reviewed. No pertinent surgical history.      Home Medications    Prior to Admission medications   Medication Sig Start Date End Date Taking? Authorizing Provider  blood glucose meter kit and supplies KIT Dispense based on patient and insurance preference. Use up to three times daily as directed. (FOR ICD-9 250.00, 250.01). 02/12/18   Barton Dubois, MD  insulin aspart protamine- aspart (NOVOLOG MIX 70/30) (70-30) 100 UNIT/ML injection Inject 0.17 mLs (17 Units total) into the skin 2 (two) times daily with a meal. 02/12/18   Barton Dubois, MD  Insulin Syringes, Disposable, U-100 0.5 ML MISC Use to inject insulin twice a day as instructed 02/12/18   Barton Dubois, MD    Family History History reviewed. No pertinent family history.  Social History Social History   Tobacco Use  . Smoking status: Never Smoker  . Smokeless tobacco: Never Used  Substance Use Topics  . Alcohol use: No  . Drug use: No     Allergies   Patient has no known  allergies.   Review of Systems Review of Systems  Gastrointestinal: Negative for abdominal pain and nausea.  Neurological: Negative for dizziness.  All other systems reviewed and are negative.    Physical Exam Updated Vital Signs BP 110/76 (BP Location: Right Arm)   Pulse 80   Temp 98.2 F (36.8 C) (Oral)   Resp 18   Ht '5\' 11"'$  (1.803 m)   Wt 61.2 kg (135 lb)   SpO2 99%   BMI 18.83 kg/m   Physical Exam  Constitutional: He is oriented to person, place, and time. He appears well-developed and well-nourished.  HENT:  Head: Normocephalic and atraumatic.  Eyes: Conjunctivae and EOM are normal.  Neck: Normal range of motion.  Cardiovascular: Normal rate.  Pulmonary/Chest: Effort normal. No respiratory distress.  Abdominal: Soft. He exhibits no distension.  Musculoskeletal: Normal range of motion. He exhibits no edema or deformity.  Neurological: He is alert and oriented to person, place, and time. No cranial nerve deficit. Coordination normal.  Skin: Skin is warm and dry.  Nursing note and vitals reviewed.    ED Treatments / Results  Labs (all labs ordered are listed, but only abnormal results are displayed) Labs Reviewed  BASIC METABOLIC PANEL - Abnormal; Notable for the following components:      Result Value   Glucose, Bld 343 (*)    All other components within normal limits  CBG MONITORING, ED - Abnormal; Notable for the  following components:   Glucose-Capillary 346 (*)    All other components within normal limits  CBC    EKG None  Radiology No results found.  Procedures Procedures (including critical care time)  Medications Ordered in ED Medications - No data to display   Initial Impression / Assessment and Plan / ED Course  I have reviewed the triage vital signs and the nursing notes.  Pertinent labs & imaging results that were available during my care of the patient were reviewed by me and considered in my medical decision making (see chart for  details).     Hyperglycemia without obvious complication. Blood sugar here not nearly what it was in the office. Needs better insulin managemnt via PCP.   Final Clinical Impressions(s) / ED Diagnoses   Final diagnoses:  Hyperglycemia    ED Discharge Orders    None       Ewelina Naves, Corene Cornea, MD 04/28/18 2127

## 2018-06-29 ENCOUNTER — Ambulatory Visit: Payer: Self-pay | Admitting: "Endocrinology

## 2018-08-21 ENCOUNTER — Other Ambulatory Visit: Payer: Self-pay

## 2018-08-21 ENCOUNTER — Emergency Department (HOSPITAL_COMMUNITY)
Admission: EM | Admit: 2018-08-21 | Discharge: 2018-08-22 | Disposition: A | Discharge status: Other Acute Inpatient Hospital | Payer: Self-pay | Attending: Emergency Medicine | Admitting: Emergency Medicine

## 2018-08-21 ENCOUNTER — Encounter (HOSPITAL_COMMUNITY): Payer: Self-pay | Admitting: Emergency Medicine

## 2018-08-21 DIAGNOSIS — Z794 Long term (current) use of insulin: Secondary | ICD-10-CM | POA: Insufficient documentation

## 2018-08-21 DIAGNOSIS — E109 Type 1 diabetes mellitus without complications: Secondary | ICD-10-CM | POA: Insufficient documentation

## 2018-08-21 DIAGNOSIS — F329 Major depressive disorder, single episode, unspecified: Secondary | ICD-10-CM | POA: Insufficient documentation

## 2018-08-21 DIAGNOSIS — R45851 Suicidal ideations: Secondary | ICD-10-CM | POA: Insufficient documentation

## 2018-08-21 DIAGNOSIS — F32A Depression, unspecified: Secondary | ICD-10-CM

## 2018-08-21 LAB — BASIC METABOLIC PANEL
ANION GAP: 11 (ref 5–15)
BUN: 15 mg/dL (ref 6–20)
CHLORIDE: 100 mmol/L (ref 98–111)
CO2: 24 mmol/L (ref 22–32)
Calcium: 9.4 mg/dL (ref 8.9–10.3)
Creatinine, Ser: 0.88 mg/dL (ref 0.61–1.24)
GFR calc Af Amer: >60 mL/min (ref >60)
Glucose, Bld: 152 mg/dL — ABNORMAL HIGH (ref 70–99)
Potassium: 4.3 mmol/L (ref 3.5–5.1)
SODIUM: 135 mmol/L (ref 135–145)

## 2018-08-21 LAB — RAPID URINE DRUG SCREEN, HOSP PERFORMED
AMPHETAMINES: NOT DETECTED
BARBITURATES: NOT DETECTED
Benzodiazepines: NOT DETECTED
Cocaine: NOT DETECTED
Opiates: NOT DETECTED
TETRAHYDROCANNABINOL: POSITIVE — AB

## 2018-08-21 LAB — CBC WITH DIFFERENTIAL/PLATELET
ABS IMMATURE GRANULOCYTES: 0.02 10*3/uL (ref 0.00–0.07)
Basophils Absolute: 0 10*3/uL (ref 0.0–0.1)
Basophils Relative: 0 %
Eosinophils Absolute: 0.1 10*3/uL (ref 0.0–0.5)
Eosinophils Relative: 1 %
HCT: 45.7 % (ref 39.0–52.0)
HEMOGLOBIN: 15.1 g/dL (ref 13.0–17.0)
IMMATURE GRANULOCYTES: 0 %
LYMPHS PCT: 32 %
Lymphs Abs: 3.1 10*3/uL (ref 0.7–4.0)
MCH: 29.5 pg (ref 26.0–34.0)
MCHC: 33 g/dL (ref 30.0–36.0)
MCV: 89.3 fL (ref 80.0–100.0)
MONO ABS: 0.8 10*3/uL (ref 0.1–1.0)
MONOS PCT: 9 %
NEUTROS ABS: 5.6 10*3/uL (ref 1.7–7.7)
NEUTROS PCT: 58 %
Platelets: 281 10*3/uL (ref 150–400)
RBC: 5.12 MIL/uL (ref 4.22–5.81)
RDW: 11.9 % (ref 11.5–15.5)
WBC: 9.6 10*3/uL (ref 4.0–10.5)
nRBC: 0 % (ref 0.0–0.2)

## 2018-08-21 LAB — CBG MONITORING, ED
GLUCOSE-CAPILLARY: 140 mg/dL — AB (ref 70–99)
Glucose-Capillary: 140 mg/dL — ABNORMAL HIGH (ref 70–99)

## 2018-08-21 LAB — ETHANOL: Alcohol, Ethyl (B): <10 mg/dL (ref <10)

## 2018-08-21 NOTE — ED Triage Notes (Signed)
Pt here IVC with [employer], when asked why he is here pt will only respond "I have no idea. Your guess is as good as mine", Pt denies HI/SI, according to IVC papers pt made suicidal comments at his wife's job, she wakes up to him standing over her, hid her car and has been acting very strange

## 2018-08-21 NOTE — ED Notes (Signed)
Pt states he has not ate all day. Pt states he has recently lost his job and been depressed and not wanted to eat. Meal provided.

## 2018-08-21 NOTE — ED Provider Notes (Signed)
[facility] EMERGENCY DEPARTMENT Provider Note   CSN: [id number] Arrival date & time: 08/21/18  1948     History   Chief Complaint Chief Complaint  Patient presents with  . V70.1    HPI MARSH HECKLER is a 32 y.o. male.  Level 5 caveat for psychiatric illness and inability to give history.  Most of history obtained from commitment papers.  Patient states "I wish I could get a gun and blow my brains out".  Wife reports that he lives in a dark room all day, sleeps all day, up all night.  He recently lost his job.  He is very depressed.  He does not interact with other people.  Commitment papers states he is a type I diabetic and has not been taking his medication.     Past Medical History:  Diagnosis Date  . Diabetes mellitus without complication ([place])    type 1    Patient Active Problem List   Diagnosis Date Noted  . Diabetes mellitus, new onset ([place])   . DKA (diabetic ketoacidoses) ([place]) 02/11/2018  . Facial cellulitis 04/07/2017  . Periapical abscess 04/07/2017    History reviewed. No pertinent surgical history.      Home Medications    Prior to Admission medications   Medication Sig Start Date End Date Taking? Authorizing Provider  blood glucose meter kit and supplies KIT Dispense based on patient and insurance preference. Use up to three times daily as directed. (FOR ICD-9 250.00, 250.01). 02/12/18   Barton Dubois, MD  insulin aspart protamine- aspart (NOVOLOG MIX 70/30) (70-30) 100 UNIT/ML injection Inject 0.17 mLs (17 Units total) into the skin 2 (two) times daily with a meal. 02/12/18   Barton Dubois, MD  Insulin Syringes, Disposable, U-100 0.5 ML MISC Use to inject insulin twice a day as instructed 02/12/18   Barton Dubois, MD    Family History History reviewed. No pertinent family history.  Social History Social History   Tobacco Use  . Smoking status: Never Smoker  . Smokeless tobacco: Never Used  Substance Use Topics  . Alcohol use: No  .  Drug use: No     Allergies   Patient has no known allergies.   Review of Systems Review of Systems  Unable to perform ROS: Psychiatric disorder     Physical Exam Updated Vital Signs BP 124/83 (BP Location: Left Arm)   Pulse 88   Temp 98.5 F (36.9 C) (Oral)   Resp 16   Ht _0  (1.676 m)   Wt 45.4 kg   SpO2 99%   BMI 16.14 kg/m   Physical Exam  Constitutional: He is oriented to person, place, and time. He appears well-developed and well-nourished.  nad  HENT:  Head: Normocephalic and atraumatic.  Eyes: Conjunctivae are normal.  Neck: Neck supple.  Cardiovascular: Normal rate and regular rhythm.  Pulmonary/Chest: Effort normal and breath sounds normal.  Abdominal: Soft. Bowel sounds are normal.  Musculoskeletal: Normal range of motion.  Neurological: He is alert and oriented to person, place, and time.  Skin: Skin is warm and dry.  Psychiatric:  Flat affect, depressed  Nursing note and vitals reviewed.    ED Treatments / Results  Labs (all labs ordered are listed, but only abnormal results are displayed) Labs Reviewed  BASIC METABOLIC PANEL - Abnormal; Notable for the following components:      Result Value   Glucose, Bld 152 (*)    All other components within normal limits  RAPID URINE DRUG  SCREEN, HOSP PERFORMED - Abnormal; Notable for the following components:   Tetrahydrocannabinol POSITIVE (*)    All other components within normal limits  CBG MONITORING, ED - Abnormal; Notable for the following components:   Glucose-Capillary 140 (*)    All other components within normal limits  CBG MONITORING, ED - Abnormal; Notable for the following components:   Glucose-Capillary 140 (*)    All other components within normal limits  CBC WITH DIFFERENTIAL/PLATELET  ETHANOL    EKG None  Radiology No results found.  Procedures Procedures (including critical care time)  Medications Ordered in ED Medications - No data to display   Initial Impression /  Assessment and Plan / ED Course  I have reviewed the triage vital signs and the nursing notes.  Pertinent labs & imaging results that were available during my care of the patient were reviewed by me and considered in my medical decision making (see chart for details).     Patient is unwilling or unable to give history.  Commitment papers say he is depressed and suicidal.  Will obtain behavioral health consult.  Probable admission.  Final Clinical Impressions(s) / ED Diagnoses   Final diagnoses:  None    ED Discharge Orders    None       Nat Christen, MD 08/21/18 2312

## 2018-08-22 ENCOUNTER — Other Ambulatory Visit: Payer: Self-pay

## 2018-08-22 ENCOUNTER — Inpatient Hospital Stay
Admission: AD | Admit: 2018-08-22 | Discharge: 2018-08-23 | DRG: 885 | Disposition: A | Discharge status: Home / Self Care | Payer: No Typology Code available for payment source | Source: Intra-hospital | Attending: Psychiatry | Admitting: Psychiatry

## 2018-08-22 ENCOUNTER — Encounter: Payer: Self-pay | Admitting: Psychiatry

## 2018-08-22 DIAGNOSIS — G4700 Insomnia, unspecified: Secondary | ICD-10-CM | POA: Diagnosis present

## 2018-08-22 DIAGNOSIS — R45851 Suicidal ideations: Secondary | ICD-10-CM | POA: Diagnosis present

## 2018-08-22 DIAGNOSIS — Z560 Unemployment, unspecified: Secondary | ICD-10-CM | POA: Diagnosis not present

## 2018-08-22 DIAGNOSIS — F419 Anxiety disorder, unspecified: Secondary | ICD-10-CM | POA: Diagnosis present

## 2018-08-22 DIAGNOSIS — E109 Type 1 diabetes mellitus without complications: Secondary | ICD-10-CM | POA: Diagnosis present

## 2018-08-22 DIAGNOSIS — F322 Major depressive disorder, single episode, severe without psychotic features: Secondary | ICD-10-CM | POA: Diagnosis not present

## 2018-08-22 DIAGNOSIS — F1220 Cannabis dependence, uncomplicated: Secondary | ICD-10-CM | POA: Diagnosis present

## 2018-08-22 DIAGNOSIS — F429 Obsessive-compulsive disorder, unspecified: Secondary | ICD-10-CM | POA: Diagnosis present

## 2018-08-22 DIAGNOSIS — E119 Type 2 diabetes mellitus without complications: Secondary | ICD-10-CM

## 2018-08-22 LAB — GLUCOSE, CAPILLARY
GLUCOSE-CAPILLARY: 259 mg/dL — AB (ref 70–99)
Glucose-Capillary: 246 mg/dL — ABNORMAL HIGH (ref 70–99)

## 2018-08-22 LAB — CBG MONITORING, ED: Glucose-Capillary: 245 mg/dL — ABNORMAL HIGH (ref 70–99)

## 2018-08-22 MED ORDER — FLUVOXAMINE MALEATE 50 MG PO TABS
50.0000 mg | ORAL_TABLET | Freq: Every day | ORAL | Status: DC
  Administered 2018-08-22: 50 mg via ORAL
  Filled 2018-08-22: qty 1

## 2018-08-22 MED ORDER — INSULIN ASPART 100 UNIT/ML ~~LOC~~ SOLN
0.0000 [IU] | Freq: Three times a day (TID) | SUBCUTANEOUS | Status: DC
  Administered 2018-08-22: 5 [IU] via SUBCUTANEOUS
  Administered 2018-08-23: 9 [IU] via SUBCUTANEOUS
  Filled 2018-08-22 (×2): qty 1

## 2018-08-22 MED ORDER — INSULIN ASPART PROT & ASPART (70-30 MIX) 100 UNIT/ML ~~LOC~~ SUSP
17.0000 [IU] | Freq: Two times a day (BID) | SUBCUTANEOUS | Status: DC
  Administered 2018-08-22: 17 [IU] via SUBCUTANEOUS
  Filled 2018-08-22: qty 10

## 2018-08-22 MED ORDER — TRAZODONE HCL 100 MG PO TABS: 100.0000 mg | ORAL_TABLET | Freq: Every evening | ORAL | Status: DC | PRN

## 2018-08-22 MED ORDER — ALUM & MAG HYDROXIDE-SIMETH 200-200-20 MG/5ML PO SUSP: 30.0000 mL | ORAL | Status: DC | PRN

## 2018-08-22 MED ORDER — HYDROXYZINE HCL 50 MG PO TABS: 50.0000 mg | ORAL_TABLET | Freq: Three times a day (TID) | ORAL | Status: DC | PRN

## 2018-08-22 MED ORDER — INSULIN ASPART 100 UNIT/ML ~~LOC~~ SOLN
0.0000 [IU] | Freq: Every day | SUBCUTANEOUS | Status: DC
  Administered 2018-08-22: 2 [IU] via SUBCUTANEOUS

## 2018-08-22 MED ORDER — GLUCERNA SHAKE PO LIQD
237.0000 mL | Freq: Three times a day (TID) | ORAL | Status: DC
  Administered 2018-08-22: 237 mL via ORAL

## 2018-08-22 MED ORDER — MAGNESIUM HYDROXIDE 400 MG/5ML PO SUSP: 30.0000 mL | Freq: Every day | ORAL | Status: DC | PRN

## 2018-08-22 MED ORDER — MIRTAZAPINE 15 MG PO TABS: 15.0000 mg | ORAL_TABLET | Freq: Every day | ORAL | Status: DC

## 2018-08-22 MED ORDER — ARIPIPRAZOLE 10 MG PO TABS
10.0000 mg | ORAL_TABLET | Freq: Every day | ORAL | Status: DC
  Administered 2018-08-22 – 2018-08-23 (×2): 10 mg via ORAL
  Filled 2018-08-22 (×2): qty 1

## 2018-08-22 MED ORDER — ACETAMINOPHEN 325 MG PO TABS: 650.0000 mg | ORAL_TABLET | Freq: Four times a day (QID) | ORAL | Status: DC | PRN

## 2018-08-22 NOTE — Progress Notes (Signed)
Inpatient Diabetes Program Recommendations  AACE/ADA: New Consensus Statement on Inpatient Glycemic Control (2015)  Target Ranges:  Prepandial:   less than 140 mg/dL      Peak postprandial:   less than 180 mg/dL (1-2 hours)      Critically ill patients:  140 - 180 mg/dL    Results for MAHKAI, FANGMAN (MRN [id number]) as of 08/22/2018 15:02  Ref. Range 08/21/2018 19:55 08/21/2018 21:56 08/22/2018 00:57  Glucose-Capillary Latest Ref Range: 70 - 99 mg/dL 409 (H) 811 (H) 914 (H)    Admit with: IVC/ Suicidal Ideation  History: Type 1 Diabetes   Home DM Meds: 70/30 Insulin- 17 units BID  Current Orders: 70/30 Insulin- 17 units BID      Novolog Sensitive Correction Scale/ SSI (0-9 units) TID AC + HS     Note Novolog SSI and 70/30 Insulin to both start at 5pm tonight.  Please make sure pt receives his Insulin this evening, as pt has Type 1 Diabetes and does not make endogenous insulin.  Can quickly develop DKA when insulins are held.  Will follow Daily and make recommendations as needed during hospitalization.   --Will follow patient during hospitalization--  Ambrose Finland RN, MSN, CDE Diabetes Coordinator Inpatient Glycemic Control Team Team Pager: [phone] (8a-5p)

## 2018-08-22 NOTE — ED Notes (Signed)
Pt given breakfast tray. Pt resting at this time.  

## 2018-08-22 NOTE — H&P (Signed)
Psychiatric Admission Assessment Adult  Patient Identification: MATHEU PLOEGER MRN:  [id number] Date of Evaluation:  08/22/2018 Chief Complaint:  depression Principal Diagnosis: Major depressive disorder, single episode, severe without psychotic features ([place]) Diagnosis:   Patient Active Problem List   Diagnosis Date Noted  . Major depressive disorder, single episode, severe without psychotic features ([place]) [F32.2] 08/22/2018    Priority: High  . Suicidal ideation [R45.851] 08/22/2018  . Diabetes mellitus without complication ([place]) [X91.4] 08/22/2018  . Cannabis use disorder, moderate, dependence ([place]) [F12.20] 08/22/2018  . Diabetes mellitus, new onset ([place]) [E11.9]   . DKA (diabetic ketoacidoses) ([place]) [E11.10] 02/11/2018  . Facial cellulitis [N82.956] 04/07/2017  . Periapical abscess [K04.7] 04/07/2017   History of Present Illness:   Identifying data. Mr. Skeet is a 32 year old male with a history of untreated depression and anxiety.  Chief complaint. "I was in the bathroom."  History of present illness. Information was obtained from the patient and thechart. He was brought to [facility] ER by the police. His wife took IVC after the patient became increasingly depressed, suspicious and strange, hiding her car, and threatening to "blow his brains out". He does not have access to guns. He reports that he has been depressed most of his life but it has gotten worse over time. He has never been treated. He was diagnosed with type DM in April 2019 and lost his job at [employer] while adjusting to treatment. He has been depressed, insomniac, with poor appetite, losing wight, feeling guilty hopeless worthless. His energy and concentration have been low. He has been isolating himself, staying in his room day and night with the lights off. He has been short and argumentative with his wife. He has been suspicious that she might leave him and ruminating over why she would marry him at all in the  first place. Very aware of his looks. [place] is embarrassing. He has bad teath and thinks about it all the time. He denies psychotic synmptoms. No panic attacks or social anxiety. No PTSD symptoms but severe OCD with constant worries and ruminations, worse case scenarios. Organizes things all the time. Denies drugs or alcohol.  Past psychiatric history. No treatments or suicide attempts.  Family psychiatric history. None reported.  Social history. Has 3 children with 2 different women. He has been married to his wife for 3 years. Lost a job at [employer] due to their "point system" where no doctor's note counts when hospitalized for new onset diabetes.  Total Time spent with patient: 1 hour  Is the patient at risk to self? Yes.    Has the patient been a risk to self in the past 6 months? No.  Has the patient been a risk to self within the distant past? No.  Is the patient a risk to others? No.  Has the patient been a risk to others in the past 6 months? No.  Has the patient been a risk to others within the distant past? No.   Prior Inpatient Therapy:   Prior Outpatient Therapy:    Alcohol Screening: 1. How often do you have a drink containing alcohol?: Never 2. How many drinks containing alcohol do you have on a typical day when you are drinking?: 1 or 2 3. How often do you have six or more drinks on one occasion?: Never AUDIT-C Score: 0 4. How often during the last year have you found that you were not able to stop drinking once you had started?: Never 5. How often during  the last year have you failed to do what was normally expected from you becasue of drinking?: Never 6. How often during the last year have you needed a first drink in the morning to get yourself going after a heavy drinking session?: Never 7. How often during the last year have you had a feeling of guilt of remorse after drinking?: Never 8. How often during the last year have you been unable to remember what happened the  night before because you had been drinking?: Never 9. Have you or someone else been injured as a result of your drinking?: No 10. Has a relative or friend or a doctor or another health worker been concerned about your drinking or suggested you cut down?: No Alcohol Use Disorder Identification Test Final Score (AUDIT): 0 Intervention/Follow-up: AUDIT Score <7 follow-up not indicated Substance Abuse History in the last 12 months:  No. Consequences of Substance Abuse: NA Previous Psychotropic Medications: No  Psychological Evaluations: No  Past Medical History:  Past Medical History:  Diagnosis Date  . Diabetes mellitus without complication ([place])    type 1   History reviewed. No pertinent surgical history. Family History: History reviewed. No pertinent family history.  Tobacco Screening: Have you used any form of tobacco in the last 30 days? (Cigarettes, Smokeless Tobacco, Cigars, and/or Pipes): No Social History:  Social History   Substance and Sexual Activity  Alcohol Use No     Social History   Substance and Sexual Activity  Drug Use No    Additional Social History:                           Allergies:  No Known Allergies Lab Results:  Results for orders placed or performed during the hospital encounter of 08/22/18 (from the past 48 hour(s))  Glucose, capillary     Status: Abnormal   Collection Time: 08/22/18  3:09 PM  Result Value Ref Range   Glucose-Capillary 259 (H) 70 - 99 mg/dL    Blood Alcohol level:  Lab Results  Component Value Date   ETH <10 81/82/9937    Metabolic Disorder Labs:  Lab Results  Component Value Date   HGBA1C 10.4 (H) 02/11/2018   MPG 251.78 02/11/2018   No results found for: PROLACTIN No results found for: CHOL, TRIG, HDL, CHOLHDL, VLDL, LDLCALC  Current Medications: Current Facility-Administered Medications  Medication Dose Route Frequency Provider Last Rate Last Dose  . acetaminophen (TYLENOL) tablet 650 mg  650 mg Oral  Q6H PRN Kevon Tench B, MD      . alum & mag hydroxide-simeth (MAALOX/MYLANTA) 200-200-20 MG/5ML suspension 30 mL  30 mL Oral Q4H PRN Etna Forquer B, MD      . ARIPiprazole (ABILIFY) tablet 10 mg  10 mg Oral Daily Taleen Prosser B, MD      . feeding supplement (GLUCERNA SHAKE) (GLUCERNA SHAKE) liquid 237 mL  237 mL Oral TID BM Jermel Artley B, MD      . fluvoxaMINE (LUVOX) tablet 50 mg  50 mg Oral QHS Arin Vanosdol B, MD      . hydrOXYzine (ATARAX/VISTARIL) tablet 50 mg  50 mg Oral TID PRN Reis Goga B, MD      . insulin aspart (novoLOG) injection 0-5 Units  0-5 Units Subcutaneous QHS Davinder Haff B, MD      . insulin aspart (novoLOG) injection 0-9 Units  0-9 Units Subcutaneous TID WC Kyndahl Jablon B, MD      .  insulin aspart protamine- aspart (NOVOLOG MIX 70/30) injection 17 Units  17 Units Subcutaneous BID WC Paulina Muchmore B, MD      . magnesium hydroxide (MILK OF MAGNESIA) suspension 30 mL  30 mL Oral Daily PRN Sevon Rotert B, MD      . traZODone (DESYREL) tablet 100 mg  100 mg Oral QHS PRN Treylin Burtch B, MD       PTA Medications: Medications Prior to Admission  Medication Sig Dispense Refill Last Dose  . blood glucose meter kit and supplies KIT Dispense based on patient and insurance preference. Use up to three times daily as directed. (FOR ICD-9 250.00, 250.01). 1 each 0   . insulin aspart protamine- aspart (NOVOLOG MIX 70/30) (70-30) 100 UNIT/ML injection Inject 0.17 mLs (17 Units total) into the skin 2 (two) times daily with a meal. 10 mL 11   . Insulin Syringes, Disposable, U-100 0.5 ML MISC Use to inject insulin twice a day as instructed 100 each 3     Musculoskeletal: Strength & Muscle Tone: within normal limits Gait & Station: normal Patient leans: N/A  Psychiatric Specialty Exam: Physical Exam  Nursing note and vitals reviewed. Constitutional: He is oriented to person, place, and time. He appears  well-developed and well-nourished.  HENT:  Head: Normocephalic and atraumatic.  Eyes: Pupils are equal, round, and reactive to light. Conjunctivae and EOM are normal.  Neck: Normal range of motion. Neck supple.  Cardiovascular: Normal rate and regular rhythm.  Respiratory: Effort normal and breath sounds normal.  GI: Bowel sounds are normal.  Musculoskeletal: Normal range of motion.  Neurological: He is alert and oriented to person, place, and time.  Skin: Skin is warm and dry.  Psychiatric: His speech is normal. His mood appears anxious. His affect is labile. He is withdrawn. Thought content is paranoid. Cognition and memory are normal. He expresses impulsivity. He exhibits a depressed mood. He expresses suicidal ideation.    Review of Systems  Constitutional: Positive for weight loss.  Neurological: Negative.   Psychiatric/Behavioral: Positive for depression and suicidal ideas. The patient is nervous/anxious and has insomnia.   All other systems reviewed and are negative.   Blood pressure 116/89, pulse 72, temperature 97.7 F (36.5 C), temperature source Oral, resp. rate 18, height '5\' 6"'  (1.676 m), weight 58.1 kg, SpO2 100 %.Body mass index is 20.66 kg/m.  See SRA                                                  Sleep:       Treatment Plan Summary: Daily contact with patient to assess and evaluate symptoms and progress in treatment and Medication management   Mr. Shadowens is a 32 year old male with a history of untreated depression and anxiety admitted for voicing suicidal threats with a plan.   #Suicidal ideation -patient able to contract for safety in the hospital  #Mood -start Luvox 50 mg daily for OCD and depression -start Abilify 10 mg daily for augmentation/mood stabilization -Trazodone 100 mg nightly  #DM -Novolog 70/30 17 units BID -SSI, ADA diet -Glucerna TID, has been losing weight -reports taking Novolog 70/30 15 units TID -input from  diabetes nurse coordinator is greatly appreciated  #Labs -lipid panel, TSH, A1C -EKG  #Disposition -discharge to home with the wife -follow up with [facility]   Observation Level/Precautions:  15 minute  checks  Laboratory:  CBC Chemistry Profile UDS UA  Psychotherapy:    Medications:    Consultations:    Discharge Concerns:    Estimated LOS:  Other:     Physician Treatment Plan for Primary Diagnosis: Major depressive disorder, single episode, severe without psychotic features ([place]) Long Term Goal(s): Improvement in symptoms so as ready for discharge  Short Term Goals: Ability to identify changes in lifestyle to reduce recurrence of condition will improve, Ability to verbalize feelings will improve, Ability to disclose and discuss suicidal ideas, Ability to demonstrate self-control will improve, Ability to identify and develop effective coping behaviors will improve, Ability to maintain clinical measurements within normal limits will improve, Compliance with prescribed medications will improve and Ability to identify triggers associated with substance abuse/mental health issues will improve  Physician Treatment Plan for Secondary Diagnosis: Principal Problem:   Major depressive disorder, single episode, severe without psychotic features ([place]) Active Problems:   Suicidal ideation   Diabetes mellitus without complication ([place])   Cannabis use disorder, moderate, dependence ([place])  Long Term Goal(s): Improvement in symptoms so as ready for discharge  Short Term Goals: NA  I certify that inpatient services furnished can reasonably be expected to improve the patient's condition.    Orson Slick, MD 10/29/20194:36 PM

## 2018-08-22 NOTE — BH Assessment (Signed)
Patient has been accepted to [facility].  Accepting physician is Dr. Jennet Maduro.  Attending Physician will be Dr. Alanson Puls.  Patient has been assigned to room 320, by [facility] Charge Nurse T'Yawn.  Call report to [phone].  Representative/Transfer Coordinator is Torres Hardenbrook Patient pre-admitted by [facility] Patient Access Edwyna Shell)  [facility] Staff Carney Bern, Social Work Disposition) made aware of acceptance.

## 2018-08-22 NOTE — ED Notes (Signed)
Pt escorted by [employer] to [place]

## 2018-08-22 NOTE — ED Notes (Signed)
Pt allowed ph call, began to get loud and argumentative with wife on phone

## 2018-08-22 NOTE — Tx Team (Signed)
Initial Treatment Plan 08/22/2018 3:14 PM Kevin Russell UJW:[id number]    PATIENT STRESSORS: Financial difficulties Occupational concerns   PATIENT STRENGTHS: Average or above average intelligence Communication skills Supportive family/friends Work skills   PATIENT IDENTIFIED PROBLEMS: Depression  Lost job                   DISCHARGE CRITERIA:  Ability to meet basic life and health needs Adequate post-discharge living arrangements Medical problems require only outpatient monitoring Verbal commitment to aftercare and medication compliance  PRELIMINARY DISCHARGE PLAN: Attend aftercare/continuing care group Return to previous living arrangement  PATIENT/FAMILY INVOLVEMENT: This treatment plan has been presented to and reviewed with the patient, Kevin Russell, and/or family member,   The patient and family have been given the opportunity to ask questions and make suggestions.  Leonarda Salon, RN 08/22/2018, 3:14 PM

## 2018-08-22 NOTE — ED Notes (Signed)
Donell Sievert, PA, recommends overnight observation for safety with psych re-evaluation in the a.m Beth, RN, informed of disposition.

## 2018-08-22 NOTE — ED Notes (Signed)
Pt wife called to ck on him, advised pt is sleeping at this time, wife requests the information she shares to be confidential, wife states pt gets very angry at times and she is concerned for him, she states he was recently fired from his job at [employer] where she works as well, she states the pt was angry with her and didn't go to work or call in so he was terminated, she further states he will get mad and keeps her up all night arguing; wife states the pt's father committed suicide when the pt was 32 y/o by shooting himself in the head and pt's father used to make the same types of stmts as the pt has been making about "blowing his brains out", per wife pt's father sent a picture of a gun to his children one day and the next shot himself in the head, wife further reports pt's Mother has been committed for SI in the past and siblings make SI comments as well; wife states she has attempted to get him help but he refuses to go to see a Dr because he feels the staff will go home and discuss "his problems" with their families and then everyone knows; wife states she feels inpatient placement is best for pt bc she does not feel he will follow-up with anything outpatient as she has been attempting that on her own, wife states that they have recently had martial issues due to his anger, she states he blames her for everything including his termination from his job; wife states that when he called her this am he was angry with her because he "is stuck here in this place and she's not even home", wife states she stayed with her Father last night for fear pt would be discharged and he would be angry bc she took out the IVC paperwork; explained to wife [facility] will re-evaluate this morning and will make a recommendation for treatment, explained visitation hours and [facility] pt guidelines

## 2018-08-22 NOTE — BHH Suicide Risk Assessment (Signed)
[facility] Admission Suicide Risk Assessment   Nursing information obtained from:  Patient Demographic factors:  Male, Caucasian, Unemployed Current Mental Status:  NA Loss Factors:  NA Historical Factors:  NA Risk Reduction Factors:  Living with another person, especially a relative, Sense of responsibility to family  Total Time spent with patient: 1 hour Principal Problem: Major depressive disorder, single episode, severe without psychotic features (HCC) Diagnosis:   Patient Active Problem List   Diagnosis Date Noted  . Major depressive disorder, single episode, severe without psychotic features (HCC) [F32.2] 08/22/2018    Priority: High  . Suicidal ideation [R45.851] 08/22/2018  . Diabetes mellitus without complication (HCC) [E11.9] 08/22/2018  . Cannabis use disorder, moderate, dependence (HCC) [F12.20] 08/22/2018  . Diabetes mellitus, new onset (HCC) [E11.9]   . DKA (diabetic ketoacidoses) (HCC) [E11.10] 02/11/2018  . Facial cellulitis [L03.211] 04/07/2017  . Periapical abscess [K04.7] 04/07/2017   Subjective Data: suicidal ideation.  Continued Clinical Symptoms:  Alcohol Use Disorder Identification Test Final Score (AUDIT): 0 The "Alcohol Use Disorders Identification Test", Guidelines for Use in Primary Care, Second Edition.  World Science writer [facility]). Score between 0-7:  no or low risk or alcohol related problems. Score between 8-15:  moderate risk of alcohol related problems. Score between 16-19:  high risk of alcohol related problems. Score 20 or above:  warrants further diagnostic evaluation for alcohol dependence and treatment.   CLINICAL FACTORS:   Depression:   Impulsivity Insomnia Recent sense of peace/wellbeing Severe Medical Diagnoses and Treatments/Surgeries   Musculoskeletal: Strength & Muscle Tone: within normal limits Gait & Station: normal Patient leans: N/A  Psychiatric Specialty Exam: Physical Exam  Nursing note and vitals  reviewed. Psychiatric: His speech is normal. His mood appears anxious. He is withdrawn. Thought content is paranoid. Cognition and memory are normal. He expresses impulsivity. He exhibits a depressed mood. He expresses suicidal ideation.    Review of Systems  Constitutional: Positive for weight loss.  Neurological: Negative.   Psychiatric/Behavioral: Positive for depression and suicidal ideas. The patient is nervous/anxious and has insomnia.   All other systems reviewed and are negative.   Blood pressure 116/89, pulse 72, temperature 97.7 F (36.5 C), temperature source Oral, resp. rate 18, height 5\' 6"  (1.676 m), weight 58.1 kg, SpO2 100 %.Body mass index is 20.66 kg/m.  General Appearance: Casual  Eye Contact:  Good  Speech:  Clear and Coherent  Volume:  Normal  Mood:  Irritable  Affect:  Blunt  Thought Process:  Goal Directed and Descriptions of Associations: Intact  Orientation:  Full (Time, Place, and Person)  Thought Content:  WDL  Suicidal Thoughts:  Yes.  with intent/plan  Homicidal Thoughts:  No  Memory:  Immediate;   Fair Recent;   Fair Remote;   Fair  Judgement:  Impaired  Insight:  Shallow  Psychomotor Activity:  Psychomotor Retardation  Concentration:  Concentration: Fair and Attention Span: Fair  Recall:  [employer] of Knowledge:  Fair  Language:  Fair  Akathisia:  No  Handed:  Right  AIMS (if indicated):     Assets:  Communication Skills Desire for Improvement Housing Intimacy Resilience Social Support  ADL's:  Intact  Cognition:  WNL  Sleep:         COGNITIVE FEATURES THAT CONTRIBUTE TO RISK:  None    SUICIDE RISK:   Moderate:  Frequent suicidal ideation with limited intensity, and duration, some specificity in terms of plans, no associated intent, good self-control, limited dysphoria/symptomatology, some risk factors present, and  identifiable protective factors, including available and accessible social support.  PLAN OF CARE: hospital  admission, medication management, discharge planning.  Mr. Propes is a 32 year old male with a history of untreated depression and anxiety admitted for voicing suicidal threats with a plan.   #Suicidal ideation -patient able to contract for safety in the hospital  #Mood -start Luvox 50 mg daily for OCD and depression -start Abilify 10 mg daily for augmentation/mood stabilization -Trazodone 100 mg nightly  #DM -Novolog 70/30 17 units BID -SSI, ADA diet -Glucerna TID, has been losing weight -reports taking Novolog 70/30 15 units TID -input from diabetes nurse coordinator is greatly appreciated  #Labs -lipid panel, TSH, A1C -EKG  #Disposition -discharge to home with the wife -follow up with [facility]  I certify that inpatient services furnished can reasonably be expected to improve the patient's condition.   Kristine Linea, MD 08/22/2018, 4:28 PM

## 2018-08-22 NOTE — BH Assessment (Signed)
Tele Assessment Note   Patient Name: Kevin Russell MRN: [id number] Referring Physician: Dr. Adriana Simas Location of Patient: Kevin Russell ED Bed: [id number] Location of Provider: Behavioral Health TTS Department  Kevin Russell is an 32 y.o. male presenting under IVC, SI with plan to blow his brains out. During assessment patient denied all allegations. Patient denied SI, HI and psychosis. When asked why are you in the ER, patient stated, "I have no idea". Most of history obtained from commitment papers. PER IVC, patient states "I wish I could get a gun and blow my brains out".  Patient made suicidal comments at his wife's job, she wakes up to him standing over her, hid her car and has been acting very strange. Wife reports that he lives in a dark room all day, sleeps all day, up all night.  He recently lost his job.  He is very depressed.  He does not interact with other people.  Commitment papers states he is a type I diabetic and has not been taking his medication. Patient denied information on commitment papers. Patient was calm during assessment. Patient reported loosing his job and being depressed. Patient offered no other additional information.   UDS +marijuana. ETOH negative.  Diagnosis: Major Depressive Disorder  Past Medical History:  Past Medical History:  Diagnosis Date  . Diabetes mellitus without complication (HCC)    type 1    History reviewed. No pertinent surgical history.  Family History: History reviewed. No pertinent family history.  Social History:  reports that he has never smoked. He has never used smokeless tobacco. He reports that he does not drink alcohol or use drugs.  Additional Social History:  Alcohol / Drug Use Pain Medications: see MAR Prescriptions: see MAR Over the Counter: see MAR  CIWA: CIWA-Ar BP: 124/83 Pulse Rate: 88 COWS:    Allergies: No Known Allergies  Home Medications:  (Not in a hospital admission)  OB/GYN Status:  No LMP for male  patient.  General Assessment Data Location of Assessment: [facility] TTS Assessment: In system Is this a Tele or Face-to-Face Assessment?: Tele Assessment Is this an Initial Assessment or a Re-assessment for this encounter?: Initial Assessment Patient Accompanied by:: N/A Language Other than English: No Living Arrangements: (family home) What gender do you identify as?: Male Marital status: Married Pregnancy Status: No Living Arrangements: Spouse/significant other Can pt return to current living arrangement?: Yes Admission Status: Involuntary Petitioner: (wife) Is patient capable of signing voluntary admission?: (IVC) Referral Source: Self/Family/Friend     Crisis Care Plan Living Arrangements: Spouse/significant other Legal Guardian: (self) Name of Psychiatrist: (none) Name of Therapist: (none)  Education Status Is patient currently in school?: No Is the patient employed, unemployed or receiving disability?: Unemployed  Risk to self with the past 6 months Suicidal Ideation: No Has patient been a risk to self within the past 6 months prior to admission? : No Suicidal Intent: No Has patient had any suicidal intent within the past 6 months prior to admission? : No Is patient at risk for suicide?: No Suicidal Plan?: No Has patient had any suicidal plan within the past 6 months prior to admission? : No Access to Means: No Previous Attempts/Gestures: No Triggers for Past Attempts: None known Intentional Self Injurious Behavior: None Family Suicide History: No Recent stressful life event(s): (denied) Persecutory voices/beliefs?: No Depression: No Depression Symptoms: (denied) Substance abuse history and/or treatment for substance abuse?: No  Risk to Others within the past 6 months Homicidal Ideation: No Does patient  have any lifetime risk of violence toward others beyond the six months prior to admission? : No Thoughts of Harm to Others: No Current Homicidal Intent:  No Current Homicidal Plan: No Access to Homicidal Means: No History of harm to others?: No Assessment of Violence: None Noted Does patient have access to weapons?: No Criminal Charges Pending?: No Does patient have a court date: No Is patient on probation?: No  Psychosis Hallucinations: None noted Delusions: None noted  Mental Status Report Appearance/Hygiene: Unremarkable Eye Contact: Fair Motor Activity: Unremarkable Speech: Logical/coherent Level of Consciousness: Alert Mood: Sad, Depressed Affect: Sad, Depressed Anxiety Level: Minimal Thought Processes: Unable to Assess Judgement: Unable to Assess Orientation: Person, Place, Time, Situation Obsessive Compulsive Thoughts/Behaviors: None  Cognitive Functioning Concentration: Fair Memory: Unable to Assess Is patient IDD: No Insight: Unable to Assess Impulse Control: Poor Appetite: Good Have you had any weight changes? : No Change Sleep: Decreased(sleep whenever I can) Total Hours of Sleep: (8-10) Vegetative Symptoms: (denied)  ADLScreening [facility] Assessment Services) Patient's cognitive ability adequate to safely complete daily activities?: Yes Patient able to express need for assistance with ADLs?: Yes Independently performs ADLs?: Yes (appropriate for developmental age)  Prior Inpatient Therapy Prior Inpatient Therapy: No  Prior Outpatient Therapy Prior Outpatient Therapy: No Does patient have an ACCT team?: No Does patient have Intensive In-House Services?  : No Does patient have [facility] services? : No Does patient have P4CC services?: No  ADL Screening (condition at time of admission) Patient's cognitive ability adequate to safely complete daily activities?: Yes Patient able to express need for assistance with ADLs?: Yes Independently performs ADLs?: Yes (appropriate for developmental age)             Merchant navy officer (For Healthcare) Does Patient Have a Medical Advance Directive?: No Would  patient like information on creating a medical advance directive?: No - Patient declined          Disposition:  Disposition Initial Assessment Completed for this Encounter: Yes  Donell Sievert, PA, recommends overnight observation for safety with psych re-evaluation in the a.m RN informed of disposition.   This service was provided via telemedicine using a 2-way, interactive audio and video technology.  Names of all persons participating in this telemedicine service and their role in this encounter. Name: Mohannad Olivero Role: patient  Name: Al Corpus, [facility] Role: TTS Clinician  Name:  Role:   Name:  Role:     Burnetta Sabin 08/22/2018 2:29 AM

## 2018-08-22 NOTE — Progress Notes (Signed)
Patients affect is  sad but  cooperative during admission assessment. Patient denies SI/HI at this time. Patient denies AVH. Patient informed of fall risk status, fall risk assessed "low" at this time. Patient oriented to unit/staff/room. Patient denies any questions/concerns at this time. Patient safe on unit with Q15 minute checks for safety. Skin assessment and body search done,no contraband found.

## 2018-08-22 NOTE — ED Notes (Signed)
Pt requesting to use ph, advised he can call in the am after 8

## 2018-08-22 NOTE — ED Notes (Signed)
Called for transport of Pt to [facility].

## 2018-08-22 NOTE — ED Notes (Signed)
TTS in process at this time  

## 2018-08-22 NOTE — ED Notes (Signed)
Pt crying and requesting to see nurse, pt wants to use phone, provided pt with [facility] visitation guidelines and told he can make a call after 8am, pt very agitated and argumentative, attempted to redirect and explain, security and [employer] officer present

## 2018-08-23 LAB — LIPID PANEL
CHOL/HDL RATIO: 4.2 ratio
CHOLESTEROL: 220 mg/dL — AB (ref 0–200)
HDL: 52 mg/dL (ref >40)
LDL Cholesterol: 141 mg/dL — ABNORMAL HIGH (ref 0–99)
Triglycerides: 135 mg/dL (ref <150)
VLDL: 27 mg/dL (ref 0–40)

## 2018-08-23 LAB — HEMOGLOBIN A1C
HEMOGLOBIN A1C: 11.6 % — AB (ref 4.8–5.6)
MEAN PLASMA GLUCOSE: 286.22 mg/dL

## 2018-08-23 LAB — GLUCOSE, CAPILLARY
GLUCOSE-CAPILLARY: 161 mg/dL — AB (ref 70–99)
Glucose-Capillary: 393 mg/dL — ABNORMAL HIGH (ref 70–99)

## 2018-08-23 LAB — TSH: TSH: 1.756 u[IU]/mL (ref 0.350–4.500)

## 2018-08-23 MED ORDER — INSULIN ASPART 100 UNIT/ML ~~LOC~~ SOLN
0.0000 [IU] | Freq: Three times a day (TID) | SUBCUTANEOUS | Status: DC
  Administered 2018-08-23: 3 [IU] via SUBCUTANEOUS

## 2018-08-23 MED ORDER — RISPERIDONE 1 MG PO TABS: 2.0000 mg | ORAL_TABLET | Freq: Two times a day (BID) | ORAL | Status: DC

## 2018-08-23 MED ORDER — RISPERIDONE 2 MG PO TABS: 2.0000 mg | ORAL_TABLET | Freq: Two times a day (BID) | ORAL | 3 refills | Status: DC

## 2018-08-23 MED ORDER — FLUVOXAMINE MALEATE 50 MG PO TABS: 100.0000 mg | ORAL_TABLET | Freq: Every day | ORAL | Status: DC

## 2018-08-23 MED ORDER — ARIPIPRAZOLE 10 MG PO TABS: 10.0000 mg | ORAL_TABLET | Freq: Every day | ORAL | Status: DC

## 2018-08-23 MED ORDER — ARIPIPRAZOLE 10 MG PO TABS: 10.0000 mg | ORAL_TABLET | Freq: Every day | ORAL | 3 refills | Status: DC

## 2018-08-23 MED ORDER — INSULIN ASPART PROT & ASPART (70-30 MIX) 100 UNIT/ML ~~LOC~~ SUSP: 20.0000 [IU] | Freq: Two times a day (BID) | SUBCUTANEOUS | 11 refills | Status: DC

## 2018-08-23 MED ORDER — TRAZODONE HCL 100 MG PO TABS: 100.0000 mg | ORAL_TABLET | Freq: Every evening | ORAL | 3 refills | Status: DC | PRN

## 2018-08-23 MED ORDER — INSULIN ASPART PROT & ASPART (70-30 MIX) 100 UNIT/ML ~~LOC~~ SUSP: 20.0000 [IU] | Freq: Two times a day (BID) | SUBCUTANEOUS | Status: DC

## 2018-08-23 MED ORDER — FLUVOXAMINE MALEATE 100 MG PO TABS: 100.0000 mg | ORAL_TABLET | Freq: Every day | ORAL | 3 refills | Status: DC

## 2018-08-23 NOTE — Plan of Care (Signed)
Pt was met in the room sleeping and self-isolating at the beginning of the shift. He declined this writer's invitation to attend group session and socialize in the milieu. He denies SI/HI/AVH. He stated that he was depressed and "didn't feel like doing anything". Pt encouraged to seek staff for support. Pt notified of the schedule for the rest of the day per snack time, medication administration  and group. Will continue to monitor.  Problem: Education: Goal: Emotional status will improve Outcome: Progressing Goal: Mental status will improve Outcome: Progressing Goal: Verbalization of understanding the information provided will improve Outcome: Progressing   Problem: Health Behavior/Discharge Planning: Goal: Compliance with treatment plan for underlying cause of condition will improve Outcome: Progressing   Problem: Education: Goal: Utilization of techniques to improve thought processes will improve Outcome: Progressing Goal: Knowledge of the prescribed therapeutic regimen will improve Outcome: Progressing   Problem: Coping: Goal: Coping ability will improve Outcome: Progressing Goal: Will verbalize feelings Outcome: Progressing   Problem: Role Relationship: Goal: Will demonstrate positive changes in social behaviors and relationships Outcome: Progressing   Problem: Safety: Goal: Ability to disclose and discuss suicidal ideas will improve Outcome: Progressing Goal: Ability to identify and utilize support systems that promote safety will improve Outcome: Progressing   Problem: Self-Concept: Goal: Will verbalize positive feelings about self Outcome: Progressing Goal: Level of anxiety will decrease Outcome: Progressing

## 2018-08-23 NOTE — Progress Notes (Signed)
Recreation Therapy Notes  Date: 08/22/2018  Time: 9:30 pm   Location: Craft Room   Behavioral response: N/A   Intervention Topic: Communication  Discussion/Intervention: Patient did not attend group.   Clinical Observations/Feedback:  Patient did not attend group.   Jalaiyah Throgmorton LRT/CTRS        Rajon Bisig 08/23/2018 11:48 AM

## 2018-08-23 NOTE — Progress Notes (Signed)
   08/23/18 1030  Clinical Encounter Type  Visited With Patient;Health care provider  Visit Type Initial;Spiritual support;Behavioral Health  Referral From Social work  Consult/Referral To Chaplain  Spiritual Encounters  Spiritual Needs Emotional;Other (Comment)   CH attended the patient's treatment team meeting. The patient was hospitalized when he made statements about killing himself. How now knows that this type of behavior will land him back in the hospital. He set a goal of getting better.

## 2018-08-23 NOTE — BHH Suicide Risk Assessment (Signed)
[facility] Discharge Suicide Risk Assessment   Principal Problem: Major depressive disorder, single episode, severe without psychotic features [facility]) Discharge Diagnoses:  Patient Active Problem List   Diagnosis Date Noted  . Major depressive disorder, single episode, severe without psychotic features (HCC) [F32.2] 08/22/2018    Priority: High  . Suicidal ideation [R45.851] 08/22/2018  . Diabetes mellitus without complication (HCC) [E11.9] 08/22/2018  . Cannabis use disorder, moderate, dependence (HCC) [F12.20] 08/22/2018  . Diabetes mellitus, new onset (HCC) [E11.9]   . DKA (diabetic ketoacidoses) (HCC) [E11.10] 02/11/2018  . Facial cellulitis [L03.211] 04/07/2017  . Periapical abscess [K04.7] 04/07/2017    Total Time spent with patient: 20 minutes  Musculoskeletal: Strength & Muscle Tone: within normal limits Gait & Station: normal Patient leans: N/A  Psychiatric Specialty Exam: Review of Systems  Neurological: Negative.   Psychiatric/Behavioral: Negative.   All other systems reviewed and are negative.   Blood pressure 116/73, pulse 84, temperature 98.7 F (37.1 C), temperature source Oral, resp. rate 18, height 5\' 6"  (1.676 m), weight 58.1 kg, SpO2 99 %.Body mass index is 20.66 kg/m.  General Appearance: Casual  Eye Contact::  Good  Speech:  Clear and Coherent409  Volume:  Normal  Mood:  Euthymic  Affect:  Appropriate  Thought Process:  Goal Directed and Descriptions of Associations: Intact  Orientation:  Full (Time, Place, and Person)  Thought Content:  WDL  Suicidal Thoughts:  No  Homicidal Thoughts:  No  Memory:  Immediate;   Fair Recent;   Fair Remote;   Fair  Judgement:  Impaired  Insight:  Shallow  Psychomotor Activity:  Normal  Concentration:  Fair  Recall:  [employer] of Knowledge:Fair  Language: Fair  Akathisia:  No  Handed:  Left  AIMS (if indicated):     Assets:  Communication Skills Desire for Improvement Housing Intimacy Resilience Social  Support Transportation  Sleep:  Number of Hours: 6.5  Cognition: WNL  ADL's:  Intact   Mental Status Per Nursing Assessment::   On Admission:  NA  Demographic Factors:  Male, Caucasian and Unemployed  Loss Factors: Decrease in vocational status, Decline in physical health and Financial problems/change in socioeconomic status  Historical Factors: Impulsivity  Risk Reduction Factors:   Sense of responsibility to family, Living with another person, especially a relative and Positive social support  Continued Clinical Symptoms:  Depression:   Impulsivity Insomnia Obsessive-Compulsive Disorder Medical Diagnoses and Treatments/Surgeries  Cognitive Features That Contribute To Risk:  None    Suicide Risk:  Minimal: No identifiable suicidal ideation.  Patients presenting with no risk factors but with morbid ruminations; may be classified as minimal risk based on the severity of the depressive symptoms  Follow-up Information    [facility] Follow up.   Why:  TBD Contact information: [address] [place] [address] [phone]           Plan Of Care/Follow-up recommendations:  Activity:  as tolerated Diet:  low sodium heart healthy ADA diet Other:  keep follow up appointments  Kristine Linea, MD 08/23/2018, 11:18 AM

## 2018-08-23 NOTE — BHH Suicide Risk Assessment (Signed)
[facility] INPATIENT:  Family/Significant Other Suicide Prevention Education  Suicide Prevention Education:  Education Completed; Kevin Russell (wife at [phone]) has been identified by the patient as the family member/significant other with whom the patient will be residing, and identified as the person(s) who will aid the patient in the event of a mental health crisis (suicidal ideations/suicide attempt).  With written consent from the patient, the significant other has been provided the following suicide prevention education, prior to the and/or following the discharge of the patient.  The suicide prevention education provided includes the following:  Suicide risk factors  Suicide prevention and interventions  National Suicide Hotline telephone number  [facility] assessment telephone number  [facility] Emergency Assistance 911  [facility] and/or Residential Mobile Crisis Unit telephone number  Request made of significant other to:  Remove weapons (e.g., guns, rifles, knives), all items previously/currently identified as safety concern.    Remove drugs/medications (over-the-counter, prescriptions, illicit drugs), all items previously/currently identified as a safety concern.  The significant other verbalizes understanding of the suicide prevention education information provided.   Alease Frame, LCSW 08/23/2018, 11:46 AM

## 2018-08-23 NOTE — Progress Notes (Signed)
  [facility] Adult Case Management Discharge Plan :  Will you be returning to the same living situation after discharge:  Yes,  Pt will be returning to his home at discharge. At discharge, do you have transportation home?: Yes,  Pt's wife will be picking him up at discharge. Do you have the ability to pay for your medications: No.  Release of information consent forms completed and in the chart;  Patient's signature needed at discharge.  Patient to Follow up at: Follow-up Information    [facility] Follow up.   Why:  TBD Contact information: [address] [place] [address] [phone]           Next level of care provider has access to [facility] Link:no  Safety Planning and Suicide Prevention discussed: Yes,  No safety concerns identified  Have you used any form of tobacco in the last 30 days? (Cigarettes, Smokeless Tobacco, Cigars, and/or Pipes): No  Has patient been referred to the Quitline?: N/A patient is not a smoker  Patient has been referred for addiction treatment: N/A  Alease Frame, LCSW 08/23/2018, 11:49 AM

## 2018-08-23 NOTE — Progress Notes (Addendum)
Inpatient Diabetes Program Recommendations  AACE/ADA: New Consensus Statement on Inpatient Glycemic Control (2015)  Target Ranges:  Prepandial:   less than 140 mg/dL      Peak postprandial:   less than 180 mg/dL (1-2 hours)      Critically ill patients:  140 - 180 mg/dL   Results for PARNELL, SPIELER (MRN [id number]) as of 08/23/2018 07:13  Ref. Range 08/22/2018 15:09 08/22/2018 21:59 08/23/2018 06:49  Glucose-Capillary Latest Ref Range: 70 - 99 mg/dL 409 (H)  5 units NOVOLOG +  17 units 70/30 Insulin 246 (H)  2 units NOVOLOG  393 (H)  9 units NOVOLOG     Admit with: IVC/ Suicidal Ideation  History: Type 1 Diabetes   Home DM Meds: 70/30 Insulin- 17 units BID  Current Orders: 70/30 Insulin- 17 units BID                            Novolog Sensitive Correction Scale/ SSI (0-9 units) TID AC + HS     MD- Please consider the following in-hospital insulin adjustments:  1. Increase 70/30 Insulin to 20 units BID with meals  2. Increase Novolog SSI to Moderate scale (0-15 units) TID AC + HS  Pt has Type 1 Diabetes and does not make endogenous insulin.  Can quickly develop DKA when insulins are held.    Note Novolog SSI and 70/30 Insulin both started yesterday at 5pm.  Will follow Daily and make recommendations as needed during hospitalization.    --Will follow patient during hospitalization--  Ambrose Finland RN, MSN, CDE Diabetes Coordinator Inpatient Glycemic Control Team Team Pager: [phone] (8a-5p)

## 2018-08-23 NOTE — Progress Notes (Signed)
Patient ID: Kevin Russell, male   DOB: Dec 20, 1985, 32 y.o.   MRN: [id number]  Discharge Note:  Patient denies SI/HI/AVH at this time. Discharge instructions, AVS, prescriptions, and transition record gone over with patient. Patient agrees to comply with medication management, follow-up visit, and outpatient therapy. Patient belongings returned to patient. Patient questions and concerns addressed and answered. Patient ambulatory off unit. Patient discharged to home with wife.

## 2018-08-23 NOTE — Discharge Summary (Addendum)
Physician Discharge Summary Note  Patient:  Kevin Russell is an 32 y.o., male MRN:  [id number] DOB:  [birth year] Patient phone:  [phone] (home)  Patient address:   [address] [place] [place] [address],  Total Time spent with patient: 20 minutes and 15 min on care coordination and documentation  Date of Admission:  08/22/2018 Date of Discharge: 08/23/2018  Reason for Admission:  Suicidal threats.  History of Present Illness:   Identifying data. [name] is a 32 year old male with a history of untreated depression and anxiety.  Chief complaint. "I was in the bathroom."  History of present illness. Information was obtained from the patient and thechart. He was brought to [facility] ER by the police. His wife took IVC after the patient became increasingly depressed, suspicious and strange, hiding her car, and threatening to "blow his brains out". He does not have access to guns. He reports that he has been depressed most of his life but it has gotten worse over time. He has never been treated. He was diagnosed with type DM in April 2019 and lost his job at [employer] while adjusting to treatment. He has been depressed, insomniac, with poor appetite, losing wight, feeling guilty hopeless worthless. His energy and concentration have been low. He has been isolating himself, staying in his room day and night with the lights off. He has been short and argumentative with his wife. He has been suspicious that she might leave him and ruminating over why she would marry him at all in the first place. Very aware of his looks. Bolding is embarrassing. He has bad teath and thinks about it all the time. He denies psychotic synmptoms. No panic attacks or social anxiety. No PTSD symptoms but severe OCD with constant worries and ruminations, worse case scenarios. Organizes things all the time. Denies drugs or alcohol.  Past psychiatric history. No treatments or suicide attempts.  Family  psychiatric history. None reported.  Social history. Has 3 children with 2 different women. He has been married to his wife for 3 years. Lost a job at [employer] due to their "point system" where no doctor's note counts when missed work for diabetes treatment.   Principal Problem: Major depressive disorder, single episode, severe without psychotic features [facility]) Discharge Diagnoses: Patient Active Problem List   Diagnosis Date Noted  . Major depressive disorder, single episode, severe without psychotic features ([place]) [F32.2] 08/22/2018    Priority: High  . Suicidal ideation [R45.851] 08/22/2018  . Diabetes mellitus without complication ([place]) [B28.4] 08/22/2018  . Cannabis use disorder, moderate, dependence ([place]) [F12.20] 08/22/2018  . Diabetes mellitus, new onset ([place]) [E11.9]   . DKA (diabetic ketoacidoses) ([place]) [E11.10] 02/11/2018  . Facial cellulitis [X32.440] 04/07/2017  . Periapical abscess [K04.7] 04/07/2017    Past Medical History:  Past Medical History:  Diagnosis Date  . Diabetes mellitus without complication ([place])    type 1   History reviewed. No pertinent surgical history. Family History: History reviewed. No pertinent family history.  Social History:  Social History   Substance and Sexual Activity  Alcohol Use No     Social History   Substance and Sexual Activity  Drug Use No    Social History   Socioeconomic History  . Marital status: Single    Spouse name: Not on file  . Number of children: Not on file  . Years of education: Not on file  . Highest education level: Not on file  Occupational History  . Not on  file  Social Needs  . Financial resource strain: Not on file  . Food insecurity:    Worry: Not on file    Inability: Not on file  . Transportation needs:    Medical: Not on file    Non-medical: Not on file  Tobacco Use  . Smoking status: Never Smoker  . Smokeless tobacco: Never Used  Substance and Sexual Activity  . Alcohol use: No  . Drug  use: No  . Sexual activity: Not on file  Lifestyle  . Physical activity:    Days per week: Not on file    Minutes per session: Not on file  . Stress: Not on file  Relationships  . Social connections:    Talks on phone: Not on file    Gets together: Not on file    Attends religious service: Not on file    Active member of club or organization: Not on file    Attends meetings of clubs or organizations: Not on file    Relationship status: Not on file  Other Topics Concern  . Not on file  Social History Narrative  . Not on file    Hospital Course:   [name] is a 32 year old male with a history of untreated depression and anxiety admitted for voicing suicidal threats with a plan. He accepted medications and tolerated them well. At the time of discharge, the patient is no longer suicidal or homicidal. He is able to contract for safety. He is forward thinking and optimistic about the future.   #Mood -continue Luvox 100 mg nightly for OCD and depression -continue Abilify 10 mg daily for augmentation/mood stabilization -Trazodone 100 mg nightly -the patient may substitute Abilify with Risperdal if unaffordable  #DM -Novolog 70/30 20 units BID -SSI, ADA diet -reports taking Novolog 70/30 15 units TID -input from diabetes nurse coordinator is greatly appreciated  #Labs -lipid panel shows elevated Chol and TG, TSH normal, A1C pending -EKG  #Disposition -discharge to home with the wife -follow up with [facility]  Physical Findings: AIMS:  , ,  ,  ,    CIWA:    COWS:     Musculoskeletal: Strength & Muscle Tone: within normal limits Gait & Station: normal Patient leans: N/A  Psychiatric Specialty Exam: Physical Exam  Nursing note and vitals reviewed. Psychiatric: He has a normal mood and affect. His speech is normal and behavior is normal. Thought content normal. Cognition and memory are normal. He expresses impulsivity.    Review of Systems  Neurological: Negative.    Psychiatric/Behavioral: Negative.   All other systems reviewed and are negative.   Blood pressure 116/73, pulse 84, temperature 98.7 F (37.1 C), temperature source Oral, resp. rate 18, height _0  (1.676 m), weight 58.1 kg, SpO2 99 %.Body mass index is 20.66 kg/m.  General Appearance: Casual  Eye Contact:  Good  Speech:  Clear and Coherent  Volume:  Normal  Mood:  Euthymic  Affect:  Appropriate  Thought Process:  Goal Directed and Descriptions of Associations: Intact  Orientation:  Full (Time, Place, and Person)  Thought Content:  WDL  Suicidal Thoughts:  No  Homicidal Thoughts:  No  Memory:  Immediate;   Fair Recent;   Fair Remote;   Fair  Judgement:  Impaired  Insight:  Shallow  Psychomotor Activity:  Normal  Concentration:  Concentration: Fair and Attention Span: Fair  Recall:  [employer] of Knowledge:  Fair  Language:  Fair  Akathisia:  No  Handed:  Left  AIMS (if indicated):     Assets:  Communication Skills Desire for Improvement Housing Intimacy Resilience Social Support Transportation  ADL's:  Intact  Cognition:  WNL  Sleep:  Number of Hours: 6.5     Have you used any form of tobacco in the last 30 days? (Cigarettes, Smokeless Tobacco, Cigars, and/or Pipes): No  Has this patient used any form of tobacco in the last 30 days? (Cigarettes, Smokeless Tobacco, Cigars, and/or Pipes) Yes, No  Blood Alcohol level:  Lab Results  Component Value Date   ETH <10 79/15/0413    Metabolic Disorder Labs:  Lab Results  Component Value Date   HGBA1C 10.4 (H) 02/11/2018   MPG 251.78 02/11/2018   No results found for: PROLACTIN Lab Results  Component Value Date   CHOL 220 (H) 08/23/2018   TRIG 135 08/23/2018   HDL 52 08/23/2018   CHOLHDL 4.2 08/23/2018   VLDL 27 08/23/2018   LDLCALC 141 (H) 08/23/2018    See Psychiatric Specialty Exam and Suicide Risk Assessment completed by Attending Physician prior to discharge.  Discharge destination:  Home  Is  patient on multiple antipsychotic therapies at discharge:  No   Has Patient had three or more failed trials of antipsychotic monotherapy by history:  No  Recommended Plan for Multiple Antipsychotic Therapies: NA  Discharge Instructions    Diet - low sodium heart healthy   Complete by:  As directed    Increase activity slowly   Complete by:  As directed      Allergies as of 08/23/2018   No Known Allergies     Medication List    TAKE these medications     Indication  ARIPiprazole 10 MG tablet Commonly known as:  ABILIFY Take 1 tablet (10 mg total) by mouth daily.  Indication:  Major Depressive Disorder   blood glucose meter kit and supplies Kit Dispense based on patient and insurance preference. Use up to three times daily as directed. (FOR ICD-9 250.00, 250.01).  Indication:  diabetes   fluvoxaMINE 100 MG tablet Commonly known as:  LUVOX Take 1 tablet (100 mg total) by mouth at bedtime.  Indication:  Major Depressive Disorder, Obsessive Compulsive Disorder   insulin aspart protamine- aspart (70-30) 100 UNIT/ML injection Commonly known as:  NOVOLOG MIX 70/30 Inject 0.2 mLs (20 Units total) into the skin 2 (two) times daily with a meal. What changed:  how much to take  Indication:  Insulin-Dependent Diabetes   Insulin Syringes (Disposable) U-100 0.5 ML Misc Use to inject insulin twice a day as instructed  Indication:  diabetes   traZODone 100 MG tablet Commonly known as:  DESYREL Take 1 tablet (100 mg total) by mouth at bedtime as needed for sleep.  Indication:  Trouble Sleeping      Follow-up Information    [facility] Follow up on 08/29/2018.   Why:  Your follow-up appointment is scheduled for the above date at 10:00AM.  Please bring photo ID and proof of income.  They will be able to assist you with obtaining your medications. Contact information: [address] [place] [address] [phone]           Follow-up recommendations:  Activity:   as tolerated Diet:  low sodium heart healthy ADA diet Other:  keep follow up appointments  Comments:    Signed: Orson Slick, MD 08/23/2018, 12:20 PM

## 2018-10-09 IMAGING — CT CT MAXILLOFACIAL W/ CM
3 series · 15 of 47 positions shown, 18 images · IV contrast (Omni 300)
Comparison: None.

CLINICAL DATA: RIGHT dental abscess and facial swelling, began
antibiotic yesterday. Febrile today with worsening swelling.

EXAM:
CT MAXILLOFACIAL WITH CONTRAST
TECHNIQUE: Multidetector CT imaging of the maxillofacial structures was
performed. Multiplanar CT image reconstructions were also generated.
A small metallic BB was placed on the right temple in order to
reliably differentiate right from left.

[Series 3: facialbone 2.0 st · axial · 0.35mm/px · z∈[-202,-26]mm · 9 of 102 slices shown, 12 images]
[im 7/102  brain]
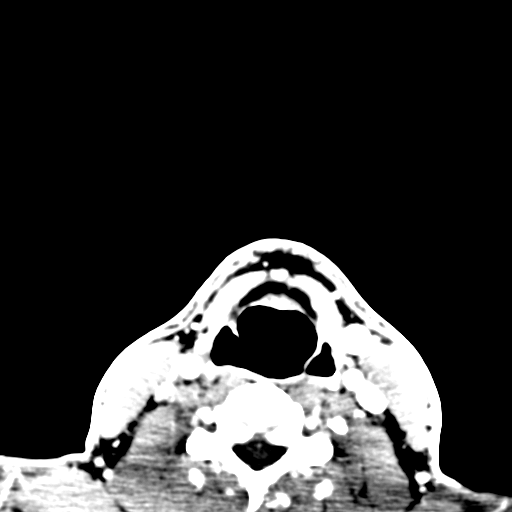
[im 7/102  bone]
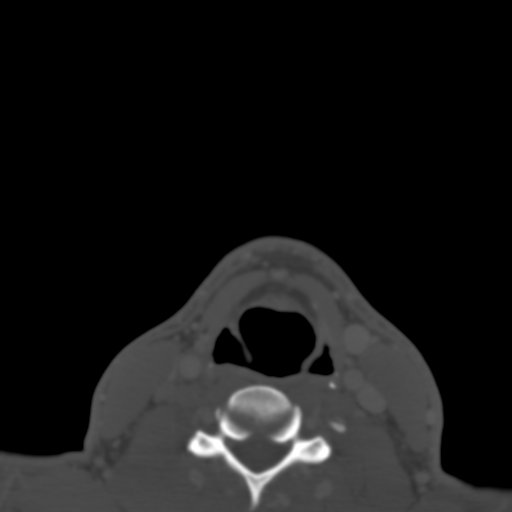
[im 18/102  bone]
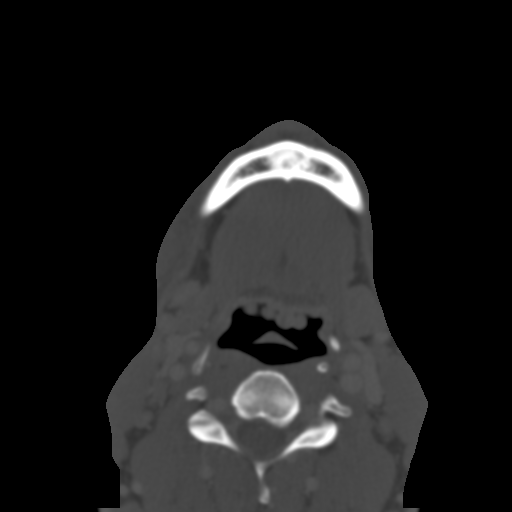
[im 28/102  bone]
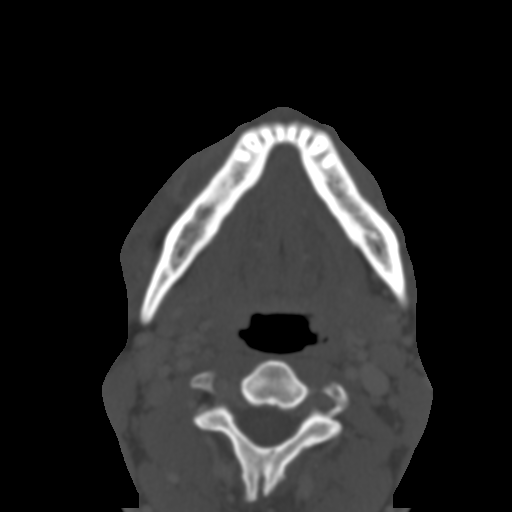
[im 39/102  bone]
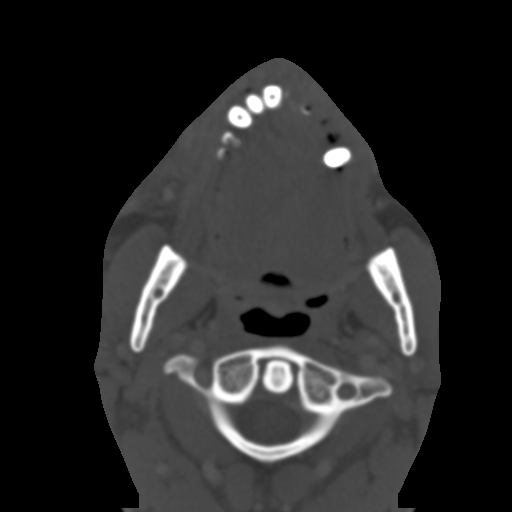
[im 53/102  brain]
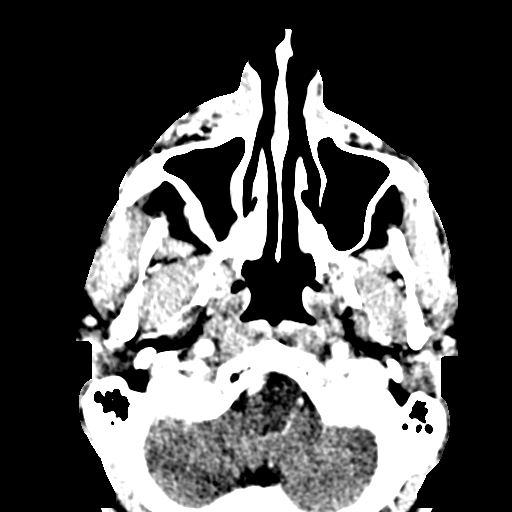
[im 53/102  bone]
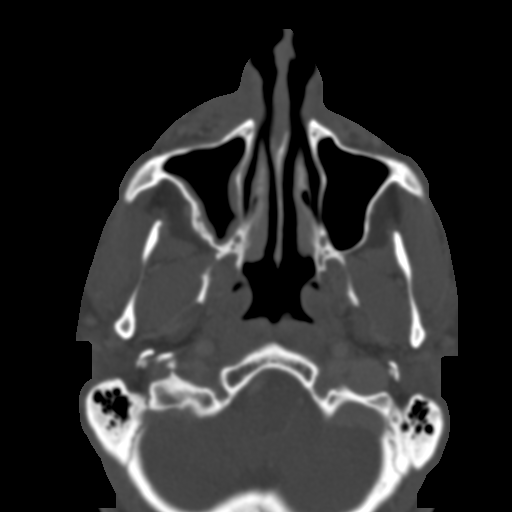
[im 63/102  bone]
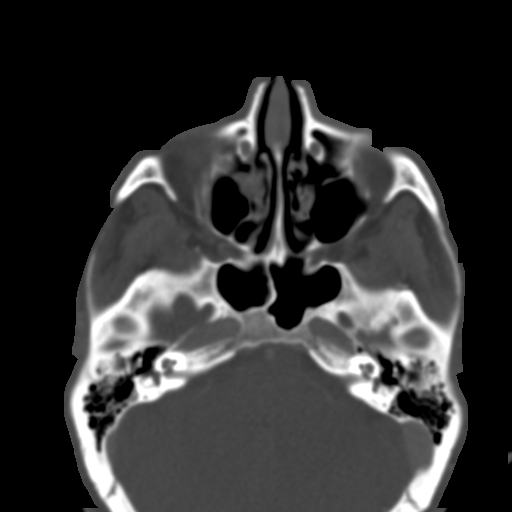
[im 74/102  bone]
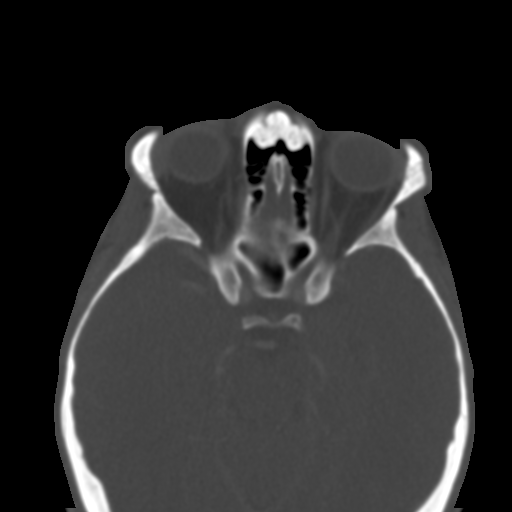
[im 84/102  bone]
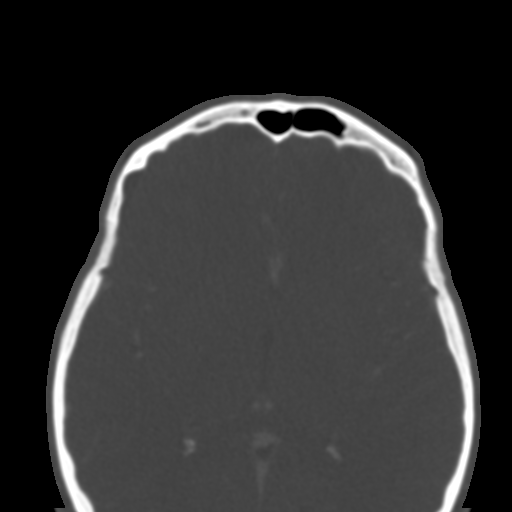
[im 95/102  brain]
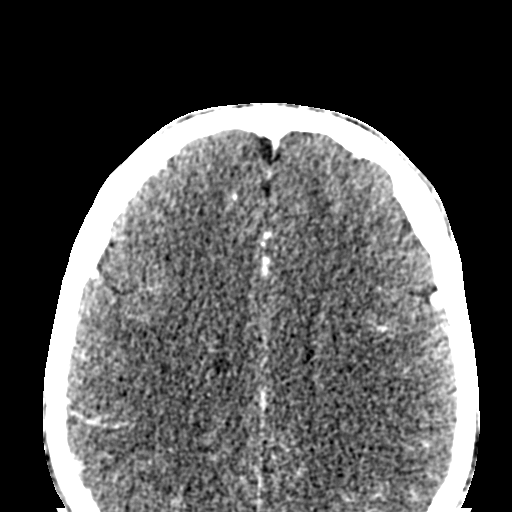
[im 95/102  bone]
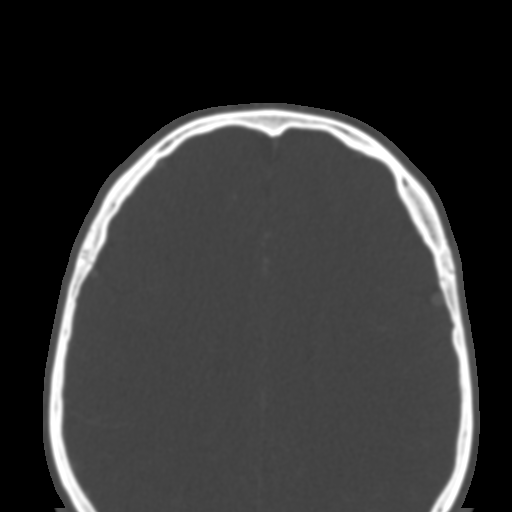

[Series 7: facialbone 2.0 cor st · coronal · 0.41mm/px · 3 of 80 slices shown]
[im 27/80  bone]
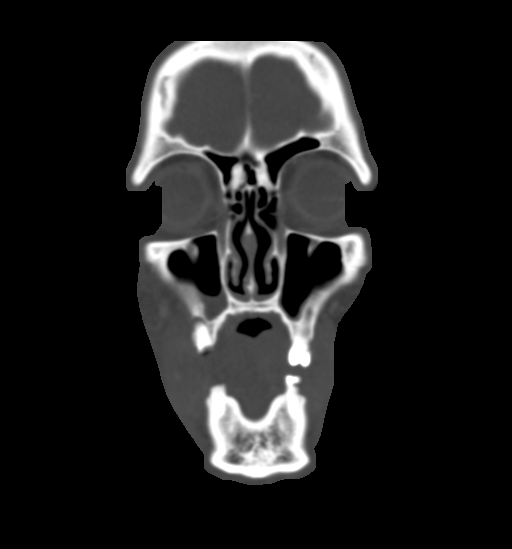
[im 36/80  bone]
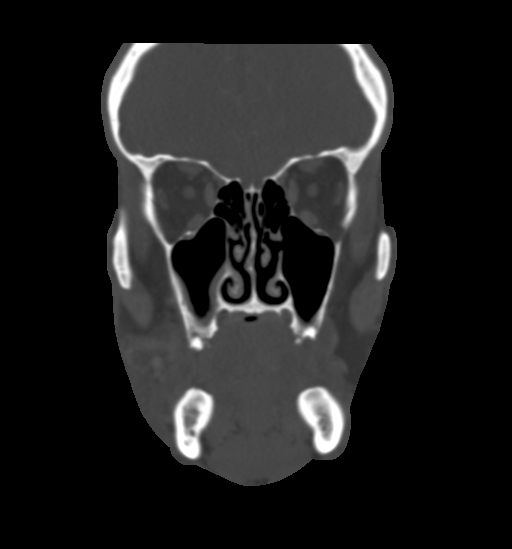
[im 44/80  bone]
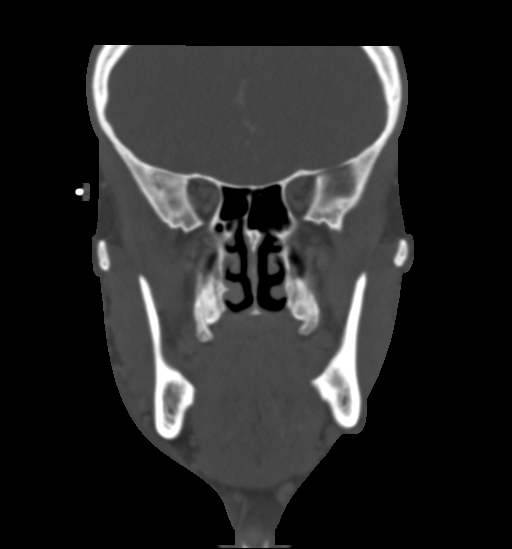

[Series 8: facialbone 2.0 sag st · sagittal · 0.35mm/px · 3 of 87 slices shown]
[im 29/87  bone]
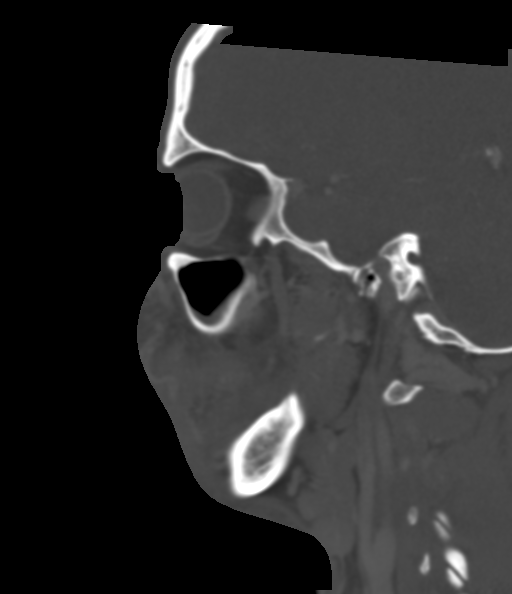
[im 44/87  bone]
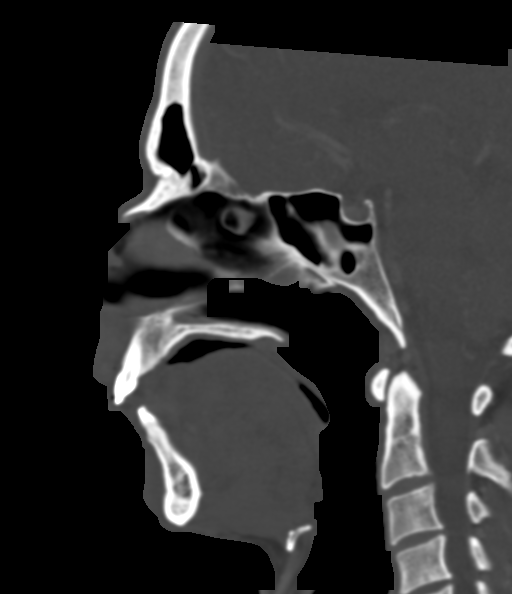
[im 58/87  bone]
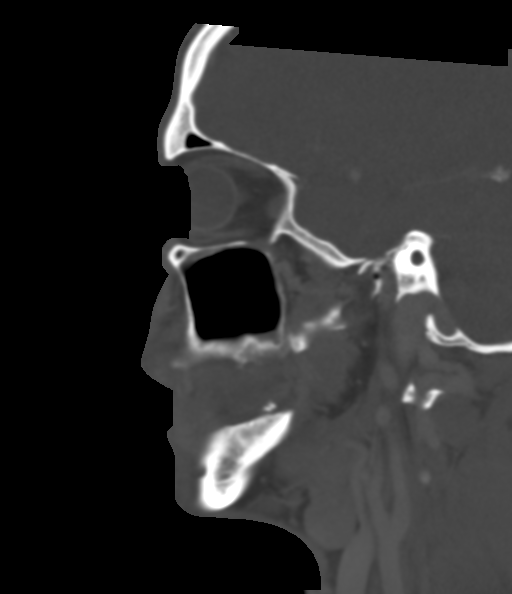

[15 of 47 positions shown; findings below may reference images not displayed]

FINDINGS: OSSEOUS: Multiple dental caries and periapical lucency/abscess with
periosteal re- absorption. Large tooth 7 periapical abscess
associated with soft tissue abscess is described below. No acute
facial fracture. RIGHT maxillary oral antral fistula.

ORBITS: Ocular globes and orbital contents are normal.

SINUSES: Mild paranasal sinus mucosal thickening and scattered
subcentimeter mucosal retention cyst. Mild RIGHT maxillary sinus
bony remodeling. Mastoid air cells are well aerated. Nasal septum is
midline.

SOFT TISSUES: 7 x 19 x 25 mm (AP by transverse by CC) RIGHT pre
maxillary subperiosteal abscess was overlying soft tissue swelling,
subcutaneous fat stranding consistent with cellulitis extending to
RIGHT orbital soft tissues. No subcutaneous gas or radiopaque
foreign bodies.

LIMITED INTRACRANIAL: Normal.
IMPRESSION: Tooth 7 periapical abscess, with overlying 7 x 19 x 25 mm abscess
within the pre maxillary soft tissues. Generalized RIGHT facial
cellulitis without postseptal extent.

Poor dentition.

## 2019-05-13 ENCOUNTER — Encounter (HOSPITAL_COMMUNITY): Payer: Self-pay | Admitting: *Deleted

## 2019-05-13 ENCOUNTER — Encounter (HOSPITAL_COMMUNITY): Payer: Self-pay | Admitting: Emergency Medicine

## 2019-05-13 ENCOUNTER — Emergency Department (HOSPITAL_COMMUNITY)
Admission: EM | Admit: 2019-05-13 | Discharge: 2019-05-13 | Disposition: A | Discharge status: Home / Self Care | Payer: Self-pay | Attending: Emergency Medicine | Admitting: Emergency Medicine

## 2019-05-13 ENCOUNTER — Inpatient Hospital Stay (HOSPITAL_COMMUNITY)
Admission: AD | Admit: 2019-05-13 | Discharge: 2019-05-17 | DRG: 885 | Disposition: A | Discharge status: Home / Self Care | Payer: Federal, State, Local not specified - Other | Attending: Psychiatry | Admitting: Psychiatry

## 2019-05-13 ENCOUNTER — Other Ambulatory Visit: Payer: Self-pay

## 2019-05-13 DIAGNOSIS — Z20828 Contact with and (suspected) exposure to other viral communicable diseases: Secondary | ICD-10-CM | POA: Insufficient documentation

## 2019-05-13 DIAGNOSIS — F17200 Nicotine dependence, unspecified, uncomplicated: Secondary | ICD-10-CM | POA: Diagnosis present

## 2019-05-13 DIAGNOSIS — F332 Major depressive disorder, recurrent severe without psychotic features: Secondary | ICD-10-CM | POA: Diagnosis present

## 2019-05-13 DIAGNOSIS — F419 Anxiety disorder, unspecified: Secondary | ICD-10-CM | POA: Diagnosis present

## 2019-05-13 DIAGNOSIS — E1065 Type 1 diabetes mellitus with hyperglycemia: Secondary | ICD-10-CM | POA: Insufficient documentation

## 2019-05-13 DIAGNOSIS — R45851 Suicidal ideations: Secondary | ICD-10-CM | POA: Insufficient documentation

## 2019-05-13 DIAGNOSIS — G4700 Insomnia, unspecified: Secondary | ICD-10-CM | POA: Diagnosis present

## 2019-05-13 DIAGNOSIS — F429 Obsessive-compulsive disorder, unspecified: Secondary | ICD-10-CM | POA: Diagnosis present

## 2019-05-13 DIAGNOSIS — Z794 Long term (current) use of insulin: Secondary | ICD-10-CM | POA: Diagnosis not present

## 2019-05-13 DIAGNOSIS — Z560 Unemployment, unspecified: Secondary | ICD-10-CM | POA: Diagnosis not present

## 2019-05-13 DIAGNOSIS — E109 Type 1 diabetes mellitus without complications: Secondary | ICD-10-CM | POA: Diagnosis present

## 2019-05-13 DIAGNOSIS — Z79899 Other long term (current) drug therapy: Secondary | ICD-10-CM | POA: Insufficient documentation

## 2019-05-13 HISTORY — DX: Suicidal ideations: R45.851

## 2019-05-13 LAB — RAPID URINE DRUG SCREEN, HOSP PERFORMED
Amphetamines: NOT DETECTED
Barbiturates: NOT DETECTED
Benzodiazepines: NOT DETECTED
Cocaine: NOT DETECTED
Opiates: NOT DETECTED
Tetrahydrocannabinol: POSITIVE — AB

## 2019-05-13 LAB — COMPREHENSIVE METABOLIC PANEL
ALT: 16 U/L (ref 0–44)
AST: 20 U/L (ref 15–41)
Albumin: 4.6 g/dL (ref 3.5–5.0)
Alkaline Phosphatase: 63 U/L (ref 38–126)
Anion gap: 10 (ref 5–15)
BUN: 15 mg/dL (ref 6–20)
CO2: 27 mmol/L (ref 22–32)
Calcium: 9 mg/dL (ref 8.9–10.3)
Chloride: 99 mmol/L (ref 98–111)
Creatinine, Ser: 0.97 mg/dL (ref 0.61–1.24)
GFR calc Af Amer: >60 mL/min (ref >60)
GFR calc non Af Amer: >60 mL/min (ref >60)
Glucose, Bld: 211 mg/dL — ABNORMAL HIGH (ref 70–99)
Potassium: 4 mmol/L (ref 3.5–5.1)
Sodium: 136 mmol/L (ref 135–145)
Total Bilirubin: 0.9 mg/dL (ref 0.3–1.2)
Total Protein: 7.6 g/dL (ref 6.5–8.1)

## 2019-05-13 LAB — HEMOGLOBIN A1C
Hgb A1c MFr Bld: 9.8 % — ABNORMAL HIGH (ref 4.8–5.6)
Hgb A1c MFr Bld: 9.6 % — ABNORMAL HIGH (ref 4.8–5.6)
Mean Plasma Glucose: 234.56 mg/dL
Mean Plasma Glucose: 228.82 mg/dL

## 2019-05-13 LAB — CBC
HCT: 45.7 % (ref 39.0–52.0)
Hemoglobin: 15.3 g/dL (ref 13.0–17.0)
MCH: 30.2 pg (ref 26.0–34.0)
MCHC: 33.5 g/dL (ref 30.0–36.0)
MCV: 90.1 fL (ref 80.0–100.0)
Platelets: 261 10*3/uL (ref 150–400)
RBC: 5.07 MIL/uL (ref 4.22–5.81)
RDW: 13.2 % (ref 11.5–15.5)
WBC: 11.3 10*3/uL — ABNORMAL HIGH (ref 4.0–10.5)
nRBC: 0 % (ref 0.0–0.2)

## 2019-05-13 LAB — CBG MONITORING, ED: Glucose-Capillary: 112 mg/dL — ABNORMAL HIGH (ref 70–99)

## 2019-05-13 LAB — ETHANOL: Alcohol, Ethyl (B): <10 mg/dL (ref <10)

## 2019-05-13 LAB — SARS CORONAVIRUS 2 BY RT PCR (HOSPITAL ORDER, PERFORMED IN ~~LOC~~ HOSPITAL LAB): SARS Coronavirus 2: NEGATIVE

## 2019-05-13 LAB — ACETAMINOPHEN LEVEL: Acetaminophen (Tylenol), Serum: <10 ug/mL — ABNORMAL LOW (ref 10–30)

## 2019-05-13 LAB — GLUCOSE, CAPILLARY
Glucose-Capillary: 176 mg/dL — ABNORMAL HIGH (ref 70–99)
Glucose-Capillary: 102 mg/dL — ABNORMAL HIGH (ref 70–99)

## 2019-05-13 LAB — SALICYLATE LEVEL: Salicylate Lvl: <7 mg/dL (ref 2.8–30.0)

## 2019-05-13 MED ORDER — TRAZODONE HCL 50 MG PO TABS: 100.0000 mg | ORAL_TABLET | Freq: Every evening | ORAL | Status: DC | PRN

## 2019-05-13 MED ORDER — MAGNESIUM HYDROXIDE 400 MG/5ML PO SUSP: 30.0000 mL | Freq: Every day | ORAL | Status: DC | PRN

## 2019-05-13 MED ORDER — TRAZODONE HCL 100 MG PO TABS
100.0000 mg | ORAL_TABLET | Freq: Every evening | ORAL | Status: DC | PRN
  Administered 2019-05-13 – 2019-05-15 (×3): 100 mg via ORAL
  Filled 2019-05-13 (×2): qty 1

## 2019-05-13 MED ORDER — INSULIN ASPART 100 UNIT/ML ~~LOC~~ SOLN: 0.0000 [IU] | Freq: Three times a day (TID) | SUBCUTANEOUS | Status: DC

## 2019-05-13 MED ORDER — HYDROXYZINE HCL 25 MG PO TABS
25.0000 mg | ORAL_TABLET | Freq: Three times a day (TID) | ORAL | Status: DC | PRN
  Administered 2019-05-13 – 2019-05-15 (×4): 25 mg via ORAL
  Filled 2019-05-13 (×4): qty 1

## 2019-05-13 MED ORDER — ALUM & MAG HYDROXIDE-SIMETH 200-200-20 MG/5ML PO SUSP: 30.0000 mL | ORAL | Status: DC | PRN

## 2019-05-13 MED ORDER — INSULIN ASPART PROT & ASPART (70-30 MIX) 100 UNIT/ML ~~LOC~~ SUSP
20.0000 [IU] | Freq: Two times a day (BID) | SUBCUTANEOUS | Status: DC
  Administered 2019-05-13 – 2019-05-17 (×7): 20 [IU] via SUBCUTANEOUS

## 2019-05-13 MED ORDER — ARIPIPRAZOLE 10 MG PO TABS
10.0000 mg | ORAL_TABLET | Freq: Every day | ORAL | Status: DC
  Administered 2019-05-14 – 2019-05-17 (×4): 10 mg via ORAL
  Filled 2019-05-13 (×6): qty 1

## 2019-05-13 MED ORDER — FLUVOXAMINE MALEATE 100 MG PO TABS
100.0000 mg | ORAL_TABLET | Freq: Every day | ORAL | Status: DC
  Administered 2019-05-13 – 2019-05-15 (×3): 100 mg via ORAL
  Filled 2019-05-13 (×3): qty 1
  Filled 2019-05-13: qty 2
  Filled 2019-05-13 (×2): qty 1

## 2019-05-13 MED ORDER — ACETAMINOPHEN 325 MG PO TABS: 650.0000 mg | ORAL_TABLET | Freq: Four times a day (QID) | ORAL | Status: DC | PRN

## 2019-05-13 MED ORDER — ARIPIPRAZOLE 5 MG PO TABS
10.0000 mg | ORAL_TABLET | Freq: Every day | ORAL | Status: DC
  Filled 2019-05-13: qty 2

## 2019-05-13 MED ORDER — INSULIN ASPART 100 UNIT/ML ~~LOC~~ SOLN
0.0000 [IU] | Freq: Three times a day (TID) | SUBCUTANEOUS | Status: DC
  Administered 2019-05-13: 2 [IU] via SUBCUTANEOUS
  Administered 2019-05-14: 1 [IU] via SUBCUTANEOUS
  Administered 2019-05-14: 2 [IU] via SUBCUTANEOUS
  Administered 2019-05-15: 3 [IU] via SUBCUTANEOUS
  Administered 2019-05-16 – 2019-05-17 (×3): 1 [IU] via SUBCUTANEOUS

## 2019-05-13 MED ORDER — FLUVOXAMINE MALEATE 50 MG PO TABS
100.0000 mg | ORAL_TABLET | Freq: Every day | ORAL | Status: DC
  Filled 2019-05-13: qty 1

## 2019-05-13 MED ORDER — INSULIN ASPART PROT & ASPART (70-30 MIX) 100 UNIT/ML ~~LOC~~ SUSP
20.0000 [IU] | Freq: Two times a day (BID) | SUBCUTANEOUS | Status: DC
  Filled 2019-05-13: qty 10

## 2019-05-13 NOTE — Progress Notes (Signed)
Lindberg is a 33 year old male pt admitted on involuntary basis. On admission, he currently denies SI and spoke about having a fight with his wife and is able to contract for safety while in the hospital. He reports that he was hospitalized at [facility] this past October and was prescribed medications but does not take them. He does endorse marijuana usage and occasional alcohol use. He reports that he stays with either his mother or sister and reports that he will be able to stay with one of them upon discharge. Eliyohu was cooperative during admission, he was escorted to the unit and safety maintained.

## 2019-05-13 NOTE — ED Notes (Signed)
Patient left facility with [employer] escort.  All belongings with [employer].  Attempt call reports to [facility], no answer at this time.

## 2019-05-13 NOTE — Progress Notes (Signed)
Per Joselyn Arrow, pt has been accepted to [facility] bed 404-2. Accepting provider is R. Doren Custard, NP. Attending provider is Dr. Mallie Darting, MD. Patient can arrive as soon as possible.. Number for report is [phone]. CSW spoke with Sharyn Lull, RN regarding disposition.   Chalmers Guest. Guerry Bruin, MSW, [place] Work/Disposition Phone: [phone] Fax: [phone]

## 2019-05-13 NOTE — BH Assessment (Addendum)
Tele Assessment Note   Patient Name: Kevin Russell MRN: [id number] Referring Physician: Glynn Octaveancour, Stephen, MD Location of Patient: [facility] Location of Provider: Behavioral Health TTS Department  Kevin Russell is an 33 y.o. male who presents to the ED under IVC initiated by his separated wife. Per IVC, respondent "threatened suicide today and stated to her, "If I can't have you it no need to be here." He did give himself a high dose of insulin in stomach on purpose to hurt himself, he also told petitioner, his legal wife, just promise me if anything happens that you will be there." Pt admits he did threaten suicide today by OD on insulin and states he became upset because his wife would not take him back. Pt states he and his wife got into an argument on June 21st and he moved out. Pt states he was upset that his wife did not stop him from moving out. Pt states he tried to move back in but his wife would not let him come back. Pt states he has been staying with his sister and his mother since he left his home. Pt states he has been depressed for several months but denies current MH treatment. Pt [facility] for MDD but pt reports he does not feel that he stayed long enough to get the help he needs. Pt states he has had SI in the past but denies that he has ever acted on them. Pt states he has trouble sleeping because his mind constantly races. Pt states he does not feel that his wife has been supportive and continues to endorse SI and depression.  Per Lerry Linerashaun Dixon, NP pt is recommended for inpt tx. TTS to seek placement. EDP Rancour, Jeannett SeniorStephen, MD and pt's nurse Florentina AddisonKatie, RN have been advised.  Diagnosis: MDD, recurrent, severe, w/o psychosis  Past Medical History:  Past Medical History:  Diagnosis Date  . 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 Suicidal ideation     History reviewed. No pertinent surgical history.  Family History: History reviewed. No pertinent  family history.  Social History:  reports that he has never smoked. He has never used smokeless tobacco. He reports that he does not drink alcohol or use drugs.  Additional Social History:  Alcohol / Drug Use Pain Medications: See MAR Prescriptions: See MAR Over the Counter: See MAR History of alcohol / drug use?: Yes Substance #1 Name of Substance 1: Cannabis 1 - Age of First Use: unknown 1 - Amount (size/oz): unknown 1 - Frequency: unknown 1 - Duration: unknown 1 - Last Use / Amount: unknown, pt denies use. labs positive on arrival to ED  CIWA: CIWA-Ar BP: 114/75 Pulse Rate: (!) 103 COWS:    Allergies: No Known Allergies  Home Medications: (Not in a hospital admission)   OB/GYN Status:  No LMP for male patient.  General Assessment Data Location of Assessment: [facility] TTS Assessment: In system Is this a Tele or Face-to-Face Assessment?: Tele Assessment Is this an Initial Assessment or a Re-assessment for this encounter?: Initial Assessment Patient Accompanied by:: N/A Language Other than English: No Living Arrangements: Other (Comment)(with family) What gender do you identify as?: Male Marital status: Separated Pregnancy Status: No Living Arrangements: Other relatives Can pt return to current living arrangement?: Yes Admission Status: Involuntary Petitioner: Family member Is patient capable of signing voluntary admission?: No Referral Source: Self/Family/Friend Insurance type: none     Crisis Care Plan Living Arrangements:  Other relatives Name of Psychiatrist: none Name of Therapist: none  Education Status Is patient currently in school?: No Is the patient employed, unemployed or receiving disability?: Employed  Risk to self with the past 6 months Suicidal Ideation: Yes-Currently Present Has patient been a risk to self within the past 6 months prior to admission? : Yes Suicidal Intent: Yes-Currently Present Has patient had any suicidal intent within the  past 6 months prior to admission? : Yes Is patient at risk for suicide?: Yes Suicidal Plan?: Yes-Currently Present Has patient had any suicidal plan within the past 6 months prior to admission? : Yes Specify Current Suicidal Plan: pt intentionally attempted to OD on insulin Access to Means: Yes Specify Access to Suicidal Means: pt has access to insulin What has been your use of drugs/alcohol within the last 12 months?: denies use, labs positive for cannabis Previous Attempts/Gestures: No Triggers for Past Attempts: None known Intentional Self Injurious Behavior: Damaging Comment - Self Injurious Behavior: pt intentionally ingested too high dose of insulin Family Suicide History: No Recent stressful life event(s): Conflict (Comment), Divorce(argue with wife) Persecutory voices/beliefs?: No Depression: Yes Depression Symptoms: Despondent, Insomnia, Loss of interest in usual pleasures, Feeling worthless/self pity, Feeling angry/irritable, Guilt Substance abuse history and/or treatment for substance abuse?: No Suicide prevention information given to non-admitted patients: Not applicable  Risk to Others within the past 6 months Homicidal Ideation: No Does patient have any lifetime risk of violence toward others beyond the six months prior to admission? : No Thoughts of Harm to Others: No Current Homicidal Intent: No Current Homicidal Plan: No Access to Homicidal Means: No History of harm to others?: No Assessment of Violence: None Noted Does patient have access to weapons?: No Criminal Charges Pending?: No Does patient have a court date: No Is patient on probation?: No  Psychosis Hallucinations: None noted Delusions: None noted  Mental Status Report Appearance/Hygiene: Unremarkable, In scrubs Eye Contact: Good Motor Activity: Freedom of movement Speech: Logical/coherent Level of Consciousness: Alert Mood: Depressed Affect: Depressed Anxiety Level: None Thought Processes:  Relevant, Coherent Judgement: Impaired Orientation: Person, Place, Appropriate for developmental age, Situation, Time Obsessive Compulsive Thoughts/Behaviors: None  Cognitive Functioning Concentration: Normal Memory: Remote Intact, Recent Intact Is patient IDD: No Insight: Poor Impulse Control: Poor Appetite: Fair Have you had any weight changes? : No Change Sleep: Decreased Total Hours of Sleep: 3 Vegetative Symptoms: None  ADLScreening [facility] Assessment Services) Patient's cognitive ability adequate to safely complete daily activities?: Yes Patient able to express need for assistance with ADLs?: Yes Independently performs ADLs?: Yes (appropriate for developmental age)  Prior Inpatient Therapy Prior Inpatient Therapy: Yes Prior Therapy Dates: 2019 Prior Therapy Facilty/Provider(s): [facility] Reason for Treatment: MDD  Prior Outpatient Therapy Prior Outpatient Therapy: No Does patient have an ACCT team?: No Does patient have Intensive In-House Services?  : No Does patient have [facility] services? : No Does patient have P4CC services?: No  ADL Screening (condition at time of admission) Patient's cognitive ability adequate to safely complete daily activities?: Yes Is the patient deaf or have difficulty hearing?: No Does the patient have difficulty seeing, even when wearing glasses/contacts?: No Does the patient have difficulty concentrating, remembering, or making decisions?: No Patient able to express need for assistance with ADLs?: Yes Does the patient have difficulty dressing or bathing?: No Independently performs ADLs?: Yes (appropriate for developmental age) Does the patient have difficulty walking or climbing stairs?: No Weakness of Legs: None Weakness of Arms/Hands: None  Home Assistive Devices/Equipment Home Assistive Devices/Equipment:  None    Abuse/Neglect Assessment (Assessment to be complete while patient is alone) Abuse/Neglect Assessment Can Be Completed:  Yes Physical Abuse: Denies Verbal Abuse: Denies Sexual Abuse: Denies Exploitation of patient/patient's resources: Denies Self-Neglect: Denies     Merchant navy officerAdvance Directives (For Healthcare) Does Patient Have a Medical Advance Directive?: No Would patient like information on creating a medical advance directive?: No - Patient declined          Disposition: Per Lerry Linerashaun Dixon, NP pt is recommended for inpt tx. TTS to seek placement. EDP Rancour, Jeannett SeniorStephen, MD and pt's nurse Florentina AddisonKatie, RN have been advised. Disposition Initial Assessment Completed for this Encounter: Yes Disposition of Patient: Admit Type of inpatient treatment program: Adult Patient refused recommended treatment: No  This service was provided via telemedicine using a 2-way, interactive audio and video technology.  Names of all persons participating in this telemedicine service and their role in this encounter. Name: Kevin Russell Role: Patient  Name: Princess BruinsAquicha Tamya Denardo Role: TTS          Karolee Ohsquicha R Renley Gutman 05/13/2019 4:25 AM

## 2019-05-13 NOTE — ED Provider Notes (Signed)
[facility] EMERGENCY DEPARTMENT Provider Note   CSN: [id number] Arrival date & time: 05/13/19  0137     History   Chief Complaint Chief Complaint  Patient presents with  . V70.1    HPI Kevin Russell is a 33 y.o. male.     Type I diabetic here brought by the police for suicidal ideation and depression.  Patient told police he was planning to overdose on insulin but he denies this to me.  He states he has had depression and thoughts like this for quite some time but became worse during issues with his significant other.  He denies any homicidal thoughts or hearing any voices.  He denies using any drugs or alcohol.  No abdominal pain, nausea, vomiting, fever, chills.  No pain with urination or blood in the urine.  The history is provided by the patient.    Past Medical History:  Diagnosis Date  . Diabetes mellitus without complication ([place])    type 1  . Suicidal ideation     Patient Active Problem List   Diagnosis Date Noted  . Major depressive disorder, single episode, severe without psychotic features ([place]) 08/22/2018  . Suicidal ideation 08/22/2018  . Diabetes mellitus without complication ([place]) 27/78/2423  . Cannabis use disorder, moderate, dependence ([place]) 08/22/2018  . Diabetes mellitus, new onset ([place])   . DKA (diabetic ketoacidoses) ([place]) 02/11/2018  . Facial cellulitis 04/07/2017  . Periapical abscess 04/07/2017    History reviewed. No pertinent surgical history.      Home Medications    Prior to Admission medications   Medication Sig Start Date End Date Taking? Authorizing Provider  blood glucose meter kit and supplies KIT Dispense based on patient and insurance preference. Use up to three times daily as directed. (FOR ICD-9 250.00, 250.01). 02/12/18  Yes Barton Dubois, MD  insulin aspart protamine- aspart (NOVOLOG MIX 70/30) (70-30) 100 UNIT/ML injection Inject 0.2 mLs (20 Units total) into the skin 2 (two) times daily with a meal. 08/23/18  Yes  Pucilowska, Jolanta B, MD  Insulin Syringes, Disposable, U-100 0.5 ML MISC Use to inject insulin twice a day as instructed 02/12/18  Yes Barton Dubois, MD  ARIPiprazole (ABILIFY) 10 MG tablet Take 1 tablet (10 mg total) by mouth daily. 08/23/18   Pucilowska, Jolanta B, MD  fluvoxaMINE (LUVOX) 100 MG tablet Take 1 tablet (100 mg total) by mouth at bedtime. 08/23/18   Pucilowska, Herma Ard B, MD  traZODone (DESYREL) 100 MG tablet Take 1 tablet (100 mg total) by mouth at bedtime as needed for sleep. 08/23/18   Clovis Fredrickson, MD    Family History History reviewed. No pertinent family history.  Social History Social History   Tobacco Use  . Smoking status: Never Smoker  . Smokeless tobacco: Never Used  Substance Use Topics  . Alcohol use: No  . Drug use: No     Allergies   Patient has no known allergies.   Review of Systems Review of Systems  Constitutional: Negative for activity change, appetite change and fever.  HENT: Negative for congestion and rhinorrhea.   Eyes: Negative for visual disturbance.  Respiratory: Negative for cough, chest tightness and shortness of breath.   Cardiovascular: Negative for chest pain.  Gastrointestinal: Negative for abdominal pain, nausea and vomiting.  Genitourinary: Negative for dysuria and hematuria.  Musculoskeletal: Negative for arthralgias and myalgias.  Skin: Negative for rash.  Neurological: Negative for dizziness, weakness and headaches.  Psychiatric/Behavioral: Positive for decreased concentration, self-injury and suicidal ideas. Negative  for behavioral problems. The patient is nervous/anxious. The patient is not hyperactive.     all other systems are negative except as noted in the HPI and PMH.    Physical Exam Updated Vital Signs BP 114/75   Pulse (!) 103   Temp 98 F (36.7 C) (Oral)   Resp 20   Ht '5\' 11"'  (1.803 m)   Wt 65.8 kg   SpO2 100%   BMI 20.22 kg/m   Physical Exam Vitals signs and nursing note reviewed.   Constitutional:      General: He is not in acute distress.    Appearance: He is well-developed.  HENT:     Head: Normocephalic and atraumatic.     Mouth/Throat:     Pharynx: No oropharyngeal exudate.  Eyes:     Conjunctiva/sclera: Conjunctivae normal.     Pupils: Pupils are equal, round, and reactive to light.  Neck:     Musculoskeletal: Normal range of motion and neck supple.     Comments: No meningismus. Cardiovascular:     Rate and Rhythm: Normal rate and regular rhythm.     Heart sounds: Normal heart sounds. No murmur.  Pulmonary:     Effort: Pulmonary effort is normal. No respiratory distress.     Breath sounds: Normal breath sounds.  Abdominal:     Palpations: Abdomen is soft.     Tenderness: There is no abdominal tenderness. There is no guarding or rebound.  Musculoskeletal: Normal range of motion.        General: No tenderness.  Skin:    General: Skin is warm.     Capillary Refill: Capillary refill takes less than 2 seconds.  Neurological:     General: No focal deficit present.     Mental Status: He is alert and oriented to person, place, and time. Mental status is at baseline.     Cranial Nerves: No cranial nerve deficit.     Motor: No abnormal muscle tone.     Coordination: Coordination normal.     Comments: No ataxia on finger to nose bilaterally. No pronator drift. 5/5 strength throughout. CN 2-12 intact.Equal grip strength. Sensation intact.   Psychiatric:        Behavior: Behavior normal.      ED Treatments / Results  Labs (all labs ordered are listed, but only abnormal results are displayed) Labs Reviewed  COMPREHENSIVE METABOLIC PANEL - Abnormal; Notable for the following components:      Result Value   Glucose, Bld 211 (*)    All other components within normal limits  ACETAMINOPHEN LEVEL - Abnormal; Notable for the following components:   Acetaminophen (Tylenol), Serum <10 (*)    All other components within normal limits  CBC - Abnormal; Notable  for the following components:   WBC 11.3 (*)    All other components within normal limits  RAPID URINE DRUG SCREEN, HOSP PERFORMED - Abnormal; Notable for the following components:   Tetrahydrocannabinol POSITIVE (*)    All other components within normal limits  ETHANOL  SALICYLATE LEVEL  HEMOGLOBIN A1C    EKG None  Radiology No results found.  Procedures Procedures (including critical care time)  Medications Ordered in ED Medications - No data to display   Initial Impression / Assessment and Plan / ED Course  I have reviewed the triage vital signs and the nursing notes.  Pertinent labs & imaging results that were available during my care of the patient were reviewed by me and considered in my medical  decision making (see chart for details).       Suicidal with plan to overdose.  He is calm and cooperative  Labs show hyperglycemia without DKA.  He is medically clear for psychiatric evaluation.  Patient seen by TTS.  He meets inpatient criteria.  Holding orders placed.  Final Clinical Impressions(s) / ED Diagnoses   Final diagnoses:  Suicidal ideation    ED Discharge Orders    None       Firmin Belisle, Annie Main, MD 05/13/19 [phone]

## 2019-05-13 NOTE — [facility] Group Notes (Signed)
Pt did not attend wrap up group this evening. Pt was asleep in bed.  

## 2019-05-13 NOTE — Tx Team (Signed)
Initial Treatment Plan 05/13/2019 5:26 PM JERRY CLYNE IWP:[id number]    PATIENT STRESSORS: Marital or family conflict Medication change or noncompliance   PATIENT STRENGTHS: Ability for insight Average or above average intelligence Capable of independent living General fund of knowledge Supportive family/friends   PATIENT IDENTIFIED PROBLEMS: Depression Suicidal thoughts "I'm not suicidal, I had a moment"                     DISCHARGE CRITERIA:  Ability to meet basic life and health needs Improved stabilization in mood, thinking, and/or behavior Reduction of life-threatening or endangering symptoms to within safe limits Verbal commitment to aftercare and medication compliance  PRELIMINARY DISCHARGE PLAN: Attend aftercare/continuing care group Return to previous living arrangement  PATIENT/FAMILY INVOLVEMENT: This treatment plan has been presented to and reviewed with the patient, MANDRELL VANGILDER, and/or family member, .  The patient and family have been given the opportunity to ask questions and make suggestions.  [place], [place], [place] 05/13/2019, 5:26 PM

## 2019-05-13 NOTE — Progress Notes (Signed)
Per Anette Riedel, NP pt is recommended for inpt tx. TTS to seek placement. EDP Rancour, Annie Main, MD and pt's nurse Joellen Jersey, RN have been advised.  Lind Covert, MSW, LCSW Therapeutic Triage Specialist  [phone]

## 2019-05-13 NOTE — ED Notes (Signed)
Pt sitting on the edge of stretcher and cooperative at this time.

## 2019-05-13 NOTE — ED Notes (Signed)
TTS in progress 

## 2019-05-13 NOTE — ED Triage Notes (Signed)
Pt brought in under IVC with [place] [employer] for SI. Pt threatened to give self too much insulin if his significant other left him.

## 2019-05-13 NOTE — ED Notes (Signed)
TTS completed. 

## 2019-05-14 DIAGNOSIS — F332 Major depressive disorder, recurrent severe without psychotic features: Principal | ICD-10-CM

## 2019-05-14 LAB — GLUCOSE, CAPILLARY
Glucose-Capillary: 181 mg/dL — ABNORMAL HIGH (ref 70–99)
Glucose-Capillary: 98 mg/dL (ref 70–99)
Glucose-Capillary: 127 mg/dL — ABNORMAL HIGH (ref 70–99)
Glucose-Capillary: 62 mg/dL — ABNORMAL LOW (ref 70–99)
Glucose-Capillary: 113 mg/dL — ABNORMAL HIGH (ref 70–99)

## 2019-05-14 NOTE — Plan of Care (Signed)
Progress note  D: pt found in bed; compliant with medication administration. Pt is guarded and minimal in his assessment. Pt states he "doesn't remember" with many questions, even how he slept last night. Pt is oriented x4. Pt denies any pain. Pt has been reclusive to his room and resting most of the day. Pt denies si/hi/ah/vh and verbally agrees to approach staff if these become apparent or before harming himself/others while at [facility] Russell: Pt provided support and encouragement. Pt given medication per protocol and standing orders. Q105m safety checks implemented and continued.  R: Pt safe on the unit. Will continue to monitor.  Pt progressing in the following metrics  Problem: Education: Goal: Knowledge of [facility] Education information/materials will improve Outcome: Progressing Goal: Emotional status will improve Outcome: Progressing Goal: Mental status will improve Outcome: Progressing Goal: Verbalization of understanding the information provided will improve Outcome: Progressing

## 2019-05-14 NOTE — Progress Notes (Signed)
Hypoglycemic Event  CBG: 62  Treatment: 8 oz juice/soda  Symptoms: Sweaty and Shaky  Follow-up CBG: Time:2037 CBG Result:113  Possible Reasons for Event: Inadequate meal intake  Comments/MD notified: Continue to monitor    Noel Christmas

## 2019-05-14 NOTE — H&P (Signed)
Psychiatric Admission Assessment Adult  Patient Identification: Kevin Russell L Swoyer MRN:  [id number] Date of Evaluation:  05/14/2019 Chief Complaint:  MDD Principal Diagnosis: <principal problem not specified> Diagnosis:  Active Problems:   MDD (major depressive disorder), recurrent severe, without psychosis (HCC)  History of Present Illness: Patient is seen and examined.  Patient is a 33 year old male with a reported past psychiatric history significant for major depression versus obsessive-compulsive disorder and recurrent suicidal ideation.  He was placed under involuntary commitment today by his separated wife.  Per the involuntary commitment paperwork the patient had threatened to kill himself on the date of admission.  The note suggested that he had told her "if I can have you then I have no need to be here".  He had told her that he had injected a large dose of insulin intentionally to harm himself.  Today he stated he had filled up his syringe, but did not inject the insulin.  The patient stated that he had left his wife in June, and had moved out.  He was not explicit on why that would occur.  He stated his wife was not upset with him moving out.  The patient then attempted to move back in with his wife, but she would not let him return.  She he stated that she had been taking him and picking him up from work, and yesterday she had come to take him home, and that is when all of this occurred.  Review of the electronic medical record revealed a previous psychiatric admission at [facility] on 08/22/2018.  At that time he was admitted secondary to suicidal ideation.  He had reported at that time that he was "going to blow his brains out".  He stated that that time he had been diagnosed with diabetes, and had lost his job in 2019 because of adjustment of his medications.  He stated since that point he had been depressed, having poor sleep, poor appetite, losing weight and feeling  guilty, helpless and hopeless.  At that time he had also complained of having an argument with his wife.  He had been suspicious that she might leave him, and was ruminating over why she would marry him in the first place.  It was felt that he had obsessive-compulsive disorder secondary to constant worries and rumination and worst-case scenarios.  He was started on Abilify and Luvox.  He was only in the hospital for 1 day.  He stated that his wife did come to get him at that time and take him home.  He stated that after discharge she did not continue with medications nor psychiatric follow-up.  He was unsure whether or not the medications were beneficial.  He was admitted to the hospital for evaluation and stabilization.  Associated Signs/Symptoms: Depression Symptoms:  depressed mood, anhedonia, insomnia, psychomotor agitation, fatigue, feelings of worthlessness/guilt, difficulty concentrating, impaired memory, suicidal thoughts with specific plan, anxiety, loss of energy/fatigue, disturbed sleep, (Hypo) Manic Symptoms:  Impulsivity, Irritable Mood, Labiality of Mood, Anxiety Symptoms:  Excessive Worry, Obsessive Compulsive Symptoms:   None,, Psychotic Symptoms:  Hallucinations: Auditory PTSD Symptoms: Negative Total Time spent with patient: 30 minutes  Past Psychiatric History: Patient has had 1 previous psychiatric admission at [facility] on 08/22/2018.  He was there for approximately 1 to 2 days.  He was discharged on Abilify and Luvox.  His diagnosis was depression and possible OCD at that time.  He did not follow-up after discharge.  Is the  patient at risk to self? Yes.    Has the patient been a risk to self in the past 6 months? Yes.    Has the patient been a risk to self within the distant past? No.  Is the patient a risk to others? No.  Has the patient been a risk to others in the past 6 months? No.  Has the patient been a risk to others within the  distant past? No.   Prior Inpatient Therapy:   Prior Outpatient Therapy:    Alcohol Screening: 1. How often do you have a drink containing alcohol?: 2 to 3 times a week 2. How many drinks containing alcohol do you have on a typical day when you are drinking?: 3 or 4 3. How often do you have six or more drinks on one occasion?: Less than monthly AUDIT-C Score: 5 4. How often during the last year have you found that you were not able to stop drinking once you had started?: Never 5. How often during the last year have you failed to do what was normally expected from you becasue of drinking?: Never 6. How often during the last year have you needed a first drink in the morning to get yourself going after a heavy drinking session?: Never 7. How often during the last year have you had a feeling of guilt of remorse after drinking?: Never 8. How often during the last year have you been unable to remember what happened the night before because you had been drinking?: Never 9. Have you or someone else been injured as a result of your drinking?: No 10. Has a relative or friend or a doctor or another health worker been concerned about your drinking or suggested you cut down?: No Alcohol Use Disorder Identification Test Final Score (AUDIT): 5 Alcohol Brief Interventions/Follow-up: AUDIT Score <7 follow-up not indicated Substance Abuse History in the last 12 months:  No. Consequences of Substance Abuse: Negative Previous Psychotropic Medications: Yes  Psychological Evaluations: Yes  Past Medical History:  Past Medical History:  Diagnosis Date  . 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 Suicidal ideation    History reviewed. No pertinent surgical history. Family History: History reviewed. No pertinent family history. Family Psychiatric  History: His father whom he has really had no contact with committed suicide in the past. Tobacco Screening: Have you used any form of tobacco in the  last 30 days? (Cigarettes, Smokeless Tobacco, Cigars, and/or Pipes): Yes Tobacco use, Select all that apply: 5 or more cigarettes per day Are you interested in Tobacco Cessation Medications?: No, patient refused Counseled patient on smoking cessation including recognizing danger situations, developing coping skills and basic information about quitting provided: Refused/Declined practical counseling Social History:  Social History   Substance and Sexual Activity  Alcohol Use Yes   Comment: occasionally     Social History   Substance and Sexual Activity  Drug Use Yes  . Types: Marijuana    Additional Social History:                           Allergies:  No Known Allergies Lab Results:  Results for orders placed or performed during the hospital encounter of 05/13/19 (from the past 48 hour(s))  Hemoglobin A1c     Status: Abnormal   Collection Time: 05/13/19  5:05 PM  Result Value Ref Range   Hgb A1c MFr Bld 9.6 (H) 4.8 - 5.6 %  Comment: (NOTE) Pre diabetes:          5.7%-6.4% Diabetes:              >6.4% Glycemic control for   <7.0% adults with diabetes    Mean Plasma Glucose 228.82 mg/dL    Comment: Performed at [facility], [address] [place], [place] [address]  Glucose, capillary     Status: Abnormal   Collection Time: 05/13/19  5:23 PM  Result Value Ref Range   Glucose-Capillary 176 (H) 70 - 99 mg/dL  Glucose, capillary     Status: Abnormal   Collection Time: 05/13/19  9:14 PM  Result Value Ref Range   Glucose-Capillary 102 (H) 70 - 99 mg/dL   Comment 1 Notify RN    Comment 2 Document in Chart   Glucose, capillary     Status: Abnormal   Collection Time: 05/14/19  5:37 AM  Result Value Ref Range   Glucose-Capillary 181 (H) 70 - 99 mg/dL   Comment 1 Notify RN    Comment 2 Document in Chart     Blood Alcohol level:  Lab Results  Component Value Date   ETH <10 05/13/2019   ETH <10 08/21/2018    Metabolic Disorder Labs:  Lab Results   Component Value Date   HGBA1C 9.6 (H) 05/13/2019   MPG 228.82 05/13/2019   MPG 234.56 05/13/2019   No results found for: PROLACTIN Lab Results  Component Value Date   CHOL 220 (H) 08/23/2018   TRIG 135 08/23/2018   HDL 52 08/23/2018   CHOLHDL 4.2 08/23/2018   VLDL 27 08/23/2018   LDLCALC 141 (H) 08/23/2018    Current Medications: Current Facility-Administered Medications  Medication Dose Route Frequency Provider Last Rate Last Dose  . acetaminophen (TYLENOL) tablet 650 mg  650 mg Oral Q6H PRN Money, Gerlene Burdockravis B, FNP      . alum & mag hydroxide-simeth (MAALOX/MYLANTA) 200-200-20 MG/5ML suspension 30 mL  30 mL Oral Q4H PRN Money, Gerlene Burdockravis B, FNP      . ARIPiprazole (ABILIFY) tablet 10 mg  10 mg Oral Daily Money, Gerlene Burdockravis B, FNP   10 mg at 05/14/19 0818  . fluvoxaMINE (LUVOX) tablet 100 mg  100 mg Oral QHS Money, Deven B, FNP   100 mg at 05/13/19 2146  . hydrOXYzine (ATARAX/VISTARIL) tablet 25 mg  25 mg Oral TID PRN Money, Gerlene Burdockravis B, FNP   25 mg at 05/14/19 0818  . insulin aspart (novoLOG) injection 0-9 Units  0-9 Units Subcutaneous TID WC Money, Gerlene Burdockravis B, FNP   2 Units at 05/14/19 0617  . insulin aspart protamine- aspart (NOVOLOG MIX 70/30) injection 20 Units  20 Units Subcutaneous BID WC Money, Gerlene Burdockravis B, FNP   20 Units at 05/14/19 0818  . magnesium hydroxide (MILK OF MAGNESIA) suspension 30 mL  30 mL Oral Daily PRN Money, Gerlene Burdockravis B, FNP      . traZODone (DESYREL) tablet 100 mg  100 mg Oral QHS PRN Money, Gerlene Burdockravis B, FNP   100 mg at 05/13/19 2146   PTA Medications: Medications Prior to Admission  Medication Sig Dispense Refill Last Dose  . insulin aspart protamine- aspart (NOVOLOG MIX 70/30) (70-30) 100 UNIT/ML injection Inject 0.2 mLs (20 Units total) into the skin 2 (two) times daily with a meal. (Patient taking differently: Inject 15 Units into the skin daily with supper. ) 10 mL 11     Musculoskeletal: Strength & Muscle Tone: within normal limits Gait & Station: normal Patient leans:  N/A  Psychiatric Specialty Exam: Physical Exam  Nursing note and vitals reviewed. Constitutional: He is oriented to person, place, and time. He appears well-developed and well-nourished.  HENT:  Head: Normocephalic and atraumatic.  Respiratory: Effort normal.  Neurological: He is alert and oriented to person, place, and time.    ROS  Blood pressure 111/72, pulse 62, temperature 97.8 F (36.6 C), temperature source Oral, resp. rate 18, height 5\' 10"  (1.778 m), weight 57.6 kg.Body mass index is 18.22 kg/m.  General Appearance: Disheveled  Eye Contact:  Fair  Speech:  Normal Rate  Volume:  Decreased  Mood:  Anxious and Depressed  Affect:  Congruent  Thought Process:  Coherent and Descriptions of Associations: Circumstantial  Orientation:  Full (Time, Place, and Person)  Thought Content:  Logical  Suicidal Thoughts:  Yes.  without intent/plan  Homicidal Thoughts:  No  Memory:  Immediate;   Fair Recent;   Fair Remote;   Fair  Judgement:  Intact  Insight:  Fair  Psychomotor Activity:  Decreased  Concentration:  Concentration: Fair and Attention Span: Fair  Recall:  [employer] of Knowledge:  Fair  Language:  Good  Akathisia:  Negative  Handed:  Right  AIMS (if indicated):     Assets:  Desire for Improvement Resilience  ADL's:  Intact  Cognition:  WNL  Sleep:  Number of Hours: 6.5    Treatment Plan Summary: Daily contact with patient to assess and evaluate symptoms and progress in treatment, Medication management and Plan : Patient is seen and examined.  Patient is a 33 year old male with a probable past psychiatric history significant for major depression as well as generalized anxiety disorder.  He presented for evaluation under involuntary commitment secondary to suicidal ideation.  He will be admitted to the hospital.  He will be integrated into the milieu.  He will be encouraged to attend groups.  On admission he had been restarted on his Abilify and Luvox, and we will  continue that for now.  We will attempt to contact his wife for collateral information.  He stated he is currently living between his mother's as well as his sisters.  He seems to accept the fact that he and his wife will not get back together.  His blood sugar this morning was 181, and on admission was 211.  It is unclear whether or not he took an overdose of insulin or not.  Review of his laboratories revealed his electrolytes outside of his glucose to be within normal limits.  His white count was slightly elevated at 11.3.  His hemoglobin A1c is 9.6 revealing poor control.  His blood alcohol on admission was less than 10, and his drug screen was positive for marijuana.  We will continue his NovoLog 70/30 20 units subcu twice daily as well as a sliding scale to get a better idea of his blood sugar control.  Observation Level/Precautions:  15 minute checks  Laboratory:  Chemistry Profile  Psychotherapy:    Medications:    Consultations:    Discharge Concerns:    Estimated LOS:  Other:     Physician Treatment Plan for Primary Diagnosis: <principal problem not specified> Long Term Goal(s): Improvement in symptoms so as ready for discharge  Short Term Goals: Ability to identify changes in lifestyle to reduce recurrence of condition will improve, Ability to verbalize feelings will improve, Ability to disclose and discuss suicidal ideas, Ability to demonstrate self-control will improve, Ability to identify and develop effective coping behaviors will improve,  Ability to maintain clinical measurements within normal limits will improve and Compliance with prescribed medications will improve  Physician Treatment Plan for Secondary Diagnosis: Active Problems:   MDD (major depressive disorder), recurrent severe, without psychosis (HCC)  Long Term Goal(s): Improvement in symptoms so as ready for discharge  Short Term Goals: Ability to identify changes in lifestyle to reduce recurrence of condition will  improve, Ability to verbalize feelings will improve, Ability to disclose and discuss suicidal ideas, Ability to demonstrate self-control will improve, Ability to identify and develop effective coping behaviors will improve, Ability to maintain clinical measurements within normal limits will improve and Compliance with prescribed medications will improve  I certify that inpatient services furnished can reasonably be expected to improve the patient's condition.    Antonieta PertGreg Lawson Ignatius Kloos, MD 7/20/202011:35 AM

## 2019-05-14 NOTE — Tx Team (Signed)
Interdisciplinary Treatment and Diagnostic Plan Update  05/14/2019 Time of Session:  Kevin Russell MRN: [id number]  Principal Diagnosis: <principal problem not specified>  Secondary Diagnoses: Active Problems:   MDD (major depressive disorder), recurrent severe, without psychosis ([place])   Current Medications:  Current Facility-Administered Medications  Medication Dose Route Frequency Provider Last Rate Last Dose  . acetaminophen (TYLENOL) tablet 650 mg  650 mg Oral Q6H PRN Money, Lowry Ram, FNP      . alum & mag hydroxide-simeth (MAALOX/MYLANTA) 200-200-20 MG/5ML suspension 30 mL  30 mL Oral Q4H PRN Money, Lowry Ram, FNP      . ARIPiprazole (ABILIFY) tablet 10 mg  10 mg Oral Daily Money, Bruk [place]olo, FNP   10 mg at 05/14/19 0818  . fluvoxaMINE (LUVOX) tablet 100 mg  100 mg Oral QHS Money, Evaristo B, FNP   100 mg at 05/13/19 2146  . hydrOXYzine (ATARAX/VISTARIL) tablet 25 mg  25 mg Oral TID PRN Money, Lowry Ram, FNP   25 mg at 05/14/19 0818  . insulin aspart (novoLOG) injection 0-9 Units  0-9 Units Subcutaneous TID WC Money, Lowry Ram, FNP   2 Units at 05/14/19 0617  . insulin aspart protamine- aspart (NOVOLOG MIX 70/30) injection 20 Units  20 Units Subcutaneous BID WC Money, Lowry Ram, FNP   20 Units at 05/14/19 0818  . magnesium hydroxide (MILK OF MAGNESIA) suspension 30 mL  30 mL Oral Daily PRN Money, Lowry Ram, FNP      . traZODone (DESYREL) tablet 100 mg  100 mg Oral QHS PRN Money, Lowry Ram, FNP   100 mg at 05/13/19 2146   PTA Medications: Medications Prior to Admission  Medication Sig Dispense Refill Last Dose  . insulin aspart protamine- aspart (NOVOLOG MIX 70/30) (70-30) 100 UNIT/ML injection Inject 0.2 mLs (20 Units total) into the skin 2 (two) times daily with a meal. (Patient taking differently: Inject 15 Units into the skin daily with supper. ) 10 mL 11     Patient Stressors: Marital or family conflict Medication change or noncompliance  Patient Strengths: Ability for  insight Average or above average intelligence Capable of independent living General fund of knowledge Supportive family/friends  Treatment Modalities: Medication Management, Group therapy, Case management,  1 to 1 session with clinician, Psychoeducation, Recreational therapy.   Physician Treatment Plan for Primary Diagnosis: <principal problem not specified> Long Term Goal(s):     Short Term Goals:    Medication Management: Evaluate patient's response, side effects, and tolerance of medication regimen.  Therapeutic Interventions: 1 to 1 sessions, Unit Group sessions and Medication administration.  Evaluation of Outcomes: Not Met  Physician Treatment Plan for Secondary Diagnosis: Active Problems:   MDD (major depressive disorder), recurrent severe, without psychosis ([place])  Long Term Goal(s):     Short Term Goals:       Medication Management: Evaluate patient's response, side effects, and tolerance of medication regimen.  Therapeutic Interventions: 1 to 1 sessions, Unit Group sessions and Medication administration.  Evaluation of Outcomes: Not Met   RN Treatment Plan for Primary Diagnosis: <principal problem not specified> Long Term Goal(s): Knowledge of disease and therapeutic regimen to maintain health will improve  Short Term Goals: Ability to participate in decision making will improve, Ability to verbalize feelings will improve, Ability to disclose and discuss suicidal ideas, Ability to identify and develop effective coping behaviors will improve and Compliance with prescribed medications will improve  Medication Management: RN will administer medications as ordered by provider, will assess and evaluate patient's  response and provide education to patient for prescribed medication. RN will report any adverse and/or side effects to prescribing provider.  Therapeutic Interventions: 1 on 1 counseling sessions, Psychoeducation, Medication administration, Evaluate responses to  treatment, Monitor vital signs and CBGs as ordered, Perform/monitor CIWA, COWS, AIMS and Fall Risk screenings as ordered, Perform wound care treatments as ordered.  Evaluation of Outcomes: Not Met   LCSW Treatment Plan for Primary Diagnosis: <principal problem not specified> Long Term Goal(s): Safe transition to appropriate next level of care at discharge, Engage patient in therapeutic group addressing interpersonal concerns.  Short Term Goals: Engage patient in aftercare planning with referrals and resources  Therapeutic Interventions: Assess for all discharge needs, 1 to 1 time with Social worker, Explore available resources and support systems, Assess for adequacy in community support network, Educate family and significant other(s) on suicide prevention, Complete Psychosocial Assessment, Interpersonal group therapy.  Evaluation of Outcomes: Not Met   Progress in Treatment: Attending groups: No. Participating in groups: No. Taking medication as prescribed: Yes. Toleration medication: Yes. Family/Significant other contact made: No, will contact:  if patient consents to collateral contacts Patient understands diagnosis: Yes. Discussing patient identified problems/goals with staff: Yes. Medical problems stabilized or resolved: Yes. Denies suicidal/homicidal ideation: Yes. Issues/concerns per patient self-inventory: No. Other:   New problem(s) identified: None   New Short Term/Long Term Goal(s):Detox, medication stabilization, elimination of SI thoughts, development of comprehensive mental wellness plan.    Patient Goals:    Discharge Plan or Barriers: Patient recently admitted. CSW will continue to follow and assess for appropriate referrals and possible discharge planning.    Reason for Continuation of Hospitalization: Anxiety Depression Medication stabilization Suicidal ideation  Estimated Length of Stay:3-5 days   Attendees: Patient: 05/14/2019 9:10 AM  Physician: Dr.  Myles Lipps, MD 05/14/2019 9:10 AM  Nursing: Legrand Como.Chauncey Cruel, RN 05/14/2019 9:10 AM  RN Care Manager: 05/14/2019 9:10 AM  Social Worker: Radonna Ricker, [place] 05/14/2019 9:10 AM  Recreational Therapist:  05/14/2019 9:10 AM  Other:  05/14/2019 9:10 AM  Other:  05/14/2019 9:10 AM  Other: 05/14/2019 9:10 AM    Scribe for Treatment Team: Marylee Floras, [place] 05/14/2019 9:10 AM

## 2019-05-14 NOTE — [facility] Group Notes (Signed)
LCSW Group Therapy Note 05/14/2019 2:31 PM  Type of Therapy and Topic: Group Therapy: Overcoming Obstacles  Participation Level: Did Not Attend  Description of Group:  In this group patients will be encouraged to explore what they see as obstacles to their own wellness and recovery. They will be guided to discuss their thoughts, feelings, and behaviors related to these obstacles. The group will process together ways to cope with barriers, with attention given to specific choices patients can make. Each patient will be challenged to identify changes they are motivated to make in order to overcome their obstacles. This group will be process-oriented, with patients participating in exploration of their own experiences as well as giving and receiving support and challenge from other group members.  Therapeutic Goals: 1. Patient will identify personal and current obstacles as they relate to admission. 2. Patient will identify barriers that currently interfere with their wellness or overcoming obstacles.  3. Patient will identify feelings, thought process and behaviors related to these barriers. 4. Patient will identify two changes they are willing to make to overcome these obstacles:   Summary of Patient Progress  Invited, chose not to attend.    Therapeutic Modalities:  Cognitive Behavioral Therapy Solution Focused Therapy Motivational Interviewing Relapse Prevention Therapy   Theresa Duty Clinical Social Worker

## 2019-05-14 NOTE — [facility] Suicide Risk Assessment (Signed)
[facility] Admission Suicide Risk Assessment   Nursing information obtained from:  Patient Demographic factors:  Male, Adolescent or young adult, Caucasian, Low socioeconomic status Current Mental Status:  NA Loss Factors:  Loss of significant relationship Historical Factors:  Prior suicide attempts, Family history of mental illness or substance abuse Risk Reduction Factors:  Positive therapeutic relationship, Positive coping skills or problem solving skills  Total Time spent with patient: 30 minutes Principal Problem: <principal problem not specified> Diagnosis:  Active Problems:   MDD (major depressive disorder), recurrent severe, without psychosis ([place])  Subjective Data: Patient is seen and examined.  Patient is a 33 year old male with a reported past psychiatric history significant for major depression versus obsessive-compulsive disorder and recurrent suicidal ideation.  He was placed under involuntary commitment today by his separated wife.  Per the involuntary commitment paperwork the patient had threatened to kill himself on the date of admission.  The note suggested that he had told her "if I can have you then I have no need to be here".  He had told her that he had injected a large dose of insulin intentionally to harm himself.  Today he stated he had filled up his syringe, but did not inject the insulin.  The patient stated that he had left his wife in June, and had moved out.  He was not explicit on why that would occur.  He stated his wife was not upset with him moving out.  The patient then attempted to move back in with his wife, but she would not let him return.  She he stated that she had been taking him and picking him up from work, and yesterday she had come to take him home, and that is when all of this occurred.  Review of the electronic medical record revealed a previous psychiatric admission at [facility] on 08/22/2018.  At that time he was admitted secondary to  suicidal ideation.  He had reported at that time that he was "going to blow his brains out".  He stated that that time he had been diagnosed with diabetes, and had lost his job in 2019 because of adjustment of his medications.  He stated since that point he had been depressed, having poor sleep, poor appetite, losing weight and feeling guilty, helpless and hopeless.  At that time he had also complained of having an argument with his wife.  He had been suspicious that she might leave him, and was ruminating over why she would marry him in the first place.  It was felt that he had obsessive-compulsive disorder secondary to constant worries and rumination and worst-case scenarios.  He was started on Abilify and Luvox.  He was only in the hospital for 1 day.  He stated that his wife did come to get him at that time and take him home.  He stated that after discharge she did not continue with medications nor psychiatric follow-up.  He was unsure whether or not the medications were beneficial.  He was admitted to the hospital for evaluation and stabilization.  Continued Clinical Symptoms:  Alcohol Use Disorder Identification Test Final Score (AUDIT): 5 The "Alcohol Use Disorders Identification Test", Guidelines for Use in Primary Care, Second Edition.  World Pharmacologist [facility]). Score between 0-7:  no or low risk or alcohol related problems. Score between 8-15:  moderate risk of alcohol related problems. Score between 16-19:  high risk of alcohol related problems. Score 20 or above:  warrants further diagnostic evaluation for  alcohol dependence and treatment.   CLINICAL FACTORS:   Severe Anxiety and/or Agitation Depression:   Anhedonia Hopelessness Impulsivity Insomnia Obsessive-Compulsive Disorder   Musculoskeletal: Strength & Muscle Tone: within normal limits Gait & Station: normal Patient leans: N/A  Psychiatric Specialty Exam: Physical Exam  Nursing note and vitals  reviewed. Constitutional: He is oriented to person, place, and time. He appears well-developed and well-nourished.  HENT:  Head: Normocephalic and atraumatic.  Respiratory: Effort normal.  Neurological: He is alert and oriented to person, place, and time.    ROS  Blood pressure 111/72, pulse 62, temperature 97.8 F (36.6 C), temperature source Oral, resp. rate 18, height 5\' 10"  (1.778 m), weight 57.6 kg.Body mass index is 18.22 kg/m.  General Appearance: Disheveled  Eye Contact:  Fair  Speech:  Normal Rate  Volume:  Decreased  Mood:  Anxious, Depressed and Dysphoric  Affect:  Congruent  Thought Process:  Coherent and Descriptions of Associations: Circumstantial  Orientation:  Full (Time, Place, and Person)  Thought Content:  Rumination  Suicidal Thoughts:  No  Homicidal Thoughts:  No  Memory:  Immediate;   Fair Recent;   Fair Remote;   Fair  Judgement:  Fair  Insight:  Lacking  Psychomotor Activity:  Normal  Concentration:  Concentration: Fair and Attention Span: Fair  Recall:  [employer]Fair  Fund of Knowledge:  Fair  Language:  Fair  Akathisia:  Negative  Handed:  Right  AIMS (if indicated):     Assets:  Desire for Improvement Resilience  ADL's:  Intact  Cognition:  WNL  Sleep:  Number of Hours: 6.5      COGNITIVE FEATURES THAT CONTRIBUTE TO RISK:  None    SUICIDE RISK:   Minimal: No identifiable suicidal ideation.  Patients presenting with no risk factors but with morbid ruminations; may be classified as minimal risk based on the severity of the depressive symptoms  PLAN OF CARE: Patient is seen and examined.  Patient is a 33 year old male with a probable past psychiatric history significant for major depression as well as generalized anxiety disorder.  He presented for evaluation under involuntary commitment secondary to suicidal ideation.  He will be admitted to the hospital.  He will be integrated into the milieu.  He will be encouraged to attend groups.  On admission he  had been restarted on his Abilify and Luvox, and we will continue that for now.  We will attempt to contact his wife for collateral information.  He stated he is currently living between his mother's as well as his sisters.  He seems to accept the fact that he and his wife will not get back together.  His blood sugar this morning was 181, and on admission was 211.  It is unclear whether or not he took an overdose of insulin or not.  Review of his laboratories revealed his electrolytes outside of his glucose to be within normal limits.  His white count was slightly elevated at 11.3.  His hemoglobin A1c is 9.6 revealing poor control.  His blood alcohol on admission was less than 10, and his drug screen was positive for marijuana.  We will continue his NovoLog 70/30 20 units subcu twice daily as well as a sliding scale to get a better idea of his blood sugar control.  I certify that inpatient services furnished can reasonably be expected to improve the patient's condition.   Antonieta PertGreg Lawson Taja Pentland, MD 05/14/2019, 9:58 AM

## 2019-05-14 NOTE — [facility] Group Notes (Signed)
Pt did not attend wrap up group this evening. Pt was in bed asleep.

## 2019-05-15 LAB — GLUCOSE, CAPILLARY
Glucose-Capillary: 122 mg/dL — ABNORMAL HIGH (ref 70–99)
Glucose-Capillary: 196 mg/dL — ABNORMAL HIGH (ref 70–99)
Glucose-Capillary: 223 mg/dL — ABNORMAL HIGH (ref 70–99)
Glucose-Capillary: 116 mg/dL — ABNORMAL HIGH (ref 70–99)

## 2019-05-15 NOTE — Progress Notes (Signed)
Patient ID: Kevin Russell, male   DOB: [birth year], 32 y.o.   MRN: [id number] D: Pt is calm and cooperative,denies SI/HI/AVH, given self inventory sheet, but did not complete it .  Pt refused his AM scheduled dose of insulin, stating he does not take insulin at home in the morning and also did not eat breakfast per his choice.    A: Pt being given his meds as scheduled, and is being maintained on Q15 minute checks for safety.  R:  Will continue to monitor on Q15 minute checks

## 2019-05-15 NOTE — Plan of Care (Addendum)
D: Patient is alert and cooperative. Denies SI, HI, AVH, and verbally contracts for safety.    Hypoglycemic Event, CBG: 62, Treatment: 8 oz juice/soda, Symptoms: Sweaty and Shaky, Follow-up CBG: Time:2037    CBG Result:113, Possible Reasons for Event: Inadequate meal intake, Comments/MD notified: Continue to monitor   A: Medications administered per MD order. Support provided. Patient educated on safety on the unit and medications. Routine safety checks every 15 minutes. Patient stated understanding to tell nurse about any new physical symptoms. Patient understands to tell staff of any needs.     R: No adverse drug reactions noted. Patient verbally contracts for safety. Patient remains safe at this time and will continue to monitor.   Problem: Education: Goal: Knowledge of [facility] Education information/materials will improve Outcome: Progressing    [place] NOVEL CORONAVIRUS (COVID-19) DAILY CHECK-OFF SYMPTOMS - answer yes or no to each - every day NO YES  Have you had a fever in the past 24 hours?  . Fever (Temp > 37.80C / 100F) X   Have you had any of these symptoms in the past 24 hours? . New Cough .  Sore Throat  .  Shortness of Breath .  Difficulty Breathing .  Unexplained Body Aches   X   Have you had any one of these symptoms in the past 24 hours not related to allergies?   . Runny Nose .  Nasal Congestion .  Sneezing   X   If you have had runny nose, nasal congestion, sneezing in the past 24 hours, has it worsened?  X   EXPOSURES - check yes or no X   Have you traveled outside the state in the past 14 days?  X   Have you been in contact with someone with a confirmed diagnosis of COVID-19 or PUI in the past 14 days without wearing appropriate PPE?  X   Have you been living in the same home as a person with confirmed diagnosis of COVID-19 or a PUI (household contact)?    X   Have you been diagnosed with COVID-19?    X              What to do next: Answered  NO to all: Answered YES to anything:   Proceed with unit schedule Follow the [facility] Inpatient Flowsheet.

## 2019-05-15 NOTE — Progress Notes (Signed)
[facility] MD Progress Note  05/15/2019 3:54 PM Kevin Russell  MRN:  [id number] Subjective:  Reports he is feeling better. He reports that prior to admission" I made a statement I should not have made", in the context of marital stressors.  Objective : I have discussed case with treatment team and met with patient. 33 year old male, admitted under commitment for suicidal ideations, with reports that he had threatened to overdose on insulin. He reports he has been depressed recently in the context of marital stressors/separation in June 2020, and endorses neuro-vegetative symptoms including poor energy level, decreased sleep, some anhedonia/decreased sense of self esteem. He reports he was not experiencing suicidal ideations prior to day of admission. Regarding suicidal statement prior to admission, states he said it to his wife while she was driving him from work to his home. States " I guess I just wanted for my wife not to leave ".  Reports that today he is feeling better, partly because he had a good/supportive phone conversation with his wife earlier today.  CBG today 122 and 196.  Currently on Abilify and Luvox- denies medication side effects thus far.  Principal Problem: MDD Diagnosis: Active Problems:   MDD (major depressive disorder), recurrent severe, without psychosis ([place])  Total Time spent with patient: 20 minutes  Past Psychiatric History:   Past Medical History:  Past Medical History:  Diagnosis Date  . 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 Suicidal ideation    History reviewed. No pertinent surgical history. Family History: History reviewed. No pertinent family history. Family Psychiatric  History:  Social History:  Social History   Substance and Sexual Activity  Alcohol Use Yes   Comment: occasionally     Social History   Substance and Sexual Activity  Drug Use Yes  . Types: Marijuana    Social History   Socioeconomic History  . Marital  status: Single    Spouse name: Not on file  . Number of children: Not on file  . Years of education: Not on file  . Highest education level: Not on file  Occupational History  . Not on file  Social Needs  . Financial resource strain: Not on file  . Food insecurity    Worry: Not on file    Inability: Not on file  . Transportation needs    Medical: Not on file    Non-medical: Not on file  Tobacco Use  . Smoking status: Current Some Day Smoker  . Smokeless tobacco: Never Used  Substance and Sexual Activity  . Alcohol use: Yes    Comment: occasionally  . Drug use: Yes    Types: Marijuana  . Sexual activity: Yes  Lifestyle  . Physical activity    Days per week: Not on file    Minutes per session: Not on file  . Stress: Not on file  Relationships  . Social Herbalist on phone: Not on file    Gets together: Not on file    Attends religious service: Not on file    Active member of club or organization: Not on file    Attends meetings of clubs or organizations: Not on file    Relationship status: Not on file  Other Topics Concern  . Not on file  Social History Narrative  . Not on file   Additional Social History:   Sleep: Fair  Appetite:  Good  Current Medications: Current Facility-Administered Medications  Medication Dose  Route Frequency Provider Last Rate Last Dose  . acetaminophen (TYLENOL) tablet 650 mg  650 mg Oral Q6H PRN Money, Lowry Ram, FNP      . alum & mag hydroxide-simeth (MAALOX/MYLANTA) 200-200-20 MG/5ML suspension 30 mL  30 mL Oral Q4H PRN Money, Lowry Ram, FNP      . ARIPiprazole (ABILIFY) tablet 10 mg  10 mg Oral Daily Money, Harlen Danford, FNP   10 mg at 05/15/19 3532  . fluvoxaMINE (LUVOX) tablet 100 mg  100 mg Oral QHS Money, Ryheem B, FNP   100 mg at 05/14/19 2201  . hydrOXYzine (ATARAX/VISTARIL) tablet 25 mg  25 mg Oral TID PRN Money, Lowry Ram, FNP   25 mg at 05/14/19 2201  . insulin aspart (novoLOG) injection 0-9 Units  0-9 Units Subcutaneous  TID WC Money, Lowry Ram, FNP   1 Units at 05/14/19 1725  . insulin aspart protamine- aspart (NOVOLOG MIX 70/30) injection 20 Units  20 Units Subcutaneous BID WC Money, Lowry Ram, FNP   20 Units at 05/14/19 1725  . magnesium hydroxide (MILK OF MAGNESIA) suspension 30 mL  30 mL Oral Daily PRN Money, Lowry Ram, FNP      . traZODone (DESYREL) tablet 100 mg  100 mg Oral QHS PRN Money, Lowry Ram, FNP   100 mg at 05/14/19 2201    Lab Results:  Results for orders placed or performed during the hospital encounter of 05/13/19 (from the past 48 hour(s))  Hemoglobin A1c     Status: Abnormal   Collection Time: 05/13/19  5:05 PM  Result Value Ref Range   Hgb A1c MFr Bld 9.6 (H) 4.8 - 5.6 %    Comment: (NOTE) Pre diabetes:          5.7%-6.4% Diabetes:              >6.4% Glycemic control for   <7.0% adults with diabetes    Mean Plasma Glucose 228.82 mg/dL    Comment: Performed at [facility], [place] [address] [place], [place] [address]  Glucose, capillary     Status: Abnormal   Collection Time: 05/13/19  5:23 PM  Result Value Ref Range   Glucose-Capillary 176 (H) 70 - 99 mg/dL  Glucose, capillary     Status: Abnormal   Collection Time: 05/13/19  9:14 PM  Result Value Ref Range   Glucose-Capillary 102 (H) 70 - 99 mg/dL   Comment 1 Notify RN    Comment 2 Document in Chart   Glucose, capillary     Status: Abnormal   Collection Time: 05/14/19  5:37 AM  Result Value Ref Range   Glucose-Capillary 181 (H) 70 - 99 mg/dL   Comment 1 Notify RN    Comment 2 Document in Chart   Glucose, capillary     Status: None   Collection Time: 05/14/19 11:42 AM  Result Value Ref Range   Glucose-Capillary 98 70 - 99 mg/dL  Glucose, capillary     Status: Abnormal   Collection Time: 05/14/19  5:16 PM  Result Value Ref Range   Glucose-Capillary 127 (H) 70 - 99 mg/dL  Glucose, capillary     Status: Abnormal   Collection Time: 05/14/19  8:21 PM  Result Value Ref Range   Glucose-Capillary 62 (L) 70 - 99 mg/dL   Glucose, capillary     Status: Abnormal   Collection Time: 05/14/19  8:37 PM  Result Value Ref Range   Glucose-Capillary 113 (H) 70 - 99 mg/dL   Comment 1  Notify RN    Comment 2 Document in Chart   Glucose, capillary     Status: Abnormal   Collection Time: 05/15/19  5:45 AM  Result Value Ref Range   Glucose-Capillary 122 (H) 70 - 99 mg/dL   Comment 1 Notify RN    Comment 2 Document in Chart   Glucose, capillary     Status: Abnormal   Collection Time: 05/15/19 12:11 PM  Result Value Ref Range   Glucose-Capillary 196 (H) 70 - 99 mg/dL   Comment 1 Notify RN    Comment 2 Document in Chart     Blood Alcohol level:  Lab Results  Component Value Date   ETH <10 05/13/2019   ETH <10 04/59/9774    Metabolic Disorder Labs: Lab Results  Component Value Date   HGBA1C 9.6 (H) 05/13/2019   MPG 228.82 05/13/2019   MPG 234.56 05/13/2019   No results found for: PROLACTIN Lab Results  Component Value Date   CHOL 220 (H) 08/23/2018   TRIG 135 08/23/2018   HDL 52 08/23/2018   CHOLHDL 4.2 08/23/2018   VLDL 27 08/23/2018   LDLCALC 141 (H) 08/23/2018    Physical Findings: AIMS: Facial and Oral Movements Muscles of Facial Expression: None, normal Lips and Perioral Area: None, normal Jaw: None, normal Tongue: None, normal,Extremity Movements Upper (arms, wrists, hands, fingers): None, normal Lower (legs, knees, ankles, toes): None, normal, Trunk Movements Neck, shoulders, hips: None, normal, Overall Severity Severity of abnormal movements (highest score from questions above): None, normal Incapacitation due to abnormal movements: None, normal Patient's awareness of abnormal movements (rate only patient's report): No Awareness, Dental Status Current problems with teeth and/or dentures?: No Does patient usually wear dentures?: No  CIWA:    COWS:     Musculoskeletal: Strength & Muscle Tone: within normal limits Gait & Station: normal Patient leans: N/A  Psychiatric Specialty  Exam: Physical Exam  ROSno headache , no chest pain, no cough, no fever or chills   Blood pressure (!) 120/103, pulse (!) 103, temperature 97.6 F (36.4 C), temperature source Oral, resp. rate 16, height '5\' 10"'  (1.778 m), weight 57.6 kg.Body mass index is 18.22 kg/m.  General Appearance: Well Groomed  Eye Contact:  Good  Speech:  Normal Rate  Volume:  Decreased  Mood:  reports feeling better, presents vaguely depressed   Affect:  slightly constricted, but reactive, improves during session  Thought Process:  Linear and Descriptions of Associations: Intact  Orientation:  Other:  fully alert and attentive  Thought Content:  no hallucinations,no delusions  Suicidal Thoughts:  No denies any suicidal or self injurious ideations at this time, denies homicidal or violent ideations, contracts for safety on unit   Homicidal Thoughts:  No  Memory:  recent and remote grossly intact   Judgement:  Other:  fair- improving   Insight:  improving   Psychomotor Activity:  Normal  Concentration:  Concentration: Good and Attention Span: Good  Recall:  Good  Fund of Knowledge:  Good  Language:  Good  Akathisia:  Negative  Handed:  Right  AIMS (if indicated):     Assets:  Desire for Improvement Resilience  ADL's:  Intact  Cognition:  WNL  Sleep:  Number of Hours: 6.75    Assessment -  Today patient reports improving mood and states he is feeling better than prior to admission. Currently endorses depression leading up to admission in the context of marital stressors, but denies having had actual suicidal plans or intentions and states  suicidal statement was made in the context of marital conflict. Currently denies SI, and identifies love for his children as protective factor against suicide. Tolerating medications ( Luvox , Abilify) well thus far .    Treatment Plan Summary: Daily contact with patient to assess and evaluate symptoms and progress in treatment, Medication management, Plan inpatient  treatment and medications as below Encourage group and milieu participation to work on coping skills and symptom reduction Continue Abilify 10 mgrs QDAY for mood disorder Continue Luvox 100 mgrs QDAY for depression Continue Trazodone at 50 mgrs QHS PRN for insomnia Continue Vistaril 25 mgrs Q 8 hours PRN for anxiety Continue Insulin Novolog Mix 20 units [place] BID and Novolog sliding scale for DM Treatment team working on disposition planning options. Jenne Campus, MD 05/15/2019, 3:54 PM

## 2019-05-15 NOTE — Progress Notes (Signed)
Inpatient Diabetes Program Recommendations  AACE/ADA: New Consensus Statement on Inpatient Glycemic Control (2015)  Target Ranges:  Prepandial:   less than 140 mg/dL      Peak postprandial:   less than 180 mg/dL (1-2 hours)      Critically ill patients:  140 - 180 mg/dL   Results for KAULIN, CHAVES (MRN [id number]) as of 05/15/2019 08:45  Ref. Range 05/14/2019 11:42 05/14/2019 17:16 05/14/2019 20:21 05/14/2019 20:37 05/15/2019 05:45  Glucose-Capillary Latest Ref Range: 70 - 99 mg/dL 98 127 (H) 62 (L) 113 (H) 122 (H)    Review of Glycemic Control  Diabetes history: DM 1 Outpatient Diabetes medications: 70/30 15 units Daily at supper Current orders for Inpatient glycemic control: 70/30 20 units bid, Novolog 0-9 units tid  A1c 9.6% on 7/19  Inpatient Diabetes Program Recommendations:    Hypoglycemia last night 62 mg/dl. Per RN poor po intake with supper. If patient continues to have poor PO intake consider decreasing 70/30 to 15 units bid.  Thanks,  Tama Headings RN, MSN, BC-ADM Inpatient Diabetes Coordinator Team Pager [phone] (8a-5p)

## 2019-05-15 NOTE — [facility] Counselor (Signed)
Adult Comprehensive Assessment  Patient ID: Kevin Russell, male   DOB: 06-29-1986, 33 y.o.   MRN: [id number]  Information Source: Information source: Patient  Current Stressors:  Patient states their primary concerns and needs for treatment are:: "I said some things I shouldnt have" Depression, anxiety and suicidal ideation Patient states their goals for this hospitilization and ongoing recovery are:: "I have no clue" Educational / Learning stressors: N/A Employment / Job issues: Employed; Patient denies any current stressors Family Relationships: Patient denies any current stressors Financial / Lack of resources (include bankruptcy): Reports experiencing financial strain Housing / Lack of housing: Interchanges between living with his mother and his sister currently; Reports his estranged wife recently kicked him out of their home prior to coming to the hospital Physical health (include injuries & life threatening diseases): Reports having diabetes; Patient denies any current stressors Social relationships: Patient denies any current stressors Substance abuse: Patient denies any current stressors Bereavement / Loss: Patient denies any current stressors  Living/Environment/Situation:  Living Arrangements: Other relatives, Parent Living conditions (as described by patient or guardian): "Okay" Who else lives in the home?: Interchanges between living with his mother and his sister currently; Reports his estranged wife recently kicked him out of their home prior to coming to the hospital How long has patient lived in current situation?: 1 month What is atmosphere in current home: Supportive  Family History:  Marital status: Married Number of Years Married: 4 What types of issues is patient dealing with in the relationship?: Reports he and his wife relationship has been strained; Reports they are currently seperated; States "we are ending because of me and my thoughts. I am very  insecure" Additional relationship information: No Are you sexually active?: Yes What is your sexual orientation?: Heterosexual Has your sexual activity been affected by drugs, alcohol, medication, or emotional stress?: No Does patient have children?: Yes How many children?: 3 How is patient's relationship with their children?: Patient reports having a good relationship with his 16yo son, 11yo son and 7yo daughter  Childhood History:  By whom was/is the patient raised?: Mother Additional childhood history information: Reports his mother worked "a lot"; He also states that his father was not present. Description of patient's relationship with caregiver when they were a child: Reports having a "bad" relationship with his mother due to their distant relationship and neglect Patient's description of current relationship with people who raised him/her: Reports having an "okay" relationship with his mother currently How were you disciplined when you got in trouble as a child/adolescent?: Whoopings/Verbally Does patient have siblings?: Yes Number of Siblings: 2 Description of patient's current relationship with siblings: Reports having a good relationship with his younger brother and sister Did patient suffer any verbal/emotional/physical/sexual abuse as a child?: No Did patient suffer from severe childhood neglect?: Yes Patient description of severe childhood neglect: Reports having no water, no electricity and food insecurities during his childhood while living with mother Has patient ever been sexually abused/assaulted/raped as an adolescent or adult?: No Was the patient ever a victim of a crime or a disaster?: No Witnessed domestic violence?: No Has patient been effected by domestic violence as an adult?: No  Education:  Highest grade of school patient has completed: 8th grade Currently a student?: No Learning disability?: No  Employment/Work Situation:   Employment situation:  Employed Where is patient currently employed?: [employer] How long has patient been employed?: 1 week Patient's job has been impacted by current illness: No What is the longest  time patient has a held a job?: Patient reports he is unsure. Where was the patient employed at that time?: Patient reports he is unsure. Did You Receive Any Psychiatric Treatment/Services While in [place]?: No Are There Guns or Other Weapons in [place]?: No  Financial Resources:   Financial resources: Income from employment Does patient have a representative payee or guardian?: No  Alcohol/Substance Abuse:   What has been your use of drugs/alcohol within the last 12 months?: Denies If attempted suicide, did drugs/alcohol play a role in this?: No Alcohol/Substance Abuse Treatment Hx: Denies past history Has alcohol/substance abuse ever caused legal problems?: No  Social Support System:   Heritage manager System: Poor Describe Community Support System: None Type of faith/religion: N/A How does patient's faith help to cope with current illness?: None  Leisure/Recreation:   Leisure and Hobbies: "Nothing"  Strengths/Needs:   What is the patient's perception of their strengths?: "I dont know" Patient states they can use these personal strengths during their treatment to contribute to their recovery: To be determined Patient states these barriers may affect/interfere with their treatment: No Patient states these barriers may affect their return to the community: No  Discharge Plan:   Currently receiving community mental health services: No Patient states concerns and preferences for aftercare planning are: Expressed interest in medication management and therapy services at discharge Patient states they will know when they are safe and ready for discharge when: To be determined Does patient have access to transportation?: Yes Does patient have financial barriers related to discharge  medications?: Yes Patient description of barriers related to discharge medications: No health insurance Will patient be returning to same living situation after discharge?: Yes  Summary/Recommendations:   Summary and Recommendations (to be completed by the evaluator): Kevin Russell is a 33 year old male who is diagnosed with MDD (major depressive disorder), recurrent severe, without psychosis. He presented to the hospital seeking treatment for recurrent suicidal ideation. During the assessment, Kevin Russell presented with a flat, depressed affect and layed in the bed during the entire assessment. He was very minimal, however provided information. Kevin Russell reports that he came to the hospital because he "said things he shouldn't", he also stated that he had suicidal ideation prior to coming to the hospital. Kevin Russell states that he was "forced" to come to the hospital after having an altercation with his estranged wife who recently kicked him out of her home. Kevin Russell reports he is agreeable with being referred to an outpatient providers. Kevin Russell can benefit from crisis stabilization, medication management, therapeutic milieu and referral services.  Kevin Russell. 05/15/2019

## 2019-05-15 NOTE — Progress Notes (Signed)
D: Pt denies SI/HI/AVH. Pt is pleasant and cooperative. Pt stated he was doing better due to talking with his wife today  A: Pt was offered support and encouragement. Pt was given scheduled medications. Pt was encourage to attend groups. Q 15 minute checks were done for safety.  R:Pt attends groups and interacts well with peers and staff. Pt is taking medication. Pt has no complaints.Pt receptive to treatment and safety maintained on unit.  Problem: Education: Goal: Emotional status will improve Outcome: Progressing   Problem: Education: Goal: Mental status will improve Outcome: Progressing

## 2019-05-16 LAB — GLUCOSE, CAPILLARY
Glucose-Capillary: 144 mg/dL — ABNORMAL HIGH (ref 70–99)
Glucose-Capillary: 115 mg/dL — ABNORMAL HIGH (ref 70–99)
Glucose-Capillary: 145 mg/dL — ABNORMAL HIGH (ref 70–99)
Glucose-Capillary: 174 mg/dL — ABNORMAL HIGH (ref 70–99)
Glucose-Capillary: 75 mg/dL (ref 70–99)

## 2019-05-16 MED ORDER — HYDROXYZINE HCL 25 MG PO TABS: 25.0000 mg | ORAL_TABLET | Freq: Four times a day (QID) | ORAL | Status: DC | PRN

## 2019-05-16 MED ORDER — TRAZODONE HCL 100 MG PO TABS
100.0000 mg | ORAL_TABLET | Freq: Once | ORAL | Status: AC
  Administered 2019-05-16: 100 mg via ORAL
  Filled 2019-05-16 (×2): qty 1

## 2019-05-16 MED ORDER — FLUVOXAMINE MALEATE 100 MG PO TABS
100.0000 mg | ORAL_TABLET | Freq: Every day | ORAL | Status: DC
  Administered 2019-05-17: 100 mg via ORAL
  Filled 2019-05-16 (×3): qty 1

## 2019-05-16 MED ORDER — TRAZODONE HCL 50 MG PO TABS
50.0000 mg | ORAL_TABLET | Freq: Every evening | ORAL | Status: DC | PRN
  Administered 2019-05-16: 50 mg via ORAL
  Filled 2019-05-16: qty 1

## 2019-05-16 NOTE — Progress Notes (Signed)
Pt came up to NS requesting CBG be taken; Pt states "It feels low". CBG was 174 @ 2020. CBG currently at 75. Snacks and fluids given. Will monitor.

## 2019-05-16 NOTE — Progress Notes (Signed)
[place] NOVEL CORONAVIRUS (COVID-19) DAILY CHECK-OFF SYMPTOMS - answer yes or no to each - every day NO YES  Have you had a fever in the past 24 hours?  . Fever (Temp > 37.80C / 100F) X   Have you had any of these symptoms in the past 24 hours? . New Cough .  Sore Throat  .  Shortness of Breath .  Difficulty Breathing .  Unexplained Body Aches   X   Have you had any one of these symptoms in the past 24 hours not related to allergies?   . Runny Nose .  Nasal Congestion .  Sneezing   X   If you have had runny nose, nasal congestion, sneezing in the past 24 hours, has it worsened?  X   EXPOSURES - check yes or no X   Have you traveled outside the state in the past 14 days?  X   Have you been in contact with someone with a confirmed diagnosis of COVID-19 or PUI in the past 14 days without wearing appropriate PPE?  X   Have you been living in the same home as a person with confirmed diagnosis of COVID-19 or a PUI (household contact)?    X   Have you been diagnosed with COVID-19?    X              What to do next: Answered NO to all: Answered YES to anything:   Proceed with unit schedule Follow the [facility] Inpatient Flowsheet.   

## 2019-05-16 NOTE — Progress Notes (Signed)
Pt observe pacing the hallway; He presents with a depressed/anxious affect and mood. He denies SI/HI/AVH at this time. No new c/o's as Pt appears guarded with interaction.Support offered. Safety maintained.

## 2019-05-16 NOTE — Plan of Care (Signed)
Nurse discussed anxiety, depression, coping skills with patient. 

## 2019-05-16 NOTE — Progress Notes (Signed)
D:  Patient denied SI and HI, contracts for safety.  Denied A/V hallucinations.  Denied pain. A:  Medications administered per MD orders.  Emotional support and encouragement given patient. R:  Safety maintained with 15 minute checks.  Patient informed that his wife called concerning his status.

## 2019-05-16 NOTE — Progress Notes (Addendum)
[facility] MD Progress Note  05/16/2019 3:26 PM RASHAWN RAYMAN  MRN:  [id number] Subjective: Reports improvement compared to admission.  Denies suicidal ideations.  Denies medication side effects. Objective : I have discussed case with treatment team and met with patient. 33 year old male, admitted under commitment for suicidal ideations, with reports that he had threatened to overdose on insulin. He reports he has been depressed recently in the context of marital stressors/separation in June 2020, and endorses neuro-vegetative symptoms including poor energy level, decreased sleep, some anhedonia/decreased sense of self esteem. He reports he was not experiencing suicidal ideations prior to day of admission. Regarding suicidal statement prior to admission, states he said it to his wife while she was driving him from work to his home. States " I guess I just wanted for my wife not to leave ".   Patient presents alert, attentive, pleasant on approach.  Reports his mood is "better".  At this time denies suicidal ideations and continues to describe recent suicidal statement prior to admission as "just something I said to get her attention" (referring to wife) .  States "I do not want to die, I have a lot to live for" and establishes love and sense of responsibility towards his 3 children as a protective factor. He is currently presenting future oriented, hoping to be discharged soon in order to return to work. No disruptive or agitated behaviors on unit, pleasant and cooperative on approach. Tolerating medications well.  Currently on Luvox and Abilify. CBGs  today 144 and 115  Principal Problem: MDD Diagnosis: Active Problems:   MDD (major depressive disorder), recurrent severe, without psychosis ([place])  Total Time spent with patient: 20 minutes  Past Psychiatric History:   Past Medical History:  Past Medical History:  Diagnosis Date  . 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 Suicidal  ideation    History reviewed. No pertinent surgical history. Family History: History reviewed. No pertinent family history. Family Psychiatric  History:  Social History:  Social History   Substance and Sexual Activity  Alcohol Use Yes   Comment: occasionally     Social History   Substance and Sexual Activity  Drug Use Yes  . Types: Marijuana    Social History   Socioeconomic History  . Marital status: Single    Spouse name: Not on file  . Number of children: Not on file  . Years of education: Not on file  . Highest education level: Not on file  Occupational History  . Not on file  Social Needs  . Financial resource strain: Not on file  . Food insecurity    Worry: Not on file    Inability: Not on file  . Transportation needs    Medical: Not on file    Non-medical: Not on file  Tobacco Use  . Smoking status: Current Some Day Smoker  . Smokeless tobacco: Never Used  Substance and Sexual Activity  . Alcohol use: Yes    Comment: occasionally  . Drug use: Yes    Types: Marijuana  . Sexual activity: Yes  Lifestyle  . Physical activity    Days per week: Not on file    Minutes per session: Not on file  . Stress: Not on file  Relationships  . Social Herbalist on phone: Not on file    Gets together: Not on file    Attends religious service: Not on file    Active member of  club or organization: Not on file    Attends meetings of clubs or organizations: Not on file    Relationship status: Not on file  Other Topics Concern  . Not on file  Social History Narrative  . Not on file   Additional Social History:   Sleep: Improved  Appetite:  Good  Current Medications: Current Facility-Administered Medications  Medication Dose Route Frequency Provider Last Rate Last Dose  . acetaminophen (TYLENOL) tablet 650 mg  650 mg Oral Q6H PRN Money, Lowry Ram, FNP      . alum & mag hydroxide-simeth (MAALOX/MYLANTA) 200-200-20 MG/5ML suspension 30 mL  30 mL Oral Q4H  PRN Money, Lowry Ram, FNP      . ARIPiprazole (ABILIFY) tablet 10 mg  10 mg Oral Daily Money, Makoto Sellitto, FNP   10 mg at 05/16/19 0753  . fluvoxaMINE (LUVOX) tablet 100 mg  100 mg Oral QHS Money, Axle B, FNP   100 mg at 05/15/19 2243  . hydrOXYzine (ATARAX/VISTARIL) tablet 25 mg  25 mg Oral TID PRN Money, Lowry Ram, FNP   25 mg at 05/15/19 2243  . insulin aspart (novoLOG) injection 0-9 Units  0-9 Units Subcutaneous TID WC Money, Lowry Ram, FNP   1 Units at 05/16/19 [id number]  . insulin aspart protamine- aspart (NOVOLOG MIX 70/30) injection 20 Units  20 Units Subcutaneous BID WC Money, Lowry Ram, FNP   20 Units at 05/16/19 0754  . magnesium hydroxide (MILK OF MAGNESIA) suspension 30 mL  30 mL Oral Daily PRN Money, Lowry Ram, FNP      . traZODone (DESYREL) tablet 100 mg  100 mg Oral QHS PRN Money, Lowry Ram, FNP   100 mg at 05/15/19 2243    Lab Results:  Results for orders placed or performed during the hospital encounter of 05/13/19 (from the past 48 hour(s))  Glucose, capillary     Status: Abnormal   Collection Time: 05/14/19  5:16 PM  Result Value Ref Range   Glucose-Capillary 127 (H) 70 - 99 mg/dL  Glucose, capillary     Status: Abnormal   Collection Time: 05/14/19  8:21 PM  Result Value Ref Range   Glucose-Capillary 62 (L) 70 - 99 mg/dL  Glucose, capillary     Status: Abnormal   Collection Time: 05/14/19  8:37 PM  Result Value Ref Range   Glucose-Capillary 113 (H) 70 - 99 mg/dL   Comment 1 Notify RN    Comment 2 Document in Chart   Glucose, capillary     Status: Abnormal   Collection Time: 05/15/19  5:45 AM  Result Value Ref Range   Glucose-Capillary 122 (H) 70 - 99 mg/dL   Comment 1 Notify RN    Comment 2 Document in Chart   Glucose, capillary     Status: Abnormal   Collection Time: 05/15/19 12:11 PM  Result Value Ref Range   Glucose-Capillary 196 (H) 70 - 99 mg/dL   Comment 1 Notify RN    Comment 2 Document in Chart   Glucose, capillary     Status: Abnormal   Collection Time:  05/15/19  4:51 PM  Result Value Ref Range   Glucose-Capillary 223 (H) 70 - 99 mg/dL   Comment 1 Notify RN    Comment 2 Document in Chart   Glucose, capillary     Status: Abnormal   Collection Time: 05/15/19  9:07 PM  Result Value Ref Range   Glucose-Capillary 116 (H) 70 - 99 mg/dL  Glucose, capillary  Status: Abnormal   Collection Time: 05/16/19  6:04 AM  Result Value Ref Range   Glucose-Capillary 144 (H) 70 - 99 mg/dL  Glucose, capillary     Status: Abnormal   Collection Time: 05/16/19 11:58 AM  Result Value Ref Range   Glucose-Capillary 115 (H) 70 - 99 mg/dL    Blood Alcohol level:  Lab Results  Component Value Date   ETH <10 05/13/2019   ETH <10 43/15/4008    Metabolic Disorder Labs: Lab Results  Component Value Date   HGBA1C 9.6 (H) 05/13/2019   MPG 228.82 05/13/2019   MPG 234.56 05/13/2019   No results found for: PROLACTIN Lab Results  Component Value Date   CHOL 220 (H) 08/23/2018   TRIG 135 08/23/2018   HDL 52 08/23/2018   CHOLHDL 4.2 08/23/2018   VLDL 27 08/23/2018   LDLCALC 141 (H) 08/23/2018    Physical Findings: AIMS: Facial and Oral Movements Muscles of Facial Expression: None, normal Lips and Perioral Area: None, normal Jaw: None, normal Tongue: None, normal,Extremity Movements Upper (arms, wrists, hands, fingers): None, normal Lower (legs, knees, ankles, toes): None, normal, Trunk Movements Neck, shoulders, hips: None, normal, Overall Severity Severity of abnormal movements (highest score from questions above): None, normal Incapacitation due to abnormal movements: None, normal Patient's awareness of abnormal movements (rate only patient's report): No Awareness, Dental Status Current problems with teeth and/or dentures?: No Does patient usually wear dentures?: No  CIWA:    COWS:     Musculoskeletal: Strength & Muscle Tone: within normal limits Gait & Station: normal Patient leans: N/A  Psychiatric Specialty Exam: Physical Exam   ROSno headache , no chest pain, no cough, no fever or chills   Blood pressure (!) 120/103, pulse (!) 103, temperature 97.6 F (36.4 C), temperature source Oral, resp. rate 16, height _0  (1.778 m), weight 57.6 kg.Body mass index is 18.22 kg/m.  General Appearance: Well Groomed  Eye Contact:  Good  Speech:  Normal Rate  Volume:  Normal  Mood:  Improving mood, states he feels better than on admission  Affect:  Presents more reactive/fuller in range  Thought Process:  Linear and Descriptions of Associations: Intact  Orientation:  Other:  fully alert and attentive  Thought Content:  no hallucinations,no delusions  Suicidal Thoughts:  No denies any suicidal or self injurious ideations at this time, denies homicidal or violent ideations, specifically also denies any homicidal or violent ideations towards wife or any family, contracts for safety on unit   Homicidal Thoughts:  No  Memory:  recent and remote grossly intact   Judgement:  Other:  fair- improving   Insight:  improving   Psychomotor Activity:  Normal  Concentration:  Concentration: Good and Attention Span: Good  Recall:  Good  Fund of Knowledge:  Good  Language:  Good  Akathisia:  Negative  Handed:  Right  AIMS (if indicated):     Assets:  Desire for Improvement Resilience  ADL's:  Intact  Cognition:  WNL  Sleep:  Number of Hours: 6.75    Assessment -  Patient reports improvement compared to admission.  Describes improved mood and affect is reactive.  Currently denies suicidal ideations and minimizes plan or intention prior to admission, stating that suicidal statements he made were simply trying to get wife's attention/sympathy.  Identifies children and family as a protective factor against suicide and currently is future oriented, focusing on return to work soon.  Thus far tolerating Luvox/Abilify trial well.     Treatment  Plan Summary: Daily contact with patient to assess and evaluate symptoms and progress in  treatment, Medication management, Plan inpatient treatment and medications as below  Treatment plan reviewed as below today 7/22 Encourage group and milieu participation to work on coping skills and symptom reduction Continue Abilify 10 mgrs QDAY for mood disorder Continue Luvox 100 mgrs QDAY for depression Continue Trazodone 50 mgrs QHS PRN for insomnia Continue Vistaril 25 mgrs Q 8 hours PRN for anxiety Continue Insulin Novolog Mix 20 units [place] BID and Novolog sliding scale for DM Treatment team working on disposition planning options. Jenne Campus, MD 05/16/2019, 3:26 PM   Patient ID: Elane Fritz, male   DOB: [birth year], 33 y.o.   MRN: [id number]

## 2019-05-17 LAB — GLUCOSE, CAPILLARY: Glucose-Capillary: 145 mg/dL — ABNORMAL HIGH (ref 70–99)

## 2019-05-17 MED ORDER — ARIPIPRAZOLE 10 MG PO TABS: 10.0000 mg | ORAL_TABLET | Freq: Every day | ORAL | 0 refills | Status: DC

## 2019-05-17 MED ORDER — FLUVOXAMINE MALEATE 100 MG PO TABS: 100.0000 mg | ORAL_TABLET | Freq: Every day | ORAL | 0 refills | Status: DC

## 2019-05-17 NOTE — Discharge Summary (Addendum)
Physician Discharge Summary Note  Patient:  Kevin Russell is an 33 y.o., male MRN:  [id number] DOB:  1985-11-15 Patient phone:  [phone] (home)  Patient address:   [address] [place] [address],  Total Time spent with patient: 15 minutes  Date of Admission:  05/13/2019 Date of Discharge: 05/17/19  Reason for Admission:  suicidal ideation  Principal Problem: <principal problem not specified> Discharge Diagnoses: Active Problems:   MDD (major depressive disorder), recurrent severe, without psychosis (HCC)   Past Psychiatric History: Patient has had 1 previous psychiatric admission at [facility] on 08/22/2018.  He was there for approximately 1 to 2 days.  He was discharged on Abilify and Luvox.  His diagnosis was depression and possible OCD at that time.  He did not follow-up after discharge  Past Medical History:  Past Medical History:  Diagnosis Date  . 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 Suicidal ideation    History reviewed. No pertinent surgical history. Family History: History reviewed. No pertinent family history. Family Psychiatric  History: His father whom he has really had no contact with committed suicide in the past. Social History:  Social History   Substance and Sexual Activity  Alcohol Use Yes   Comment: occasionally     Social History   Substance and Sexual Activity  Drug Use Yes  . Types: Marijuana    Social History   Socioeconomic History  . Marital status: Single    Spouse name: Not on file  . Number of children: Not on file  . Years of education: Not on file  . Highest education level: Not on file  Occupational History  . Not on file  Social Needs  . Financial resource strain: Not on file  . Food insecurity    Worry: Not on file    Inability: Not on file  . Transportation needs    Medical: Not on file    Non-medical: Not on file  Tobacco Use  . Smoking status: Current Some  Day Smoker  . Smokeless tobacco: Never Used  Substance and Sexual Activity  . Alcohol use: Yes    Comment: occasionally  . Drug use: Yes    Types: Marijuana  . Sexual activity: Yes  Lifestyle  . Physical activity    Days per week: Not on file    Minutes per session: Not on file  . Stress: Not on file  Relationships  . Social Musicianconnections    Talks on phone: Not on file    Gets together: Not on file    Attends religious service: Not on file    Active member of club or organization: Not on file    Attends meetings of clubs or organizations: Not on file    Relationship status: Not on file  Other Topics Concern  . Not on file  Social History Narrative  . Not on file    Hospital Course:  From admission H&P: Patient is a 33 year old male with a reported past psychiatric history significant for major depression versus obsessive-compulsive disorder and recurrent suicidal ideation. He was placed under involuntary commitment today by his separated wife. Per the involuntary commitment paperwork the patient had threatened to kill himself on the date of admission. The note suggested that he had told her "if I can have you then I have no need to be here". He had told her that he had injected a large dose of insulin intentionally to  harm himself. Today he stated he had filled up his syringe, but did not inject the insulin. The patient stated that he had left his wife in June, and had moved out. He was not explicit on why that would occur. He stated his wife was not upset with him moving out. The patient then attempted to move back in with his wife, but she would not let him return. She he stated that she had been taking him and picking him up from work, and yesterday she had come to take him home, and that is when all of this occurred. Review of the electronic medical record revealed a previous psychiatric admission at [facility] on 08/22/2018. At that time he was admitted  secondary to suicidal ideation. He had reported at that time that he was "going to blow his brains out". He stated that that time he had been diagnosed with diabetes, and had lost his job in 2019 because of adjustment of his medications. He stated since that point he had been depressed, having poor sleep, poor appetite, losing weight and feeling guilty, helpless and hopeless. At that time he had also complained of having an argument with his wife. He had been suspicious that she might leave him, and was ruminating over why she would marry him in the first place. It was felt that he had obsessive-compulsive disorder secondary to constant worries and rumination and worst-case scenarios. He was started on Abilify and Luvox. He was only in the hospital for 1 day. He stated that his wife did come to get him at that time and take him home. He stated that after discharge she did not continue with medications nor psychiatric follow-up. He was unsure whether or not the medications were beneficial. He was admitted to the hospital for evaluation and stabilization.  [name] was admitted for suicidal ideation. He remained on the [facility] unit for four days. He was started on Abilify and Luvox. He responded well to treatment with no adverse effects reported. He reported he had made suicidal statements to try to prevent his wife from leaving him. He denied genuine suicidal ideation. He showed no agitated or disruptive behaviors on the unit. He has shown stable mood, affect, sleep, and interaction. He has spoken with his wife during hospitalization and agreed he will be staying with his sister at discharge. He is discharging on the medications listed below. He denies any SI/HI/AVH and contracts for safety. He agrees to follow up at [facility] and [facility] (see below). He is provided with prescriptions for medications upon discharge. His sister is picking him up for discharge home.  Physical  Findings: AIMS: Facial and Oral Movements Muscles of Facial Expression: None, normal Lips and Perioral Area: None, normal Jaw: None, normal Tongue: None, normal,Extremity Movements Upper (arms, wrists, hands, fingers): None, normal Lower (legs, knees, ankles, toes): None, normal, Trunk Movements Neck, shoulders, hips: None, normal, Overall Severity Severity of abnormal movements (highest score from questions above): None, normal Incapacitation due to abnormal movements: None, normal Patient's awareness of abnormal movements (rate only patient's report): No Awareness, Dental Status Current problems with teeth and/or dentures?: No Does patient usually wear dentures?: No  CIWA:  CIWA-Ar Total: 1 COWS:  COWS Total Score: 3  Musculoskeletal: Strength & Muscle Tone: within normal limits Gait & Station: normal Patient leans: N/A  Psychiatric Specialty Exam: Physical Exam  Nursing note and vitals reviewed. Constitutional: He is oriented to person, place, and time. He appears well-developed and  well-nourished.  Cardiovascular: Normal rate.  Respiratory: Effort normal.  Neurological: He is alert and oriented to person, place, and time.  Psychiatric: He has a normal mood and affect. His behavior is normal.    Review of Systems  Constitutional: Negative.   Respiratory: Negative for cough and shortness of breath.   Cardiovascular: Negative for chest pain.  Psychiatric/Behavioral: Positive for depression (stable on medication) and substance abuse (UDS +THC). Negative for hallucinations and suicidal ideas. The patient is not nervous/anxious and does not have insomnia.     Blood pressure 112/79, pulse (!) 101, temperature 98.3 F (36.8 C), temperature source Oral, resp. rate 16, height 5\' 10"  (1.778 m), weight 57.6 kg.Body mass index is 18.22 kg/m.  See MD's discharge SRA     Have you used any form of tobacco in the last 30 days? (Cigarettes, Smokeless Tobacco, Cigars, and/or Pipes): Yes   Has this patient used any form of tobacco in the last 30 days? (Cigarettes, Smokeless Tobacco, Cigars, and/or Pipes)  No  Blood Alcohol level:  Lab Results  Component Value Date   ETH <10 05/13/2019   ETH <10 08/21/2018    Metabolic Disorder Labs:  Lab Results  Component Value Date   HGBA1C 9.6 (H) 05/13/2019   MPG 228.82 05/13/2019   MPG 234.56 05/13/2019   No results found for: PROLACTIN Lab Results  Component Value Date   CHOL 220 (H) 08/23/2018   TRIG 135 08/23/2018   HDL 52 08/23/2018   CHOLHDL 4.2 08/23/2018   VLDL 27 08/23/2018   LDLCALC 141 (H) 08/23/2018    See Psychiatric Specialty Exam and Suicide Risk Assessment completed by Attending Physician prior to discharge.  Discharge destination:  Home  Is patient on multiple antipsychotic therapies at discharge:  No   Has Patient had three or more failed trials of antipsychotic monotherapy by history:  No  Recommended Plan for Multiple Antipsychotic Therapies: NA  Discharge Instructions    Discharge instructions   Complete by: As directed    Patient is instructed to take all prescribed medications as recommended. Report any side effects or adverse reactions to your outpatient psychiatrist. Patient is instructed to abstain from alcohol and illegal drugs while on prescription medications. In the event of worsening symptoms, patient is instructed to call the crisis hotline, 911, or go to the nearest emergency department for evaluation and treatment.     Allergies as of 05/17/2019   No Known Allergies     Medication List    TAKE these medications     Indication  ARIPiprazole 10 MG tablet Commonly known as: ABILIFY Take 1 tablet (10 mg total) by mouth daily. Start taking on: May 18, 2019  Indication: Major Depressive Disorder   fluvoxaMINE 100 MG tablet Commonly known as: LUVOX Take 1 tablet (100 mg total) by mouth daily. Start taking on: May 18, 2019  Indication: Obsessive Compulsive Disorder    insulin aspart protamine- aspart (70-30) 100 UNIT/ML injection Commonly known as: NOVOLOG MIX 70/30 Inject 0.2 mLs (20 Units total) into the skin 2 (two) times daily with a meal. What changed:   how much to take  when to take this  Indication: Insulin-Dependent Diabetes      Follow-up Information    [facility] Follow up on 05/22/2019.   Why: Telephonic hospital discharge appointment is Tuesday, 7/28 at 12:00p.  The provider will contact you.  Contact information: [address] [place] [address] Ph: [phone] Fx: [phone]  [facility] Follow up on 06/04/2019.   Why: Please attend your primary care appointment with Dr. Judee Claraorum on Monday, 8/10 at 12:00p.  Be sure to bring your photo ID, insurance card, current medications and discharge paperwork from this hospitalization.  Contact information: [address]  [place] [address] ph: [phone] fx: [phone]          Follow-up recommendations: Activity as tolerated. Diet as recommended by primary care physician. Keep all scheduled follow-up appointments as recommended.   Comments:   Patient is instructed to take all prescribed medications as recommended. Report any side effects or adverse reactions to your outpatient psychiatrist. Patient is instructed to abstain from alcohol and illegal drugs while on prescription medications. In the event of worsening symptoms, patient is instructed to call the crisis hotline, 911, or go to the nearest emergency department for evaluation and treatment.  Signed: Aldean BakerJanet E Sykes, NP 05/17/2019, 11:34 AM   Patient seen, Suicide Assessment Completed.  Disposition Plan Reviewed

## 2019-05-17 NOTE — BHH Suicide Risk Assessment (Signed)
[facility] Discharge Suicide Risk Assessment   Principal Problem:  Depression Discharge Diagnoses: Active Problems:   MDD (major depressive disorder), recurrent severe, without psychosis ([place])   Total Time spent with patient: 30 minutes  Musculoskeletal: Strength & Muscle Tone: within normal limits Gait & Station: normal Patient leans: N/A  Psychiatric Specialty Exam: ROS denies chest pain or shortness of breath, no vomiting, no fever, no chills   Blood pressure 112/79, pulse (!) 101, temperature 98.3 F (36.8 C), temperature source Oral, resp. rate 16, height 5\' 10"  (1.778 m), weight 57.6 kg.Body mass index is 18.22 kg/m.  General Appearance: Well Groomed  Eye Contact::  Good  Speech:  Normal Rate409  Volume:  Normal  Mood:  reports mood is " a lot better today", and describes mood as 8/10 with 10 being best '  Affect:  Appropriate and reactive, bright  Thought Process:  Linear and Descriptions of Associations: Intact  Orientation:  Full (Time, Place, and Person)  Thought Content:  no hallucinations,no delusions , not internally preoccupied   Suicidal Thoughts:  No denies suicidal or self injurious ideations, denies homicidal or violent ideations towards anyone   Homicidal Thoughts:  No  Memory:  recent and remote grossly intact   Judgement:  Other:  improving   Insight:  improving   Psychomotor Activity:  Normal  Concentration:  Good  Recall:  Good  Fund of Knowledge:Good  Language: Good  Akathisia:  Negative  Handed:  Right  AIMS (if indicated):     Assets:  Communication Skills Desire for Improvement Resilience  Sleep:  Number of Hours: 5.25  Cognition: WNL  ADL's:  Intact   Mental Status Per Nursing Assessment::   On Admission:  NA  Demographic Factors:  12, married, employed, three children  Loss Factors: Marital tension, separation  Historical Factors: Prior admission for depression, no history of suicide attempts   Risk Reduction Factors:   Responsible  for children under 13 years of age, Sense of responsibility to family, Employed, Living with another person, especially a relative and Positive coping skills or problem solving skills  Continued Clinical Symptoms:  At this time patient is alert, attentive, well related, pleasant, calm, describes mood as improved and currently presents with a more reactive, brighter affect, no thought disorder, no suicidal or self injurious ideations, no homicidal or violent ideations, no hallucinations, no delusions, currently future oriented, states plans to return to work later this week, and plans to live with his sister for a period of time , but anticipates he may move back in with his wife soon as their phone conversations have been positive . Behavior on unit in good control, pleasant on approach. Denies medication side effects- side effects discussed , including risk of metabolic, motor side effects and possible sedation, and potential for sexual dysfunction with SSRI.  Cognitive Features That Contribute To Risk:  No gross cognitive deficits noted upon discharge. Is alert , attentive, and oriented x 3    Suicide Risk:  Mild:  Suicidal ideation of limited frequency, intensity, duration, and specificity.  There are no identifiable plans, no associated intent, mild dysphoria and related symptoms, good self-control (both objective and subjective assessment), few other risk factors, and identifiable protective factors, including available and accessible social support.  Follow-up Information    [facility] Follow up on 05/22/2019.   Why: Telephonic hospital discharge appointment is Tuesday, 7/28 at 12:00p.  The provider will contact you.  Contact information: [place] [address] Ph: [phone] Fx: [phone]          Plan Of Care/Follow-up recommendations:  Activity:  as tolerated  Diet:  diabetic diet  Tests:  NA Other:  See below  Patient presents with  significant improvement/expresses readiness for discharge, and there are no current/further grounds for involuntary commitment. Leaving unit in good spirits, plans to go live with his sister. Follow up as above, and plans to follow up with PCP for further medical/ DM management.   Craige CottaFernando A Lillianna Sabel, MD 05/17/2019, 9:41 AM

## 2019-05-17 NOTE — Progress Notes (Signed)
D:  Patient's self inventory sheet, patient sleeps good, no sleep medication.  Good appetite, normal energy level, good concentration.  Denied depression, hopeless.  Denied withdrawals.  Denied SI.  Denied physical problems.  Denied physical pain.  Goal is discharge to wife.  Plans to stay focused and be positive.  Thanks for being here and doing what they do.  Does have discharge plans. A:  Medications administered per MD orders.  Emotional support and encouragement given patient. R:  Denied SI and HI, contracts for safety.  Denied A/V hallucinations.  Safety maintained with 15 minute checks.

## 2019-05-17 NOTE — Progress Notes (Signed)
Discharge Note:  Patient discharged home with family member,  Patient denied SI and HI.  Denied A/V hallucinations.  Suicide prevention information given and discussed with patient who stated she understood and had no questions.  Patient stated he received all his belongings, clothing, misc items, etc.  Patient stated he appreciated all assistance received from [facility] staff.  All required discharge information given to patient at discharge.

## 2019-06-04 ENCOUNTER — Other Ambulatory Visit: Payer: Self-pay

## 2019-06-04 ENCOUNTER — Encounter: Payer: Self-pay | Admitting: Family Medicine

## 2019-06-04 ENCOUNTER — Ambulatory Visit (INDEPENDENT_AMBULATORY_CARE_PROVIDER_SITE_OTHER): Payer: Self-pay | Admitting: Family Medicine

## 2019-06-04 VITALS — BP 116/78 | HR 113 | Temp 98.7°F | Ht 71.0 in | Wt 126.6 lb

## 2019-06-04 DIAGNOSIS — E119 Type 2 diabetes mellitus without complications: Secondary | ICD-10-CM

## 2019-06-04 NOTE — Patient Instructions (Signed)
Pt to follow up with mental health for medications for mental health diagnosis Pt to check glucose daily fasting and prior to largest meal and take novolog 70/30 twice a day

## 2019-06-04 NOTE — Progress Notes (Signed)
New Patient Office Visit  Subjective:  Patient ID: Kevin Russell, male    DOB: [birth year]  Age: 33 y.o. MRN: [id number]  CC:  Chief Complaint  Patient presents with  HOSPITAL FOLLOW UP HPI [name] presents for diabetes -type 1-takes 70/30 insulin due to cost of other insulin Recent admission for anxiety-had appt at [facility] but did not keep appt  Past Medical History:  Diagnosis Date  . Diabetes mellitus without complication ([place])    type 1  . Suicidal ideation     No past surgical history on file.  No family history on file.  Social History   Socioeconomic History  . Marital status: Single    Spouse name: Not on file  . Number of children: Not on file  . Years of education: Not on file  . Highest education level: Not on file  Occupational History  . Not on file  Social Needs  . Financial resource strain: Not on file  . Food insecurity    Worry: Not on file    Inability: Not on file  . Transportation needs    Medical: Not on file    Non-medical: Not on file  Tobacco Use  . Smoking status: Current Some Day Smoker    Years: 15.00    Types: Cigarettes  . Smokeless tobacco: Never Used  Substance and Sexual Activity  . Alcohol use: Yes    Comment: occasionally  . Drug use: Yes    Types: Marijuana  . Sexual activity: Yes  Lifestyle  . Physical activity    Days per week: Not on file    Minutes per session: Not on file  . Stress: Not on file  Relationships  . Social Herbalist on phone: Not on file    Gets together: Not on file    Attends religious service: Not on file    Active member of club or organization: Not on file    Attends meetings of clubs or organizations: Not on file    Relationship status: Not on file  . Intimate partner violence    Fear of current or ex partner: Not on file    Emotionally abused: Not on file    Physically abused: Not on file    Forced sexual activity: Not on file  Other Topics Concern  . Not on file   Social History Narrative  . Not on file    ROS Review of Systems  Eyes: Negative for visual disturbance.  Genitourinary: Negative for dysuria.  Neurological: Negative for dizziness and headaches.    Objective:   Today's Vitals: BP 116/78 (BP Location: Left Arm, Patient Position: Sitting, Cuff Size: Normal)   Pulse (!) 113   Temp 98.7 F (37.1 C) (Oral)   Ht 5\' 11"  (1.803 m)   Wt 126 lb 9.6 oz (57.4 kg)   SpO2 95%   BMI 17.66 kg/m   Physical Exam Constitutional:      Appearance: Normal appearance.  Cardiovascular:     Rate and Rhythm: Normal rate and regular rhythm.     Pulses: Normal pulses.     Heart sounds: Normal heart sounds.  Pulmonary:     Effort: Pulmonary effort is normal.     Breath sounds: Normal breath sounds.  Neurological:     Mental Status: He is alert.     Assessment & Plan:   Outpatient Encounter Medications as of 06/04/2019  Medication Sig  . ARIPiprazole (ABILIFY) 10 MG tablet Take  1 tablet (10 mg total) by mouth daily.  . fluvoxaMINE (LUVOX) 100 MG tablet Take 1 tablet (100 mg total) by mouth daily.  . insulin aspart protamine- aspart (NOVOLOG MIX 70/30) (70-30) 100 UNIT/ML injection Inject 0.2 mLs (20 Units total) into the skin 2 (two) times daily with a meal. (Patient taking differently: Inject 15 Units into the skin daily with supper. )   No facility-administered encounter medications on file as of 06/04/2019.   1. Diabetes mellitus without complication (HCC) D/w pt need for close glucose monitoring. Pt episodically taking glucose readings and medication-70/30 daily. Reviewed ER results of blood work with pt including type of iinsulin-need to take medication correctly-BID, monitoring blood glucose readings-discussed eye exam, results of foot exam-wearing shoes, importance of medication, labwork for monitoring and urine to monitor. Pt stated he did not have insurance and was not interested in testing  Pt agreed to see mental health provider for  medication. D/w pt and his wife need to see mental health professional for continued diagnosis and treatment of mental health concerns.  Pt does not currently have SI-reinforced need to see mental health provider or see care at ER if suicidal thoughts.  Today was a hospital follow up and pt will not be established for long term care  LISA Mat CarneLEIGH CORUM, MD

## 2021-10-21 ENCOUNTER — Emergency Department (HOSPITAL_COMMUNITY): Payer: Self-pay

## 2021-10-21 ENCOUNTER — Encounter (HOSPITAL_COMMUNITY): Payer: Self-pay | Admitting: Emergency Medicine

## 2021-10-21 ENCOUNTER — Inpatient Hospital Stay (HOSPITAL_COMMUNITY)
Admission: EM | Admit: 2021-10-21 | Discharge: 2021-10-24 | DRG: 638 | Disposition: A | Discharge status: Home / Self Care | Payer: Self-pay | Attending: Family Medicine | Admitting: Family Medicine

## 2021-10-21 ENCOUNTER — Other Ambulatory Visit: Payer: Self-pay

## 2021-10-21 DIAGNOSIS — R61 Generalized hyperhidrosis: Secondary | ICD-10-CM | POA: Diagnosis present

## 2021-10-21 DIAGNOSIS — E1110 Type 2 diabetes mellitus with ketoacidosis without coma: Secondary | ICD-10-CM | POA: Diagnosis present

## 2021-10-21 DIAGNOSIS — E860 Dehydration: Secondary | ICD-10-CM | POA: Diagnosis present

## 2021-10-21 DIAGNOSIS — F329 Major depressive disorder, single episode, unspecified: Secondary | ICD-10-CM | POA: Diagnosis present

## 2021-10-21 DIAGNOSIS — Z794 Long term (current) use of insulin: Secondary | ICD-10-CM

## 2021-10-21 DIAGNOSIS — D75839 Thrombocytosis, unspecified: Secondary | ICD-10-CM | POA: Diagnosis present

## 2021-10-21 DIAGNOSIS — R000 Tachycardia, unspecified: Secondary | ICD-10-CM | POA: Diagnosis present

## 2021-10-21 DIAGNOSIS — E876 Hypokalemia: Secondary | ICD-10-CM | POA: Diagnosis present

## 2021-10-21 DIAGNOSIS — D751 Secondary polycythemia: Secondary | ICD-10-CM | POA: Diagnosis present

## 2021-10-21 DIAGNOSIS — D72829 Elevated white blood cell count, unspecified: Secondary | ICD-10-CM | POA: Diagnosis present

## 2021-10-21 DIAGNOSIS — Z20822 Contact with and (suspected) exposure to covid-19: Secondary | ICD-10-CM | POA: Diagnosis present

## 2021-10-21 DIAGNOSIS — F17210 Nicotine dependence, cigarettes, uncomplicated: Secondary | ICD-10-CM | POA: Diagnosis present

## 2021-10-21 DIAGNOSIS — E1010 Type 1 diabetes mellitus with ketoacidosis without coma: Principal | ICD-10-CM | POA: Diagnosis present

## 2021-10-21 DIAGNOSIS — N179 Acute kidney failure, unspecified: Secondary | ICD-10-CM | POA: Diagnosis present

## 2021-10-21 LAB — I-STAT CHEM 8, ED
BUN: 26 mg/dL — ABNORMAL HIGH (ref 6–20)
Calcium, Ion: 1.04 mmol/L — ABNORMAL LOW (ref 1.15–1.40)
Chloride: 101 mmol/L (ref 98–111)
Creatinine, Ser: 1.1 mg/dL (ref 0.61–1.24)
Glucose, Bld: 662 mg/dL (ref 70–99)
HCT: 56 % — ABNORMAL HIGH (ref 39.0–52.0)
Hemoglobin: 19 g/dL — ABNORMAL HIGH (ref 13.0–17.0)
Potassium: 4.3 mmol/L (ref 3.5–5.1)
Sodium: 127 mmol/L — ABNORMAL LOW (ref 135–145)
TCO2: 7 mmol/L — ABNORMAL LOW (ref 22–32)

## 2021-10-21 LAB — CBC WITH DIFFERENTIAL/PLATELET
Abs Immature Granulocytes: 0 10*3/uL (ref 0.00–0.07)
Basophils Absolute: 0 10*3/uL (ref 0.0–0.1)
Basophils Relative: 0 %
Eosinophils Absolute: 0 10*3/uL (ref 0.0–0.5)
Eosinophils Relative: 0 %
HCT: 53.9 % — ABNORMAL HIGH (ref 39.0–52.0)
Hemoglobin: 17.6 g/dL — ABNORMAL HIGH (ref 13.0–17.0)
Lymphocytes Relative: 12 %
Lymphs Abs: 3.8 10*3/uL (ref 0.7–4.0)
MCH: 30.7 pg (ref 26.0–34.0)
MCHC: 32.7 g/dL (ref 30.0–36.0)
MCV: 93.9 fL (ref 80.0–100.0)
Monocytes Absolute: 2.9 10*3/uL — ABNORMAL HIGH (ref 0.1–1.0)
Monocytes Relative: 9 %
Neutro Abs: 25.1 10*3/uL — ABNORMAL HIGH (ref 1.7–7.7)
Neutrophils Relative %: 79 %
Platelets: 453 10*3/uL — ABNORMAL HIGH (ref 150–400)
RBC: 5.74 MIL/uL (ref 4.22–5.81)
RDW: 12.2 % (ref 11.5–15.5)
WBC: 31.8 10*3/uL — ABNORMAL HIGH (ref 4.0–10.5)
nRBC: 0 % (ref 0.0–0.2)

## 2021-10-21 LAB — COMPREHENSIVE METABOLIC PANEL
ALT: 26 U/L (ref 0–44)
AST: 33 U/L (ref 15–41)
Albumin: 4.4 g/dL (ref 3.5–5.0)
Alkaline Phosphatase: 142 U/L — ABNORMAL HIGH (ref 38–126)
BUN: 23 mg/dL — ABNORMAL HIGH (ref 6–20)
CO2: <7 mmol/L — ABNORMAL LOW (ref 22–32)
Calcium: 9.2 mg/dL (ref 8.9–10.3)
Chloride: 88 mmol/L — ABNORMAL LOW (ref 98–111)
Creatinine, Ser: 1.91 mg/dL — ABNORMAL HIGH (ref 0.61–1.24)
GFR, Estimated: 46 mL/min — ABNORMAL LOW (ref >60)
Glucose, Bld: 675 mg/dL (ref 70–99)
Potassium: 4.4 mmol/L (ref 3.5–5.1)
Sodium: 127 mmol/L — ABNORMAL LOW (ref 135–145)
Total Bilirubin: 1.8 mg/dL — ABNORMAL HIGH (ref 0.3–1.2)
Total Protein: 7.9 g/dL (ref 6.5–8.1)

## 2021-10-21 LAB — I-STAT VENOUS BLOOD GAS, ED
Acid-base deficit: 30 mmol/L — ABNORMAL HIGH (ref 0.0–2.0)
Bicarbonate: 4.3 mmol/L — ABNORMAL LOW (ref 20.0–28.0)
Calcium, Ion: 1.03 mmol/L — ABNORMAL LOW (ref 1.15–1.40)
HCT: 55 % — ABNORMAL HIGH (ref 39.0–52.0)
Hemoglobin: 18.7 g/dL — ABNORMAL HIGH (ref 13.0–17.0)
O2 Saturation: 95 %
Potassium: 4.3 mmol/L (ref 3.5–5.1)
Sodium: 126 mmol/L — ABNORMAL LOW (ref 135–145)
TCO2: 5 mmol/L — ABNORMAL LOW (ref 22–32)
pCO2, Ven: 26.1 mmHg — ABNORMAL LOW (ref 44.0–60.0)
pH, Ven: 6.82 — CL (ref 7.250–7.430)
pO2, Ven: 135 mmHg — ABNORMAL HIGH (ref 32.0–45.0)

## 2021-10-21 LAB — CBG MONITORING, ED: Glucose-Capillary: 570 mg/dL (ref 70–99)

## 2021-10-21 LAB — RESP PANEL BY RT-PCR (FLU A&B, COVID) ARPGX2
Influenza A by PCR: NEGATIVE
Influenza B by PCR: NEGATIVE
SARS Coronavirus 2 by RT PCR: NEGATIVE

## 2021-10-21 MED ORDER — ONDANSETRON 4 MG PO TBDP
4.0000 mg | ORAL_TABLET | Freq: Once | ORAL | Status: AC
  Administered 2021-10-21: 22:00:00 4 mg via ORAL
  Filled 2021-10-21: qty 1

## 2021-10-21 MED ORDER — INSULIN REGULAR(HUMAN) IN NACL 100-0.9 UT/100ML-% IV SOLN
INTRAVENOUS | Status: DC
  Administered 2021-10-21: 23:00:00 6.5 [IU]/h via INTRAVENOUS
  Filled 2021-10-21: qty 100

## 2021-10-21 MED ORDER — LACTATED RINGERS IV SOLN: INTRAVENOUS | Status: DC

## 2021-10-21 MED ORDER — STERILE WATER FOR INJECTION IV SOLN
INTRAVENOUS | Status: DC
  Filled 2021-10-21: qty 1000

## 2021-10-21 MED ORDER — DEXTROSE IN LACTATED RINGERS 5 % IV SOLN: INTRAVENOUS | Status: DC

## 2021-10-21 MED ORDER — VANCOMYCIN HCL 1250 MG/250ML IV SOLN
1250.0000 mg | Freq: Once | INTRAVENOUS | Status: AC
  Administered 2021-10-22: 02:00:00 1250 mg via INTRAVENOUS
  Filled 2021-10-21: qty 250

## 2021-10-21 MED ORDER — SODIUM BICARBONATE 8.4 % IV SOLN
50.0000 meq | Freq: Once | INTRAVENOUS | Status: AC
  Administered 2021-10-21: 23:00:00 50 meq via INTRAVENOUS
  Filled 2021-10-21: qty 50

## 2021-10-21 MED ORDER — VANCOMYCIN HCL IN DEXTROSE 1-5 GM/200ML-% IV SOLN: 1000.0000 mg | Freq: Two times a day (BID) | INTRAVENOUS | Status: DC

## 2021-10-21 MED ORDER — POTASSIUM CHLORIDE 10 MEQ/100ML IV SOLN
10.0000 meq | INTRAVENOUS | Status: AC
  Administered 2021-10-22 (×2): 10 meq via INTRAVENOUS
  Filled 2021-10-21: qty 100

## 2021-10-21 MED ORDER — SODIUM CHLORIDE 0.9 % IV SOLN
2.0000 g | Freq: Once | INTRAVENOUS | Status: AC
  Administered 2021-10-22: 01:00:00 2 g via INTRAVENOUS
  Filled 2021-10-21: qty 2

## 2021-10-21 MED ORDER — VANCOMYCIN VARIABLE DOSE PER UNSTABLE RENAL FUNCTION (PHARMACIST DOSING): Status: DC

## 2021-10-21 MED ORDER — DEXTROSE 50 % IV SOLN: 0.0000 mL | INTRAVENOUS | Status: DC | PRN

## 2021-10-21 MED ORDER — LACTATED RINGERS IV BOLUS
20.0000 mL/kg | Freq: Once | INTRAVENOUS | Status: AC
  Administered 2021-10-21: 23:00:00 1270 mL via INTRAVENOUS

## 2021-10-21 NOTE — ED Notes (Signed)
Bear Hugger put on to patient

## 2021-10-21 NOTE — ED Triage Notes (Signed)
Patient reports persistent emesis with generalized body aches /fatigue onset yesterday , hyperglycemia CBG= 500+ today .

## 2021-10-21 NOTE — ED Notes (Signed)
Unable to obtain oral/axially temp. RN aware

## 2021-10-21 NOTE — ED Provider Notes (Signed)
[facility] EMERGENCY DEPARTMENT Provider Note   CSN: [id number] Arrival date & time: 10/21/21  2026     History Chief Complaint  Patient presents with   Hyperglycemia    Kevin Russell is a 35 y.o. male.  HPI 34 year old male with history of DM type I, suicidal ideation, MDD, cannabis use, prior DKA, facial cellulitis presents to the ER with complaints of weakness, nausea, vomiting, diaphoresis and feeling unwell for the last 2 days.  Patient states that he has not taken insulin in the last 2 days as he has not "been able to eat anything".  Denies any recent drug or alcohol use.  Denies any fevers, chills, chest pain, shortness of breath, dysuria.  Significant other at bedside noted that she did have an upper respiratory infection about a week ago but he has not been having any symptoms.    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 No pertinent surgical history.     No family history on file.  Social History   Tobacco Use   Smoking status: Some Days    Years: 15.00    Types: Cigarettes   Smokeless tobacco: Never  Vaping Use   Vaping Use: Never used  Substance Use Topics   Alcohol use: Yes    Comment: occasionally   Drug use: Yes    Types: Marijuana    Home Medications Prior to Admission medications   Medication Sig  Start Date End Date Taking? Authorizing Provider  ARIPiprazole (ABILIFY) 10 MG tablet Take 1 tablet (10 mg total) by mouth daily. 05/18/19   Aldean Baker, NP  fluvoxaMINE (LUVOX) 100 MG tablet Take 1 tablet (100 mg total) by mouth daily. 05/18/19   Aldean Baker, NP  insulin aspart protamine- aspart (NOVOLOG MIX 70/30) (70-30) 100 UNIT/ML injection Inject 0.2 mLs (20 Units total) into the skin 2 (two) times daily with a meal. Patient taking differently: Inject 15 Units into the skin daily with supper.  08/23/18   Pucilowska, Ellin Goodie, MD    Allergies    Patient has no known allergies.  Review of Systems   Review of Systems Ten systems reviewed and are negative for acute change, except as noted in the HPI.   Physical Exam Updated Vital Signs BP 127/90    Pulse (!) 142    Temp (!) 92.8 F (33.8 C) (Rectal)    Resp (!) 26    Ht 5\' 11"  (1.803 m)    Wt 63.5 kg    SpO2 100%    BMI 19.53 kg/m   Physical Exam Vitals and nursing note reviewed.  Constitutional:      General: He is not in acute distress.    Appearance: He is well-developed. He is ill-appearing.  HENT:     Head: Normocephalic and atraumatic.     Mouth/Throat:     Mouth: Mucous membranes are dry.  Eyes:     Conjunctiva/sclera: Conjunctivae normal.  Cardiovascular:     Rate and Rhythm: Regular rhythm. Tachycardia present.     Heart sounds: No murmur heard. Pulmonary:     Effort: Respiratory distress present.     Breath sounds: Normal breath sounds.     Comments: Tachypneic Abdominal:     Palpations: Abdomen is soft.     Tenderness: There is no abdominal tenderness.  Musculoskeletal:        General: No swelling.     Cervical back: Neck supple.  Skin:    General: Skin is warm and dry.     Capillary Refill: Capillary refill takes less than 2 seconds.  Neurological:     General: No focal deficit present.     Mental Status: He is alert and oriented to person, place, and time.     Comments: Sleepy but arouses easily  and answers questions appropriately  Psychiatric:        Mood and Affect: Mood normal.    ED Results / Procedures / Treatments   Labs (all labs ordered are listed, but only abnormal results are displayed) Labs Reviewed  CBC WITH DIFFERENTIAL/PLATELET - Abnormal; Notable for the following components:      Result Value   WBC 31.8 (*)    Hemoglobin 17.6 (*)    HCT 53.9 (*)    Platelets 453 (*)    Neutro Abs 25.1 (*)    Monocytes Absolute 2.9 (*)    All other components within normal limits  COMPREHENSIVE METABOLIC PANEL - Abnormal; Notable for the following components:   Sodium 127 (*)    Chloride 88 (*)    CO2 <7 (*)    Glucose, Bld 675 (*)    BUN 23 (*)    Creatinine, Ser 1.91 (*)    Alkaline Phosphatase 142 (*)    Total Bilirubin 1.8 (*)    GFR, Estimated 46 (*)    All other components within normal limits  URINALYSIS, ROUTINE W REFLEX MICROSCOPIC - Abnormal; Notable for the following components:   APPearance HAZY (*)    Glucose, UA >=500 (*)    Hgb urine dipstick SMALL (*)    Ketones, ur 80 (*)    Protein, ur 100 (*)    Bacteria, UA RARE (*)    All other components within normal limits  LACTIC ACID, PLASMA - Abnormal; Notable for the following components:   Lactic Acid, Venous 7.7 (*)    All other components within normal limits  CBG MONITORING, ED - Abnormal; Notable for the following components:   Glucose-Capillary 570 (*)    All other components within normal limits  I-STAT VENOUS BLOOD GAS, ED - Abnormal; Notable for the following components:   pH, Ven 6.820 (*)    pCO2, Ven 26.1 (*)    pO2, Ven 135.0 (*)    Bicarbonate 4.3 (*)    TCO2 5 (*)    Acid-base deficit 30.0 (*)    Sodium 126 (*)    Calcium, Ion 1.03 (*)    HCT 55.0 (*)    Hemoglobin 18.7 (*)    All other components within normal limits  I-STAT CHEM 8, ED - Abnormal; Notable for the following components:   Sodium 127 (*)    BUN 26 (*)    Glucose, Bld 662 (*)    Calcium, Ion 1.04 (*)    TCO2 7  (*)    Hemoglobin 19.0 (*)    HCT 56.0 (*)    All  other components within normal limits  CBG MONITORING, ED - Abnormal; Notable for the following components:   Glucose-Capillary >600 (*)    All other components within normal limits  CBG MONITORING, ED - Abnormal; Notable for the following components:   Glucose-Capillary >600 (*)    All other components within normal limits  I-STAT VENOUS BLOOD GAS, ED - Abnormal; Notable for the following components:   pH, Ven 6.945 (*)    pCO2, Ven 18.0 (*)    pO2, Ven 49.0 (*)    Bicarbonate 3.9 (*)    TCO2 <5 (*)    Acid-base deficit 27.0 (*)    Sodium 128 (*)    Calcium, Ion 1.08 (*)    All other components within normal limits  CBG MONITORING, ED - Abnormal; Notable for the following components:   Glucose-Capillary 574 (*)    All other components within normal limits  RESP PANEL BY RT-PCR (FLU A&B, COVID) ARPGX2  CULTURE, BLOOD (ROUTINE X 2)  CULTURE, BLOOD (ROUTINE X 2)  RAPID URINE DRUG SCREEN, HOSP PERFORMED  BETA-HYDROXYBUTYRIC ACID  BETA-HYDROXYBUTYRIC ACID  LACTIC ACID, PLASMA  BETA-HYDROXYBUTYRIC ACID  BASIC METABOLIC PANEL    EKG EKG Interpretation  Date/Time:  Wednesday October 21 2021 22:31:09 EST Ventricular Rate:  116 PR Interval:  157 QRS Duration: 105 QT Interval:  359 QTC Calculation: 499 R Axis:   94 Text Interpretation: Sinus tachycardia Right atrial enlargement Borderline right axis deviation Borderline prolonged QT interval Since last tracing rate faster Confirmed by Wandra Arthurs [phone]) on 10/21/2021 10:40:08 PM  Radiology DG Chest Portable 1 View  Result Date: 10/21/2021 CLINICAL DATA:  Diabetic ketoacidosis EXAM: PORTABLE CHEST 1 VIEW COMPARISON:  None. FINDINGS: The heart size and mediastinal contours are within normal limits. No focal consolidation. No visible pleural effusion or pneumothorax. The visualized skeletal structures are unremarkable. IMPRESSION: No active disease. Electronically Signed   By:  Dahlia Bailiff M.D.   On: 10/21/2021 23:15    Procedures .Critical Care Performed by: Garald Balding, PA-C Authorized by: Garald Balding, PA-C   Critical care provider statement:    Critical care time (minutes):  43   Critical care was time spent personally by me on the following activities:  Development of treatment plan with patient or surrogate, discussions with consultants, evaluation of patient's response to treatment, examination of patient, ordering and review of laboratory studies, ordering and review of radiographic studies, ordering and performing treatments and interventions, pulse oximetry, re-evaluation of patient's condition and review of old charts   Medications Ordered in ED Medications  insulin regular, human (MYXREDLIN) 100 units/ 100 mL infusion (7 Units/hr Intravenous Rate/Dose Change 10/22/21 0123)  lactated ringers infusion ( Intravenous New Bag/Given 10/22/21 0000)  dextrose 5 % in lactated ringers infusion (has no administration in time range)  dextrose 50 % solution 0-50 mL (has no administration in time range)  sodium bicarbonate 150 mEq in sterile water 1,150 mL infusion ( Intravenous New Bag/Given 10/22/21 0035)  potassium chloride 10 mEq in 100 mL IVPB (10 mEq Intravenous New Bag/Given 10/22/21 0036)  vancomycin (VANCOREADY) IVPB 1250 mg/250 mL (has no administration in time range)  vancomycin variable dose per unstable renal function (pharmacist dosing) (has no administration in time range)  lactated ringers bolus 1,000 mL (has no administration in time range)  ondansetron (ZOFRAN-ODT) disintegrating tablet 4 mg (4 mg Oral Given 10/21/21 2211)  lactated ringers bolus 1,270 mL (0 mLs Intravenous Stopped 10/22/21 0000)  sodium bicarbonate injection 50 mEq (50  mEq Intravenous Given 10/21/21 2321)  ceFEPIme (MAXIPIME) 2 g in sodium chloride 0.9 % 100 mL IVPB (2 g Intravenous New Bag/Given 10/22/21 0118)    ED Course  I have reviewed the triage vital signs and  the nursing notes.  Pertinent labs & imaging results that were available during my care of the patient were reviewed by me and considered in my medical decision making (see chart for details).    MDM Rules/Calculators/A&P                         35 year old male who presented with nausea, vomiting, feeling poorly.  On arrival, he is ill-appearing, tachypneic, tachycardic with a rate in the 120s, sleepy but arousable and answering questions appropriately.  Rectal temperature of 92.8, placed on Bair hugger.  On exam, abdomen is soft and nontender, lung sounds clear.  Labs ordered in triage, reviewed and interpreted by me.  CMP with a sodium of 127, corrected 136, AKI with a creatinine 1.91 (baseline 0.8), leukocytosis of 31.8, VBG with a severe metabolic acidosis, pH of 6.8, PCO2 of 26.1, PO2 135, bicarb of 4.3.    In the setting of severe metabolic acidosis, initiated DKA protocol,  1 amp bicarb push and initiated bicarbonate infusion.  Will place on BiPAP as per discussion with Dr. Darl Householder.  Given hypothermia, will initiate blood broad-spectrum antibiotics, lactic acid  7.7, blood cultures are pending.  COVID test is pending. On multiple re-evaluations patient remains alert and protecting airway.   Initiated fluid resuscitation and insulin gtt per protocol. Repeat iSTAT VBG w/  pH 6.945, PCO2 18, PO 57m, Bicarb 3.9. Consulted critical care who will admit the patient for further management. Additional LR bolus ordered. Pt remains hemodynamically stable for admission   This was a shared visit with my supervising physician Dr. Darl Householder who independently saw and evaluated the patient & provided guidance in evaluation/management/disposition ,in agreement with care   Final Clinical Impression(s) / ED Diagnoses Final diagnoses:  Diabetic ketoacidosis without coma associated with type 1 diabetes mellitus [facility])    Rx / DC Orders ED Discharge Orders     None        Garald Balding, PA-C 10/22/21 0150     Drenda Freeze, MD 10/23/21 [phone]

## 2021-10-21 NOTE — ED Notes (Signed)
PT asked for diet ginger ale, this tech checked pt's CBG due to check in complaint of "low blood sugar". CBG was 570. This tech asked triage RN if it was okay to give pt ice chips, triage RN gave approval for 1/4th cup of ice.

## 2021-10-21 NOTE — ED Provider Notes (Signed)
Emergency Medicine Provider Triage Evaluation Note  Kevin Russell , a 35 y.o. male  was evaluated in triage.  Pt complains of hyperglycemia.  States that it feels like DKA.  Hasn't been taking insulin because he hasn't been eating due to vomiting.    Review of Systems  Positive: Tachypnea, slight confusion Negative: Diarrhea  Physical Exam  BP 106/79 (BP Location: Right Arm)    Pulse (!) 122    Resp (!) 24    SpO2 100%  Gen:   Awake, mild distress Resp:  Normal effort  MSK:   Moves extremities without difficulty  Other:    Medical Decision Making  Medically screening exam initiated at 10:06 PM.  Appropriate orders placed.  Kevin Russell was informed that the remainder of the evaluation will be completed by another provider, this initial triage assessment does not replace that evaluation, and the importance of remaining in the ED until their evaluation is complete.  Probable DKA   Kevin Russell 10/21/21 2208    Charlynne Pander, MD 10/23/21 [phone]

## 2021-10-21 NOTE — Progress Notes (Signed)
Pharmacy Antibiotic Note  Kevin Russell is a 35 y.o. male admitted on 10/21/2021 with bacteremia.  Pharmacy has been consulted for vancomycin dosing. Patient presenting with concerns for hyperglycemia and is concerned for DKA.  SCr 1.91 on metabolic panel (1.1 on I-stat) WBC 31.8  Plan: Cefepime per MD Vancomycin 1250 mg once, subsequent dosing as indicated per random vancomycin level until renal function stable and/or improved, at which time scheduled dosing can be considered Trend WBC, Fever, Renal function, & Clinical course F/u cultures, clinical course, WBC, fever De-escalate when able  Height: 5\' 11"  (180.3 cm) Weight: 63.5 kg (140 lb) IBW/kg (Calculated) : 75.3  Temp (24hrs), Avg:92.8 F (33.8 C), Min:92.8 F (33.8 C), Max:92.8 F (33.8 C)  Recent Labs  Lab 10/21/21 2215 10/21/21 2234  WBC 31.8*  --   CREATININE 1.91* 1.10    Estimated Creatinine Clearance: 82.6 mL/min (by C-G formula based on SCr of 1.1 mg/dL).    No Known Allergies  Antimicrobials this admission: cefepime 12/28 >>  vancomycin 12/28 >>   Microbiology results: Pending  Thank you for allowing pharmacy to be a part of this patients care.  1/29, PharmD, BCPS 10/21/2021 11:09 PM ED Clinical Pharmacist -  [phone]

## 2021-10-21 NOTE — Progress Notes (Signed)
RT attempted to place patient on BiPap x3, patient unable to tolerate.  RN Coralee North at bedside.  Patient has mild to moderate increase in WOB, sats 100% on room air.

## 2021-10-22 ENCOUNTER — Encounter (HOSPITAL_COMMUNITY): Payer: Self-pay | Admitting: Pulmonary Disease

## 2021-10-22 DIAGNOSIS — D75839 Thrombocytosis, unspecified: Secondary | ICD-10-CM

## 2021-10-22 DIAGNOSIS — E1010 Type 1 diabetes mellitus with ketoacidosis without coma: Principal | ICD-10-CM

## 2021-10-22 DIAGNOSIS — D72829 Elevated white blood cell count, unspecified: Secondary | ICD-10-CM

## 2021-10-22 DIAGNOSIS — E1110 Type 2 diabetes mellitus with ketoacidosis without coma: Secondary | ICD-10-CM | POA: Diagnosis present

## 2021-10-22 DIAGNOSIS — N179 Acute kidney failure, unspecified: Secondary | ICD-10-CM | POA: Diagnosis present

## 2021-10-22 DIAGNOSIS — D751 Secondary polycythemia: Secondary | ICD-10-CM

## 2021-10-22 LAB — BLOOD GAS, VENOUS
Acid-base deficit: 11.4 mmol/L — ABNORMAL HIGH (ref 0.0–2.0)
Acid-base deficit: 6 mmol/L — ABNORMAL HIGH (ref 0.0–2.0)
Bicarbonate: 13.4 mmol/L — ABNORMAL LOW (ref 20.0–28.0)
Bicarbonate: 18.5 mmol/L — ABNORMAL LOW (ref 20.0–28.0)
Drawn by: 1583
FIO2: 21
FIO2: 21
O2 Saturation: 97.9 %
O2 Saturation: 99.5 %
Patient temperature: 37
Patient temperature: 37
pCO2, Ven: 26.3 mmHg — ABNORMAL LOW (ref 44.0–60.0)
pCO2, Ven: 33.6 mmHg — ABNORMAL LOW (ref 44.0–60.0)
pH, Ven: 7.328 (ref 7.250–7.430)
pH, Ven: 7.359 (ref 7.250–7.430)
pO2, Ven: 91.2 mmHg — ABNORMAL HIGH (ref 32.0–45.0)
pO2, Ven: 168 mmHg — ABNORMAL HIGH (ref 32.0–45.0)

## 2021-10-22 LAB — I-STAT VENOUS BLOOD GAS, ED
Acid-base deficit: 27 mmol/L — ABNORMAL HIGH (ref 0.0–2.0)
Bicarbonate: 3.9 mmol/L — ABNORMAL LOW (ref 20.0–28.0)
Calcium, Ion: 1.08 mmol/L — ABNORMAL LOW (ref 1.15–1.40)
HCT: 49 % (ref 39.0–52.0)
Hemoglobin: 16.7 g/dL (ref 13.0–17.0)
O2 Saturation: 61 %
Potassium: 4.5 mmol/L (ref 3.5–5.1)
Sodium: 128 mmol/L — ABNORMAL LOW (ref 135–145)
TCO2: <5 mmol/L — ABNORMAL LOW (ref 22–32)
pCO2, Ven: 18 mmHg — CL (ref 44.0–60.0)
pH, Ven: 6.945 — CL (ref 7.250–7.430)
pO2, Ven: 49 mmHg — ABNORMAL HIGH (ref 32.0–45.0)

## 2021-10-22 LAB — BASIC METABOLIC PANEL
Anion gap: 20 — ABNORMAL HIGH (ref 5–15)
Anion gap: 14 (ref 5–15)
Anion gap: 8 (ref 5–15)
Anion gap: 4 — ABNORMAL LOW (ref 5–15)
BUN: 26 mg/dL — ABNORMAL HIGH (ref 6–20)
BUN: 23 mg/dL — ABNORMAL HIGH (ref 6–20)
BUN: 19 mg/dL (ref 6–20)
BUN: 16 mg/dL (ref 6–20)
BUN: 13 mg/dL (ref 6–20)
CO2: <7 mmol/L — ABNORMAL LOW (ref 22–32)
CO2: 10 mmol/L — ABNORMAL LOW (ref 22–32)
CO2: 13 mmol/L — ABNORMAL LOW (ref 22–32)
CO2: 18 mmol/L — ABNORMAL LOW (ref 22–32)
CO2: 22 mmol/L (ref 22–32)
Calcium: 7.9 mg/dL — ABNORMAL LOW (ref 8.9–10.3)
Calcium: 7.7 mg/dL — ABNORMAL LOW (ref 8.9–10.3)
Calcium: 7.8 mg/dL — ABNORMAL LOW (ref 8.9–10.3)
Calcium: 8.1 mg/dL — ABNORMAL LOW (ref 8.9–10.3)
Calcium: 8.3 mg/dL — ABNORMAL LOW (ref 8.9–10.3)
Chloride: 93 mmol/L — ABNORMAL LOW (ref 98–111)
Chloride: 98 mmol/L (ref 98–111)
Chloride: 100 mmol/L (ref 98–111)
Chloride: 106 mmol/L (ref 98–111)
Chloride: 108 mmol/L (ref 98–111)
Creatinine, Ser: 2.05 mg/dL — ABNORMAL HIGH (ref 0.61–1.24)
Creatinine, Ser: 1.78 mg/dL — ABNORMAL HIGH (ref 0.61–1.24)
Creatinine, Ser: 1.48 mg/dL — ABNORMAL HIGH (ref 0.61–1.24)
Creatinine, Ser: 1.12 mg/dL (ref 0.61–1.24)
Creatinine, Ser: 0.93 mg/dL (ref 0.61–1.24)
GFR, Estimated: 42 mL/min — ABNORMAL LOW (ref >60)
GFR, Estimated: 50 mL/min — ABNORMAL LOW (ref >60)
GFR, Estimated: >60 mL/min (ref >60)
GFR, Estimated: >60 mL/min (ref >60)
GFR, Estimated: >60 mL/min (ref >60)
Glucose, Bld: 590 mg/dL (ref 70–99)
Glucose, Bld: 260 mg/dL — ABNORMAL HIGH (ref 70–99)
Glucose, Bld: 232 mg/dL — ABNORMAL HIGH (ref 70–99)
Glucose, Bld: 214 mg/dL — ABNORMAL HIGH (ref 70–99)
Glucose, Bld: 209 mg/dL — ABNORMAL HIGH (ref 70–99)
Potassium: 4.5 mmol/L (ref 3.5–5.1)
Potassium: 4.3 mmol/L (ref 3.5–5.1)
Potassium: 3.8 mmol/L (ref 3.5–5.1)
Potassium: 3.8 mmol/L (ref 3.5–5.1)
Potassium: 3.5 mmol/L (ref 3.5–5.1)
Sodium: 128 mmol/L — ABNORMAL LOW (ref 135–145)
Sodium: 128 mmol/L — ABNORMAL LOW (ref 135–145)
Sodium: 127 mmol/L — ABNORMAL LOW (ref 135–145)
Sodium: 132 mmol/L — ABNORMAL LOW (ref 135–145)
Sodium: 134 mmol/L — ABNORMAL LOW (ref 135–145)

## 2021-10-22 LAB — RAPID URINE DRUG SCREEN, HOSP PERFORMED
Amphetamines: NOT DETECTED
Barbiturates: NOT DETECTED
Benzodiazepines: NOT DETECTED
Cocaine: NOT DETECTED
Opiates: NOT DETECTED
Tetrahydrocannabinol: NOT DETECTED

## 2021-10-22 LAB — URINALYSIS, ROUTINE W REFLEX MICROSCOPIC
Bilirubin Urine: NEGATIVE
Glucose, UA: >=500 mg/dL — AB
Ketones, ur: 80 mg/dL — AB
Leukocytes,Ua: NEGATIVE
Nitrite: NEGATIVE
Protein, ur: 100 mg/dL — AB
Specific Gravity, Urine: 1.018 (ref 1.005–1.030)
pH: 5 (ref 5.0–8.0)

## 2021-10-22 LAB — RESPIRATORY PANEL BY PCR

## 2021-10-22 LAB — CBG MONITORING, ED
Glucose-Capillary: 193 mg/dL — ABNORMAL HIGH (ref 70–99)
Glucose-Capillary: 218 mg/dL — ABNORMAL HIGH (ref 70–99)
Glucose-Capillary: 222 mg/dL — ABNORMAL HIGH (ref 70–99)
Glucose-Capillary: 229 mg/dL — ABNORMAL HIGH (ref 70–99)
Glucose-Capillary: 239 mg/dL — ABNORMAL HIGH (ref 70–99)
Glucose-Capillary: 250 mg/dL — ABNORMAL HIGH (ref 70–99)
Glucose-Capillary: 250 mg/dL — ABNORMAL HIGH (ref 70–99)
Glucose-Capillary: 253 mg/dL — ABNORMAL HIGH (ref 70–99)
Glucose-Capillary: 325 mg/dL — ABNORMAL HIGH (ref 70–99)
Glucose-Capillary: 443 mg/dL — ABNORMAL HIGH (ref 70–99)
Glucose-Capillary: 485 mg/dL — ABNORMAL HIGH (ref 70–99)
Glucose-Capillary: 574 mg/dL (ref 70–99)
Glucose-Capillary: >600 mg/dL (ref 70–99)
Glucose-Capillary: >600 mg/dL (ref 70–99)

## 2021-10-22 LAB — LACTIC ACID, PLASMA
Lactic Acid, Venous: 7.7 mmol/L (ref 0.5–1.9)
Lactic Acid, Venous: 6.2 mmol/L (ref 0.5–1.9)

## 2021-10-22 LAB — MAGNESIUM: Magnesium: 1.5 mg/dL — ABNORMAL LOW (ref 1.7–2.4)

## 2021-10-22 LAB — GLUCOSE, CAPILLARY
Glucose-Capillary: 152 mg/dL — ABNORMAL HIGH (ref 70–99)
Glucose-Capillary: 158 mg/dL — ABNORMAL HIGH (ref 70–99)
Glucose-Capillary: 166 mg/dL — ABNORMAL HIGH (ref 70–99)
Glucose-Capillary: 306 mg/dL — ABNORMAL HIGH (ref 70–99)
Glucose-Capillary: 208 mg/dL — ABNORMAL HIGH (ref 70–99)

## 2021-10-22 LAB — HIV ANTIBODY (ROUTINE TESTING W REFLEX): HIV Screen 4th Generation wRfx: NONREACTIVE

## 2021-10-22 LAB — BETA-HYDROXYBUTYRIC ACID
Beta-Hydroxybutyric Acid: 3.61 mmol/L — ABNORMAL HIGH (ref 0.05–0.27)
Beta-Hydroxybutyric Acid: 0.59 mmol/L — ABNORMAL HIGH (ref 0.05–0.27)

## 2021-10-22 LAB — PROCALCITONIN: Procalcitonin: 9.45 ng/mL

## 2021-10-22 MED ORDER — SODIUM CHLORIDE 0.9 % IV SOLN
1.0000 g | INTRAVENOUS | Status: DC
  Administered 2021-10-22: 23:00:00 1 g via INTRAVENOUS
  Filled 2021-10-22 (×2): qty 10

## 2021-10-22 MED ORDER — MAGNESIUM SULFATE 2 GM/50ML IV SOLN
2.0000 g | Freq: Once | INTRAVENOUS | Status: AC
  Administered 2021-10-22: 08:00:00 2 g via INTRAVENOUS
  Filled 2021-10-22: qty 50

## 2021-10-22 MED ORDER — ENOXAPARIN SODIUM 40 MG/0.4ML IJ SOSY
40.0000 mg | PREFILLED_SYRINGE | INTRAMUSCULAR | Status: DC
  Administered 2021-10-22 – 2021-10-24 (×3): 40 mg via SUBCUTANEOUS
  Filled 2021-10-22 (×3): qty 0.4

## 2021-10-22 MED ORDER — INSULIN ASPART 100 UNIT/ML IJ SOLN
0.0000 [IU] | Freq: Three times a day (TID) | INTRAMUSCULAR | Status: DC
  Administered 2021-10-22: 21:00:00 11 [IU] via SUBCUTANEOUS
  Administered 2021-10-23: 09:00:00 8 [IU] via SUBCUTANEOUS
  Administered 2021-10-23 (×2): 3 [IU] via SUBCUTANEOUS
  Administered 2021-10-24: 8 [IU] via SUBCUTANEOUS

## 2021-10-22 MED ORDER — LACTATED RINGERS IV BOLUS
1000.0000 mL | Freq: Once | INTRAVENOUS | Status: AC
  Administered 2021-10-22: 03:00:00 1000 mL via INTRAVENOUS

## 2021-10-22 MED ORDER — LACTATED RINGERS IV BOLUS
500.0000 mL | Freq: Once | INTRAVENOUS | Status: AC
  Administered 2021-10-22: 14:00:00 500 mL via INTRAVENOUS

## 2021-10-22 MED ORDER — INSULIN ASPART 100 UNIT/ML IJ SOLN: 0.0000 [IU] | Freq: Three times a day (TID) | INTRAMUSCULAR | Status: DC

## 2021-10-22 MED ORDER — CALCIUM GLUCONATE-NACL 1-0.675 GM/50ML-% IV SOLN
1.0000 g | Freq: Once | INTRAVENOUS | Status: AC
  Administered 2021-10-22: 14:00:00 1000 mg via INTRAVENOUS
  Filled 2021-10-22: qty 50

## 2021-10-22 MED ORDER — DEXTROSE 5 % IV SOLN
250.0000 mg | INTRAVENOUS | Status: DC
  Administered 2021-10-22: 19:00:00 250 mg via INTRAVENOUS
  Filled 2021-10-22 (×2): qty 2.5

## 2021-10-22 MED ORDER — SODIUM CHLORIDE 0.9 % IV SOLN
2.0000 g | Freq: Two times a day (BID) | INTRAVENOUS | Status: DC
  Administered 2021-10-22: 12:00:00 2 g via INTRAVENOUS
  Filled 2021-10-22: qty 2

## 2021-10-22 MED ORDER — INSULIN DETEMIR 100 UNIT/ML ~~LOC~~ SOLN
9.0000 [IU] | Freq: Two times a day (BID) | SUBCUTANEOUS | Status: DC
  Administered 2021-10-22 – 2021-10-24 (×4): 9 [IU] via SUBCUTANEOUS
  Filled 2021-10-22 (×5): qty 0.09

## 2021-10-22 MED ORDER — VANCOMYCIN HCL 1250 MG/250ML IV SOLN: 1250.0000 mg | INTRAVENOUS | Status: DC

## 2021-10-22 NOTE — Progress Notes (Signed)
Inpatient Diabetes Program Recommendations  AACE/ADA: New Consensus Statement on Inpatient Glycemic Control (2015)  Target Ranges:  Prepandial:   less than 140 mg/dL      Peak postprandial:   less than 180 mg/dL (1-2 hours)      Critically ill patients:  140 - 180 mg/dL   Lab Results  Component Value Date   GLUCAP 193 (H) 10/22/2021   HGBA1C 9.6 (H) 05/13/2019    Review of Glycemic Control  Latest Reference Range & Units 10/22/21 11:35 10/22/21 12:58 10/22/21 13:49  Glucose-Capillary 70 - 99 mg/dL 259 (H) 563 (H) 875 (H)  (H): Data is abnormally high Diabetes history: Type 1 DM Outpatient Diabetes medications: Novolin 70/30 15 units BID Current orders for Inpatient glycemic control: IV insulin  Inpatient Diabetes Program Recommendations:    Consider adding A1C as previous one was from 04/2019.  When ready to transition, consider Novolog 70/30 12 units two hours prior to discontinuation, then BID to follow and Novolog 0-6 units TID & HS (if diet order added).  Spoke with patient regarding outpatient diabetes management. Verified home medications. Patient obtains insulin from [facility]; he self manages. Reports CBGs usually run in the 250-300 range, then he became sick and did not take insulin.  Reviewed patient's previous A1c of 9.6% and that he needs an updated A1C. Explained what a A1c is and what it measures. Also reviewed goal A1c with patient, importance of good glucose control @ home, and blood sugar goals. Reviewed patho of DM, need for improved control, DKA, role of pancreas, vascular changes and co-morbidities. Has a meter and testing supplies.  Denies missing doses. Reviewed sick day rules. Will need PCP- TOC consult placed.   Thanks, Lujean Rave, MSN, RNC-OB Diabetes Coordinator [phone] (8a-5p)

## 2021-10-22 NOTE — Plan of Care (Signed)
°  Problem: Education: Goal: Knowledge of General Education information will improve Description: Including pain rating scale, medication(s)/side effects and non-pharmacologic comfort measures Outcome: Progressing   Problem: Health Behavior/Discharge Planning: Goal: Ability to manage health-related needs will improve Outcome: Progressing   Problem: Clinical Measurements: Goal: Ability to maintain clinical measurements within normal limits will improve Outcome: Progressing Goal: Will remain free from infection Outcome: Progressing Goal: Diagnostic test results will improve Outcome: Progressing Goal: Respiratory complications will improve Outcome: Progressing Goal: Cardiovascular complication will be avoided Outcome: Progressing   Problem: Nutrition: Goal: Adequate nutrition will be maintained Outcome: Progressing   Problem: Safety: Goal: Ability to remain free from injury will improve Outcome: Progressing

## 2021-10-22 NOTE — Progress Notes (Signed)
Pharmacy Antibiotic Note  Kevin Russell is a 35 y.o. male admitted on 10/21/2021 with rule out sepsis.  Pharmacy has been consulted for Cefepime dosing. Already on vancomycin. Acute renal failure in setting of DKA.   Plan: Add Cefepime 2g IV q12h Already on vancomycin   Height: 5\' 11"  (180.3 cm) Weight: 63.5 kg (140 lb) IBW/kg (Calculated) : 75.3  Temp (24hrs), Avg:92.8 F (33.8 C), Min:92.8 F (33.8 C), Max:92.8 F (33.8 C)  Recent Labs  Lab 10/21/21 2215 10/21/21 2234 10/21/21 2250 10/22/21 0100  WBC 31.8*  --   --   --   CREATININE 1.91* 1.10  --  2.05*  LATICACIDVEN  --   --  7.7* 6.2*    Estimated Creatinine Clearance: 44.3 mL/min (A) (by C-G formula based on SCr of 2.05 mg/dL (H)).    No Known Allergies  10/24/21, PharmD, BCPS Clinical Pharmacist Phone: [phone]

## 2021-10-22 NOTE — Progress Notes (Addendum)
°  PCCM Interval Note    4 yoM with hx of IDDM (type 1DM) admitted early this am with DKA and AKI after 2 day hx of N/V and not taking insulin.  Empirically started on abx, no clear precipitant.   Patient found sleeping.  Awakens easily.  Wants to eat.  Denies any SOB, chills, N/V, abdominal or other pain.   Likely to transition off insulin gtt soon, now < 250 and most recent AG as of 1150 of 8.  AKI resolved as well with sCr 2.05 > 1.12  S/p 2.2L fluids in ER Has voided x 3, unmeasured  Patient additionally reports that he does not have provider/ PCP and he buys his insulin OTC at [facility].  Reports hx of DM type 1 which was diagnosed in 2019. He does not have insurance.    Blood pressure 112/69, pulse (!) 114, temperature 98.8 F (37.1 C), temperature source Oral, resp. rate 15, height 5\' 11"  (1.803 m), weight 63.5 kg, SpO2 100 %.  General:  Adult male lying in ER stretcher, no distress HEENT: MM pink/moist Neuro: easily awakens from sleep, oriented x3, MAE CV: rr, no murmur PULM:  non labored, CTA, room air GI: soft, bsx4 active  Extremities: warm/dry, no LE edema     DKA AKI, resolved AGMA, resolved Leukocytosis - No clear trigger for DKA, question if related to poor vs incorrect medical management.  P:  - other than slight tachycardia, he is hemodynamically stable and metabolic derangements are correcting.  He no longer requires ICU level of care and can go to PCU.   - continue insulin gtt per protocol; transitioning off soon - additional LR bolus 500 ml for ongoing tachycardia - remains afebrile, CXR and UA clear.  Low suspicion for infectious etiology.  If PCT reassuring, will stop abx (no need for nosocomial coverage), follow cultures, and clinically monitor.  CBC in am.  - DM coordinator already consulted - TOC consult placed to see if we can get him medication assistance, ?if he qualifies for medicaid, and most importantly a provider who will manage his diabetes,  possibly [facility]  - iCa on VBG noted at 1.08, will give calcium 1gm.  Mag of 1.5 has been repleted and will recheck in am.     Signed out to [facility] who continue medical care on 12/30; PCCM will be available as needed       1/31, ACNP [facility] 10/22/2021, 1:28 PM  See Amion for pager If no response to pager, please call PCCM consult pager After 7:00 pm call Elink

## 2021-10-22 NOTE — Progress Notes (Signed)
Pt arrived to the unit from ED via stretcher, A& O, able to walk to bed, wish not to notify family or friends.

## 2021-10-22 NOTE — ED Notes (Signed)
Spoke to S.O on the phone. Alert to voice. Oriented x 4. No complaints of pain.

## 2021-10-22 NOTE — H&P (Signed)
NAME:  Kevin Russell, MRN:  [id number], DOB:  [birth year], LOS: 0 ADMISSION DATE:  10/21/2021, CONSULTATION DATE:  12/29 REFERRING MD:  Dr. Marlon Pel ED, CHIEF COMPLAINT:  DKA   History of Present Illness:  35 year old male with past medical history as below, which is significant for DM type I with previous DKA.  He presented to [facility] emergency department on 12/28 with complaints of nausea, vomiting, and weakness x2 days.  He reports good compliance with blood glucose testing and insulin regimen, however on 12/27 he developed nausea and vomiting preventing him from eating which is why he stopped using insulin at that time.  Upon arrival to the emergency department he was somnolent and tachycardic with rates in the 120s.  Laboratory evaluation significant for capillary blood glucose greater than 600, venous pH 6.82, bicarb less than 7.  This presumably represent DKA he was provided with IV fluids as well as insulin infusion.  He is ultimately bolused an amp of bicarb and started on infusion as well.  Due to severe acidosis PCCM was asked to admit.  Pertinent  Medical History   has a past medical history of Diabetes mellitus without complication ([place]) and Suicidal ideation.   Significant Hospital Events: Including procedures, antibiotic start and stop dates in addition to other pertinent events   12/29 admit for DKA  Interim History / Subjective:    Objective   Blood pressure 127/90, pulse (!) 142, temperature (!) 92.8 F (33.8 C), temperature source Rectal, resp. rate (!) 26, height 5\' 11"  (1.803 m), weight 63.5 kg, SpO2 100 %.        Intake/Output Summary (Last 24 hours) at 10/22/2021 0230 Last data filed at 10/22/2021 [id number] Gross per 24 hour  Intake --  Output 1275 ml  Net -1275 ml   Filed Weights   10/21/21 2240  Weight: 63.5 kg    Examination: General: Adult male in NAD HENT: [place]/AT, PERRL, no JVD. MM dry Lungs: Clear bilateral breath sounds.  Cardiovascular: Tachy,  regular, no MRG Abdomen: Soft, non-tender, non-distended Extremities: No acute deformity or ROM limitation Neuro: Alert, oriented, non-focal.   Resolved Hospital Problem list     Assessment & Plan:    DKA: known type one diabetic. Unclear what triggered DKA. Symptoms began 12/27. pH 6.8 on arrival.  - Continue insulin infusion via endotool  - Giving additional IVF - Ok to continue bicarb infusion for now. Discontinue once pH reaches 7.2 - Admit to ICU for close monitoring - Holding home 70/30 - Zofran for nausea - OK for clear liquids - Trend BMP q 4 hours  Leukocytosis: likely reactive - ok to continue broad ABX for now. Low threshold to dc  AKI - hydrate  - Follow BMP    Best Practice (right click and "Reselect all SmartList Selections" daily)   Diet/type: clear liquids DVT prophylaxis: LMWH GI prophylaxis: PPI Lines: N/A Foley:  N/A Code Status:  full code Last date of multidisciplinary goals of care discussion [ ]   Labs   CBC: Recent Labs  Lab 10/21/21 2215 10/21/21 2234 10/22/21 0108  WBC 31.8*  --   --   NEUTROABS 25.1*  --   --   HGB 17.6* 18.7*   19.0* 16.7  HCT 53.9* 55.0*   56.0* 49.0  MCV 93.9  --   --   PLT 453*  --   --     Basic Metabolic Panel: Recent Labs  Lab 10/21/21 2215 10/21/21 2234 10/22/21 0100 10/22/21 0108  NA 127* 126*   127* 128* 128*  K 4.4 4.3   4.3 4.5 4.5  CL 88* 101 93*  --   CO2 <7*  --  <7*  --   GLUCOSE 675* 662* 590*  --   BUN 23* 26* 26*  --   CREATININE 1.91* 1.10 2.05*  --   CALCIUM 9.2  --  7.9*  --    GFR: Estimated Creatinine Clearance: 44.3 mL/min (A) (by C-G formula based on SCr of 2.05 mg/dL (H)). Recent Labs  Lab 10/21/21 2215 10/21/21 2250 10/22/21 0100  WBC 31.8*  --   --   LATICACIDVEN  --  7.7* 6.2*    Liver Function Tests: Recent Labs  Lab 10/21/21 2215  AST 33  ALT 26  ALKPHOS 142*  BILITOT 1.8*  PROT 7.9  ALBUMIN 4.4   No results for input(s): LIPASE, AMYLASE in the last  168 hours. No results for input(s): AMMONIA in the last 168 hours.  ABG    Component Value Date/Time   HCO3 3.9 (L) 10/22/2021 0108   TCO2 <5 (L) 10/22/2021 0108   ACIDBASEDEF 27.0 (H) 10/22/2021 0108   O2SAT 61.0 10/22/2021 0108     Coagulation Profile: No results for input(s): INR, PROTIME in the last 168 hours.  Cardiac Enzymes: No results for input(s): CKTOTAL, CKMB, CKMBINDEX, TROPONINI in the last 168 hours.  HbA1C: Hgb A1c MFr Bld  Date/Time Value Ref Range Status  05/13/2019 05:05 PM 9.6 (H) 4.8 - 5.6 % Final    Comment:    (NOTE) Pre diabetes:          5.7%-6.4% Diabetes:              >6.4% Glycemic control for   <7.0% adults with diabetes   05/13/2019 03:16 AM 9.8 (H) 4.8 - 5.6 % Final    Comment:    (NOTE) Pre diabetes:          5.7%-6.4% Diabetes:              >6.4% Glycemic control for   <7.0% adults with diabetes     CBG: Recent Labs  Lab 10/21/21 2044 10/22/21 0006 10/22/21 0047 10/22/21 0122 10/22/21 0215  GLUCAP 570* >600* >600* 574* 485*    Review of Systems:   Bolds are positive  Constitutional: weight loss, gain, night sweats, Fevers, chills, fatigue .  HEENT: headaches, Sore throat, sneezing, nasal congestion, post nasal drip, Difficulty swallowing, Tooth/dental problems, visual complaints visual changes, ear ache CV:  chest pain, radiates:,Orthopnea, PND, swelling in lower extremities, dizziness, palpitations, syncope.  GI  heartburn, indigestion, abdominal pain, nausea, vomiting, diarrhea, change in bowel habits, loss of appetite, bloody stools.  Resp: cough, productive: , hemoptysis, dyspnea, chest pain, pleuritic.  Skin: rash or itching or icterus GU: dysuria, change in color of urine, urgency or frequency. flank pain, hematuria  MS: joint pain or swelling. decreased range of motion  Psych: change in mood or affect. depression or anxiety.  Neuro: difficulty with speech, weakness, numbness, ataxia    Past Medical History:  He,   has a past medical history of Diabetes mellitus without complication (HCC) and Suicidal ideation.   Surgical History:  History reviewed. No pertinent surgical history.   Social History:   reports that he has been smoking cigarettes. He has never used smokeless tobacco. He reports current alcohol use. He reports current drug use. Drug: Marijuana.   Family History:  His family history is not on file.   Allergies No  Known Allergies   Home Medications  Prior to Admission medications   Medication Sig Start Date End Date Taking? Authorizing Provider  ARIPiprazole (ABILIFY) 10 MG tablet Take 1 tablet (10 mg total) by mouth daily. 05/18/19   Connye Burkitt, NP  fluvoxaMINE (LUVOX) 100 MG tablet Take 1 tablet (100 mg total) by mouth daily. 05/18/19   Connye Burkitt, NP  insulin aspart protamine- aspart (NOVOLOG MIX 70/30) (70-30) 100 UNIT/ML injection Inject 0.2 mLs (20 Units total) into the skin 2 (two) times daily with a meal. Patient taking differently: Inject 15 Units into the skin daily with supper.  08/23/18   Clovis Fredrickson, MD     Critical care time: 42 minutes     Georgann Housekeeper, AGACNP-BC [facility]  See Amion for personal pager PCCM on call pager [phone] until 7pm. Please call Elink 7p-7a. [phone]  10/22/2021 3:05 AM

## 2021-10-23 LAB — CBC
HCT: 35.9 % — ABNORMAL LOW (ref 39.0–52.0)
Hemoglobin: 13.4 g/dL (ref 13.0–17.0)
MCH: 30.6 pg (ref 26.0–34.0)
MCHC: 37.3 g/dL — ABNORMAL HIGH (ref 30.0–36.0)
MCV: 82 fL (ref 80.0–100.0)
Platelets: 247 10*3/uL (ref 150–400)
RBC: 4.38 MIL/uL (ref 4.22–5.81)
RDW: 12.9 % (ref 11.5–15.5)
WBC: 11.3 10*3/uL — ABNORMAL HIGH (ref 4.0–10.5)
nRBC: 0 % (ref 0.0–0.2)

## 2021-10-23 LAB — BASIC METABOLIC PANEL
Anion gap: 6 (ref 5–15)
BUN: 9 mg/dL (ref 6–20)
CO2: 23 mmol/L (ref 22–32)
Calcium: 8.3 mg/dL — ABNORMAL LOW (ref 8.9–10.3)
Chloride: 107 mmol/L (ref 98–111)
Creatinine, Ser: 0.82 mg/dL (ref 0.61–1.24)
GFR, Estimated: >60 mL/min (ref >60)
Glucose, Bld: 194 mg/dL — ABNORMAL HIGH (ref 70–99)
Potassium: 3.2 mmol/L — ABNORMAL LOW (ref 3.5–5.1)
Sodium: 136 mmol/L (ref 135–145)

## 2021-10-23 LAB — HEMOGLOBIN A1C
Hgb A1c MFr Bld: >15.5 % — ABNORMAL HIGH (ref 4.8–5.6)
Mean Plasma Glucose: >398 mg/dL

## 2021-10-23 LAB — GLUCOSE, CAPILLARY
Glucose-Capillary: 264 mg/dL — ABNORMAL HIGH (ref 70–99)
Glucose-Capillary: 184 mg/dL — ABNORMAL HIGH (ref 70–99)
Glucose-Capillary: 197 mg/dL — ABNORMAL HIGH (ref 70–99)
Glucose-Capillary: 265 mg/dL — ABNORMAL HIGH (ref 70–99)

## 2021-10-23 LAB — MAGNESIUM: Magnesium: 1.9 mg/dL (ref 1.7–2.4)

## 2021-10-23 LAB — CALCIUM, IONIZED: Calcium, Ionized, Serum: 4.5 mg/dL (ref 4.5–5.6)

## 2021-10-23 LAB — PROCALCITONIN: Procalcitonin: 7.22 ng/mL

## 2021-10-23 MED ORDER — SODIUM CHLORIDE 0.9 % IV SOLN
2.0000 g | INTRAVENOUS | Status: DC
  Administered 2021-10-23: 22:00:00 2 g via INTRAVENOUS
  Filled 2021-10-23: qty 20

## 2021-10-23 MED ORDER — INSULIN ASPART 100 UNIT/ML IJ SOLN
3.0000 [IU] | Freq: Three times a day (TID) | INTRAMUSCULAR | Status: DC
  Administered 2021-10-23 – 2021-10-24 (×3): 3 [IU] via SUBCUTANEOUS

## 2021-10-23 MED ORDER — AZITHROMYCIN 250 MG PO TABS
250.0000 mg | ORAL_TABLET | Freq: Every day | ORAL | Status: DC
  Administered 2021-10-23: 18:00:00 250 mg via ORAL
  Filled 2021-10-23 (×2): qty 1

## 2021-10-23 MED ORDER — POTASSIUM CHLORIDE CRYS ER 20 MEQ PO TBCR
40.0000 meq | EXTENDED_RELEASE_TABLET | Freq: Once | ORAL | Status: AC
  Administered 2021-10-23: 14:00:00 40 meq via ORAL
  Filled 2021-10-23: qty 2

## 2021-10-23 NOTE — Progress Notes (Signed)
PHARMACIST - PHYSICIAN COMMUNICATION DR:   Sharl Ma CONCERNING: Antibiotic IV to Oral Route Change Policy  RECOMMENDATION: This patient is receiving azithromycin by the intravenous route.  Based on criteria approved by the Pharmacy and Therapeutics Committee, the antibiotic(s) is/are being converted to the equivalent oral dose form(s).   DESCRIPTION: These criteria include: Patient being treated for a respiratory tract infection, urinary tract infection, cellulitis or clostridium difficile associated diarrhea if on metronidazole The patient is not neutropenic and does not exhibit a GI malabsorption state The patient is eating (either orally or via tube) and/or has been taking other orally administered medications for a least 24 hours The patient is improving clinically and has a Tmax < 100.5  If you have questions about this conversion, please contact the Pharmacy Department  []   [phone] )  ( [phone] []   [phone] )  [facility] [x]   [phone] )  [facility] []   [phone] )  [facility] []   [phone] )  [facility]

## 2021-10-23 NOTE — Plan of Care (Signed)
  Problem: Education: Goal: Knowledge of General Education information will improve Description: Including pain rating scale, medication(s)/side effects and non-pharmacologic comfort measures Outcome: Progressing   Problem: Clinical Measurements: Goal: Will remain free from infection Outcome: Progressing   Problem: Nutrition: Goal: Adequate nutrition will be maintained Outcome: Progressing   Problem: Safety: Goal: Ability to remain free from injury will improve Outcome: Progressing   

## 2021-10-23 NOTE — TOC Progression Note (Signed)
Transition of Care [facility]) - Progression Note    Patient Details  Name: Kevin Russell MRN: [id number] Date of Birth: [birth year]  [facility] of [facility]) CM/SW Contact  Nadene Rubins Adria Devon, RN Phone Number: 10/23/2021, 11:22 AM  Clinical Narrative:     Spoke to patient, confirmed face sheet information. Patient needing PCP, agreeable to a [facility]. Scheduled follow up appointment with Gwinda Passe NP on November 20, 2021 at 1050 am. Placed information on face sheet.   Made financial counselor consult. Changed pharmacy to [facility], will see if discharge medications covered by [facility]   Expected Discharge Plan: Home/Self Care    Expected Discharge Plan and Services Expected Discharge Plan: Home/Self Care In-house Referral: Financial Counselor Discharge Planning Services: CM Consult   Living arrangements for the past 2 months: Apartment                 DME Arranged: N/A DME Agency: NA       HH Arranged: NA           Social Determinants of Health (SDOH) Interventions    Readmission Risk Interventions No flowsheet data found.

## 2021-10-23 NOTE — Progress Notes (Signed)
Inpatient Diabetes Program Recommendations  AACE/ADA: New Consensus Statement on Inpatient Glycemic Control (2015)  Target Ranges:  Prepandial:   less than 140 mg/dL      Peak postprandial:   less than 180 mg/dL (1-2 hours)      Critically ill patients:  140 - 180 mg/dL   Lab Results  Component Value Date   GLUCAP 264 (H) 10/23/2021   HGBA1C 9.6 (H) 05/13/2019    Review of Glycemic Control  Latest Reference Range & Units 10/22/21 20:03 10/22/21 23:04 10/23/21 07:58  Glucose-Capillary 70 - 99 mg/dL 564 (H) 332 (H) 951 (H)  (H): Data is abnormally high Diabetes history: Type 1 DM Outpatient Diabetes medications: Novolin 70/30 15 units BID Current orders for Inpatient glycemic control: Levemir 9 units BID, Novolog 0-15 units TID   Inpatient Diabetes Program Recommendations:   Consider adding Novolog 3 units TID (Assuming patient is consuming >50% of meals) and A1C.  Addendum: Spoke with patient again now that feeling better regarding outpatient diabetes control. Reviewed patient's current A1c of >15.5%. Explained what a A1c is and what it measures. Also reviewed goal A1c with patient, importance of good glucose control @ home, and blood sugar goals. Reviewed patho of DM, need for improved control, role of pancreas, DKA, impact of poor glycemic control, 70/30, survival skills, vascular changes and co-morbidies. Patient has a meter and checks. Reviewed when to call MD.  Reports that he was only able to take 70/30 once a day due to his work schedule. He says that he has done this since diagnosis. Reviewed principles of action of 70/30, when to take, how to adjust his schedule, goal setting, making changes, dosing, and importance of following up with PCP. Patient agrees to go to appointment and has no further questions at this time.   Thanks, Lujean Rave, MSN, RNC-OB Diabetes Coordinator [phone] (8a-5p)    Thanks, Lujean Rave, MSN, RNC-OB Diabetes  Coordinator [phone] (8a-5p)

## 2021-10-23 NOTE — Progress Notes (Signed)
[facility]  PROGRESS NOTE  Kevin Russell UJW:[id number] DOB: 1985-12-17 DOA: 10/21/2021 PCP: Grayce Sessions, NP   Brief HPI:   35 year old male with history of diabetes mellitus type 1 who was admitted with DKA after 2-day history of nausea vomiting and not taking insulin.  Empirically was started on antibiotics.  He was started on IV insulin which has been transitioned to subcu insulin.    Subjective   Patient seen and examined, denies any complaints.  Tolerating diet well.   Assessment/Plan:   Diabetic ketoacidosis -Resolved -IV insulin has been transitioned to subcu insulin -Continue sliding scale insulin with NovoLog, Levemir 9 units subcu twice daily -CBG still elevated, will add NovoLog 3 units 3 times daily meal coverage  Leukocytosis -Likely reactive, WBC was 31,000 on 12/28, improved to 11,000 today -He was empirically started on Zithromax and ceftriaxone -We will continue antibiotics in the hospital   Acute kidney injury -Resolved with IV fluids  Hypokalemia -Potassium was 3.2 -Replace potassium and follow BMP in am    Medications     azithromycin  250 mg Oral q1800   enoxaparin (LOVENOX) injection  40 mg Subcutaneous Q24H   insulin aspart  0-15 Units Subcutaneous TID WC   insulin aspart  3 Units Subcutaneous TID WC   insulin detemir  9 Units Subcutaneous BID     Data Reviewed:   CBG:  Recent Labs  Lab 10/22/21 1700 10/22/21 2003 10/22/21 2304 10/23/21 0758 10/23/21 1140  GLUCAP 166* 306* 208* 264* 184*    SpO2: 100 %    Vitals:   10/23/21 0030 10/23/21 0544 10/23/21 0800 10/23/21 1300  BP: 102/74 101/82 108/81 101/82  Pulse: (!) 116 100 90 100  Resp: 13 16 18    Temp: 98.3 F (36.8 C) 98.6 F (37 C) 98.9 F (37.2 C) 98.6 F (37 C)  TempSrc: Oral Oral Oral Oral  SpO2: 98% 100% 100% 100%  Weight:      Height:         Intake/Output Summary (Last 24 hours) at 10/23/2021 1458 Last data filed at 10/23/2021 1300 Gross  per 24 hour  Intake 779.79 ml  Output 1750 ml  Net -970.21 ml    12/28 1901 - 12/30 0700 In: 439.6 [I.V.:74.8] Out: 5550 [Urine:5550]  Filed Weights   10/21/21 2240  Weight: 63.5 kg    Data Reviewed: Basic Metabolic Panel: Recent Labs  Lab 10/22/21 0309 10/22/21 0725 10/22/21 1150 10/22/21 1802 10/23/21 0304  NA 128* 127* 132* 134* 136  K 4.3 3.8 3.8 3.5 3.2*  CL 98 100 106 108 107  CO2 10* 13* 18* 22 23  GLUCOSE 260* 232* 214* 209* 194*  BUN 23* 19 16 13 9   CREATININE 1.78* 1.48* 1.12 0.93 0.82  CALCIUM 7.7* 7.8* 8.1* 8.3* 8.3*  MG 1.5*  --   --   --  1.9   Liver Function Tests: Recent Labs  Lab 10/21/21 2215  AST 33  ALT 26  ALKPHOS 142*  BILITOT 1.8*  PROT 7.9  ALBUMIN 4.4   No results for input(s): LIPASE, AMYLASE in the last 168 hours. No results for input(s): AMMONIA in the last 168 hours. CBC: Recent Labs  Lab 10/21/21 2215 10/21/21 2234 10/22/21 0108 10/23/21 0304  WBC 31.8*  --   --  11.3*  NEUTROABS 25.1*  --   --   --   HGB 17.6* 18.7*   19.0* 16.7 13.4  HCT 53.9* 55.0*   56.0* 49.0 35.9*  MCV 93.9  --   --  82.0  PLT 453*  --   --  247   Cardiac Enzymes: No results for input(s): CKTOTAL, CKMB, CKMBINDEX, TROPONINI in the last 168 hours. BNP (last 3 results) No results for input(s): BNP in the last 8760 hours.  ProBNP (last 3 results) No results for input(s): PROBNP in the last 8760 hours.  CBG: Recent Labs  Lab 10/22/21 1700 10/22/21 2003 10/22/21 2304 10/23/21 0758 10/23/21 1140  GLUCAP 166* 306* 208* 264* 184*       Radiology Reports  DG Chest Portable 1 View  Result Date: 10/21/2021 CLINICAL DATA:  Diabetic ketoacidosis EXAM: PORTABLE CHEST 1 VIEW COMPARISON:  None. FINDINGS: The heart size and mediastinal contours are within normal limits. No focal consolidation. No visible pleural effusion or pneumothorax. The visualized skeletal structures are unremarkable. IMPRESSION: No active disease. Electronically Signed    By: Maudry Mayhew M.D.   On: 10/21/2021 23:15       Antibiotics: Anti-infectives (From admission, onward)    Start     Dose/Rate Route Frequency Ordered Stop   10/23/21 2300  cefTRIAXone (ROCEPHIN) 2 g in sodium chloride 0.9 % 100 mL IVPB        2 g 200 mL/hr over 30 Minutes Intravenous Every 24 hours 10/23/21 1132 10/27/21 2259   10/23/21 1800  azithromycin (ZITHROMAX) tablet 250 mg        250 mg Oral Daily-1800 10/23/21 1132 10/27/21 1759   10/23/21 0200  vancomycin (VANCOREADY) IVPB 1250 mg/250 mL  Status:  Discontinued        1,250 mg 166.7 mL/hr over 90 Minutes Intravenous Every 24 hours 10/22/21 1204 10/22/21 1256   10/22/21 2300  cefTRIAXone (ROCEPHIN) 1 g in sodium chloride 0.9 % 100 mL IVPB  Status:  Discontinued        1 g 200 mL/hr over 30 Minutes Intravenous Every 24 hours 10/22/21 1715 10/23/21 1132   10/22/21 1815  azithromycin (ZITHROMAX) 250 mg in dextrose 5 % 125 mL IVPB  Status:  Discontinued        250 mg 127.5 mL/hr over 60 Minutes Intravenous Every 24 hours 10/22/21 1715 10/23/21 1132   10/22/21 1115  vancomycin (VANCOCIN) IVPB 1000 mg/200 mL premix  Status:  Discontinued       See Hyperspace for full Linked Orders Report.   1,000 mg 200 mL/hr over 60 Minutes Intravenous Every 12 hours 10/21/21 2308 10/21/21 2311   10/22/21 1000  ceFEPIme (MAXIPIME) 2 g in sodium chloride 0.9 % 100 mL IVPB  Status:  Discontinued        2 g 200 mL/hr over 30 Minutes Intravenous Every 12 hours 10/22/21 0322 10/22/21 1256   10/21/21 2315  vancomycin (VANCOREADY) IVPB 1250 mg/250 mL       See Hyperspace for full Linked Orders Report.   1,250 mg 166.7 mL/hr over 90 Minutes Intravenous  Once 10/21/21 2308 10/22/21 0348   10/21/21 2313  vancomycin variable dose per unstable renal function (pharmacist dosing)  Status:  Discontinued         Does not apply See admin instructions 10/21/21 2313 10/22/21 1256   10/21/21 2300  ceFEPIme (MAXIPIME) 2 g in sodium chloride 0.9 % 100 mL IVPB         2 g 200 mL/hr over 30 Minutes Intravenous  Once 10/21/21 2254 10/22/21 0155         DVT prophylaxis: Lovenox  Code Status: Full code  Family Communication: No family at bedside   Consultants: PCCM  Procedures:  Objective    Physical Examination:   General-appears in no acute distress Heart-S1-S2, regular, no murmur auscultated Lungs-clear to auscultation bilaterally, no wheezing or crackles auscultated Abdomen-soft, nontender, no organomegaly Extremities-no edema in the lower extremities Neuro-alert, oriented x3, no focal deficit noted   Status is: Inpatient  Dispo: The patient is from: Home              Anticipated d/c is to: Home              Anticipated d/c date is: 10/24/2021              Patient currently not stable for discharge  Barrier to discharge-ongoing treatment for hyperglycemia, hypokalemia  COVID-19 Labs  No results for input(s): DDIMER, FERRITIN, LDH, CRP in the last 72 hours.  Lab Results  Component Value Date   SARSCOV2NAA NEGATIVE 10/21/2021   SARSCOV2NAA NEGATIVE 05/13/2019            Recent Results (from the past 240 hour(s))  Resp Panel by RT-PCR (Flu A&B, Covid) Nasopharyngeal Swab     Status: None   Collection Time: 10/21/21 10:04 PM   Specimen: Nasopharyngeal Swab; Nasopharyngeal(NP) swabs in vial transport medium  Result Value Ref Range Status   SARS Coronavirus 2 by RT PCR NEGATIVE NEGATIVE Final    Comment: (NOTE) SARS-CoV-2 target nucleic acids are NOT DETECTED.  The SARS-CoV-2 RNA is generally detectable in upper respiratory specimens during the acute phase of infection. The lowest concentration of SARS-CoV-2 viral copies this assay can detect is 138 copies/mL. A negative result does not preclude SARS-Cov-2 infection and should not be used as the sole basis for treatment or other patient management decisions. A negative result may occur with  improper specimen collection/handling, submission of specimen  other than nasopharyngeal swab, presence of viral mutation(s) within the areas targeted by this assay, and inadequate number of viral copies(<138 copies/mL). A negative result must be combined with clinical observations, patient history, and epidemiological information. The expected result is Negative.  Fact Sheet for Patients:  BloggerCourse.com  Fact Sheet for Healthcare Providers:  SeriousBroker.it  This test is no t yet approved or cleared by the [place] FDA and  has been authorized for detection and/or diagnosis of SARS-CoV-2 by FDA under an Emergency Use Authorization (EUA). This EUA will remain  in effect (meaning this test can be used) for the duration of the COVID-19 declaration under Section 564(b)(1) of the Act, 21 U.S.C.section 360bbb-3(b)(1), unless the authorization is terminated  or revoked sooner.       Influenza A by PCR NEGATIVE NEGATIVE Final   Influenza B by PCR NEGATIVE NEGATIVE Final    Comment: (NOTE) The Xpert Xpress SARS-CoV-2/FLU/RSV plus assay is intended as an aid in the diagnosis of influenza from Nasopharyngeal swab specimens and should not be used as a sole basis for treatment. Nasal washings and aspirates are unacceptable for Xpert Xpress SARS-CoV-2/FLU/RSV testing.  Fact Sheet for Patients: BloggerCourse.com  Fact Sheet for Healthcare Providers: SeriousBroker.it  This test is not yet approved or cleared by the [place] FDA and has been authorized for detection and/or diagnosis of SARS-CoV-2 by FDA under an Emergency Use Authorization (EUA). This EUA will remain in effect (meaning this test can be used) for the duration of the COVID-19 declaration under Section 564(b)(1) of the Act, 21 U.S.C. section 360bbb-3(b)(1), unless the authorization is terminated or revoked.  Performed at [facility], [address], [place],  [place] [address]   Respiratory (~20  pathogens) panel by PCR     Status: None   Collection Time: 10/21/21 10:04 PM   Specimen: Nasopharyngeal Swab; Respiratory  Result Value Ref Range Status   Adenovirus NOT DETECTED NOT DETECTED Final   Coronavirus 229E NOT DETECTED NOT DETECTED Final    Comment: (NOTE) The Coronavirus on the Respiratory Panel, DOES NOT test for the novel  Coronavirus (2019 nCoV)    Coronavirus HKU1 NOT DETECTED NOT DETECTED Final   Coronavirus NL63 NOT DETECTED NOT DETECTED Final   Coronavirus OC43 NOT DETECTED NOT DETECTED Final   Metapneumovirus NOT DETECTED NOT DETECTED Final   Rhinovirus / Enterovirus NOT DETECTED NOT DETECTED Final   Influenza A NOT DETECTED NOT DETECTED Final   Influenza B NOT DETECTED NOT DETECTED Final   Parainfluenza Virus 1 NOT DETECTED NOT DETECTED Final   Parainfluenza Virus 2 NOT DETECTED NOT DETECTED Final   Parainfluenza Virus 3 NOT DETECTED NOT DETECTED Final   Parainfluenza Virus 4 NOT DETECTED NOT DETECTED Final   Respiratory Syncytial Virus NOT DETECTED NOT DETECTED Final   Bordetella pertussis NOT DETECTED NOT DETECTED Final   Bordetella Parapertussis NOT DETECTED NOT DETECTED Final   Chlamydophila pneumoniae NOT DETECTED NOT DETECTED Final   Mycoplasma pneumoniae NOT DETECTED NOT DETECTED Final    Comment: Performed at [facility], [address], [place], [place] [address]  Blood culture (routine x 2)     Status: None (Preliminary result)   Collection Time: 10/21/21 10:30 PM   Specimen: BLOOD  Result Value Ref Range Status   Specimen Description BLOOD LEFT ARM UPPER  Final   Special Requests   Final    BOTTLES DRAWN AEROBIC AND ANAEROBIC Blood Culture adequate volume   Culture   Final    NO GROWTH 2 DAYS Performed at [facility], [address], [place], [place] [address]    Report Status PENDING  Incomplete  Blood culture (routine x 2)     Status: None (Preliminary result)   Collection Time: 10/22/21 12:33 AM    Specimen: BLOOD  Result Value Ref Range Status   Specimen Description BLOOD RIGHT ANTECUBITAL  Final   Special Requests   Final    BOTTLES DRAWN AEROBIC AND ANAEROBIC Blood Culture adequate volume   Culture   Final    NO GROWTH 1 DAY Performed at [facility], [address] [place], [place] [address]    Report Status PENDING  Incomplete    Meredeth Ide   [facility] If 7PM-7AM, please contact night-coverage at [email or web address], Office  [phone]   10/23/2021, 2:58 PM  LOS: 1 day

## 2021-10-24 LAB — CBC
HCT: 33.9 % — ABNORMAL LOW (ref 39.0–52.0)
Hemoglobin: 12.1 g/dL — ABNORMAL LOW (ref 13.0–17.0)
MCH: 30.1 pg (ref 26.0–34.0)
MCHC: 35.7 g/dL (ref 30.0–36.0)
MCV: 84.3 fL (ref 80.0–100.0)
Platelets: 211 10*3/uL (ref 150–400)
RBC: 4.02 MIL/uL — ABNORMAL LOW (ref 4.22–5.81)
RDW: 13.2 % (ref 11.5–15.5)
WBC: 7.4 10*3/uL (ref 4.0–10.5)
nRBC: 0 % (ref 0.0–0.2)

## 2021-10-24 LAB — BASIC METABOLIC PANEL
Anion gap: 7 (ref 5–15)
BUN: 9 mg/dL (ref 6–20)
CO2: 29 mmol/L (ref 22–32)
Calcium: 8 mg/dL — ABNORMAL LOW (ref 8.9–10.3)
Chloride: 99 mmol/L (ref 98–111)
Creatinine, Ser: 0.75 mg/dL (ref 0.61–1.24)
GFR, Estimated: >60 mL/min (ref >60)
Glucose, Bld: 413 mg/dL — ABNORMAL HIGH (ref 70–99)
Potassium: 3.3 mmol/L — ABNORMAL LOW (ref 3.5–5.1)
Sodium: 135 mmol/L (ref 135–145)

## 2021-10-24 LAB — GLUCOSE, CAPILLARY
Glucose-Capillary: 286 mg/dL — ABNORMAL HIGH (ref 70–99)
Glucose-Capillary: 179 mg/dL — ABNORMAL HIGH (ref 70–99)

## 2021-10-24 MED ORDER — POTASSIUM CHLORIDE CRYS ER 20 MEQ PO TBCR
40.0000 meq | EXTENDED_RELEASE_TABLET | Freq: Once | ORAL | Status: AC
  Administered 2021-10-24: 40 meq via ORAL
  Filled 2021-10-24: qty 2

## 2021-10-24 NOTE — Plan of Care (Signed)
  Problem: Education: Goal: Knowledge of General Education information will improve Description: Including pain rating scale, medication(s)/side effects and non-pharmacologic comfort measures Outcome: Progressing   Problem: Clinical Measurements: Goal: Will remain free from infection Outcome: Progressing   Problem: Nutrition: Goal: Adequate nutrition will be maintained Outcome: Progressing   Problem: Safety: Goal: Ability to remain free from injury will improve Outcome: Progressing   

## 2021-10-24 NOTE — Discharge Summary (Addendum)
Physician Discharge Summary  Kevin Russell QBH:[id number] DOB: 09/13/1986 DOA: 10/21/2021  PCP: Grayce Sessions, NP  Admit date: 10/21/2021 Discharge date: 10/24/2021  Time spent: 60 minutes  Recommendations for Outpatient Follow-up:  Follow-up PCP in 1 week  Discharge Diagnoses:  Principal Problem:   DKA (diabetic ketoacidosis) (HCC) Active Problems:   Leukocytosis   Erythrocytosis   Thrombocytosis   AKI (acute kidney injury) (HCC)   Hypocalcemia   Diabetic ketoacidosis (HCC)   Discharge Condition: Stable  Diet recommendation: Carb modified diet  Filed Weights   10/21/21 2240  Weight: 63.5 kg    History of present illness:  35 year old male with history of diabetes mellitus type 1 who was admitted with DKA after 2-day history of nausea vomiting and not taking insulin.  Empirically was started on antibiotics.  He was started on IV insulin which has been transitioned to subcu insulin.    Hospital Course:   Diabetic ketoacidosis -Resolved -IV insulin has been transitioned to subcu insulin -Patient's blood glucose is well controlled  Diabetes mellitus type 1 -Patient is supposed to take insulin 70/30 15 and subcu twice daily at home -He only takes once a day -Counseled to take medication as prescribed twice a day -Follow-up PCP in 1 week   Leukocytosis -Likely reactive, WBC was 31,000 on 12/28, improved to 7000 today -He was empirically started on Zithromax and ceftriaxone -No clear signs and symptoms of infection -Chest x-ray is clear, patient is afebrile -We will discontinue antibiotics   Acute kidney injury -Resolved with IV fluids   Hypokalemia -Potassium 3.3 this morning -We will give K-Dur 40 meq p.o. x1 before discharge  Procedures:   Consultations: PCCM  Discharge Exam: Vitals:   10/24/21 0739 10/24/21 1112  BP: 105/77 101/64  Pulse: 97 91  Resp: 17 14  Temp: 98.1 F (36.7 C)   SpO2: 97% 98%    General: Appears in no acute  distress Abdomen-soft, no organomegaly Extremities-no edema noted  Discharge Instructions   Discharge Instructions     Diet Carb Modified   Complete by: As directed    Increase activity slowly   Complete by: As directed       Allergies as of 10/24/2021   No Known Allergies      Medication List     TAKE these medications    fluvoxaMINE 100 MG tablet Commonly known as: LUVOX Take 1 tablet (100 mg total) by mouth daily.   insulin aspart protamine- aspart (70-30) 100 UNIT/ML injection Commonly known as: NOVOLOG MIX 70/30 Inject 0.2 mLs (20 Units total) into the skin 2 (two) times daily with a meal. What changed:  how much to take when to take this       No Known Allergies    The results of significant diagnostics from this hospitalization (including imaging, microbiology, ancillary and laboratory) are listed below for reference.    Significant Diagnostic Studies: DG Chest Portable 1 View  Result Date: 10/21/2021 CLINICAL DATA:  Diabetic ketoacidosis EXAM: PORTABLE CHEST 1 VIEW COMPARISON:  None. FINDINGS: The heart size and mediastinal contours are within normal limits. No focal consolidation. No visible pleural effusion or pneumothorax. The visualized skeletal structures are unremarkable. IMPRESSION: No active disease. Electronically Signed   By: Maudry Mayhew M.D.   On: 10/21/2021 23:15    Microbiology: Recent Results (from the past 240 hour(s))  Resp Panel by RT-PCR (Flu A&B, Covid) Nasopharyngeal Swab     Status: None   Collection Time: 10/21/21 10:04 PM  Specimen: Nasopharyngeal Swab; Nasopharyngeal(NP) swabs in vial transport medium  Result Value Ref Range Status   SARS Coronavirus 2 by RT PCR NEGATIVE NEGATIVE Final    Comment: (NOTE) SARS-CoV-2 target nucleic acids are NOT DETECTED.  The SARS-CoV-2 RNA is generally detectable in upper respiratory specimens during the acute phase of infection. The lowest concentration of SARS-CoV-2 viral copies  this assay can detect is 138 copies/mL. A negative result does not preclude SARS-Cov-2 infection and should not be used as the sole basis for treatment or other patient management decisions. A negative result may occur with  improper specimen collection/handling, submission of specimen other than nasopharyngeal swab, presence of viral mutation(s) within the areas targeted by this assay, and inadequate number of viral copies(<138 copies/mL). A negative result must be combined with clinical observations, patient history, and epidemiological information. The expected result is Negative.  Fact Sheet for Patients:  BloggerCourse.com  Fact Sheet for Healthcare Providers:  SeriousBroker.it  This test is no t yet approved or cleared by the [place] FDA and  has been authorized for detection and/or diagnosis of SARS-CoV-2 by FDA under an Emergency Use Authorization (EUA). This EUA will remain  in effect (meaning this test can be used) for the duration of the COVID-19 declaration under Section 564(b)(1) of the Act, 21 U.S.C.section 360bbb-3(b)(1), unless the authorization is terminated  or revoked sooner.       Influenza A by PCR NEGATIVE NEGATIVE Final   Influenza B by PCR NEGATIVE NEGATIVE Final    Comment: (NOTE) The Xpert Xpress SARS-CoV-2/FLU/RSV plus assay is intended as an aid in the diagnosis of influenza from Nasopharyngeal swab specimens and should not be used as a sole basis for treatment. Nasal washings and aspirates are unacceptable for Xpert Xpress SARS-CoV-2/FLU/RSV testing.  Fact Sheet for Patients: BloggerCourse.com  Fact Sheet for Healthcare Providers: SeriousBroker.it  This test is not yet approved or cleared by the [place] FDA and has been authorized for detection and/or diagnosis of SARS-CoV-2 by FDA under an Emergency Use Authorization (EUA). This EUA  will remain in effect (meaning this test can be used) for the duration of the COVID-19 declaration under Section 564(b)(1) of the Act, 21 U.S.C. section 360bbb-3(b)(1), unless the authorization is terminated or revoked.  Performed at [facility], [address] [place], [place] [address]   Respiratory (~20 pathogens) panel by PCR     Status: None   Collection Time: 10/21/21 10:04 PM   Specimen: Nasopharyngeal Swab; Respiratory  Result Value Ref Range Status   Adenovirus NOT DETECTED NOT DETECTED Final   Coronavirus 229E NOT DETECTED NOT DETECTED Final    Comment: (NOTE) The Coronavirus on the Respiratory Panel, DOES NOT test for the novel  Coronavirus (2019 nCoV)    Coronavirus HKU1 NOT DETECTED NOT DETECTED Final   Coronavirus NL63 NOT DETECTED NOT DETECTED Final   Coronavirus OC43 NOT DETECTED NOT DETECTED Final   Metapneumovirus NOT DETECTED NOT DETECTED Final   Rhinovirus / Enterovirus NOT DETECTED NOT DETECTED Final   Influenza A NOT DETECTED NOT DETECTED Final   Influenza B NOT DETECTED NOT DETECTED Final   Parainfluenza Virus 1 NOT DETECTED NOT DETECTED Final   Parainfluenza Virus 2 NOT DETECTED NOT DETECTED Final   Parainfluenza Virus 3 NOT DETECTED NOT DETECTED Final   Parainfluenza Virus 4 NOT DETECTED NOT DETECTED Final   Respiratory Syncytial Virus NOT DETECTED NOT DETECTED Final   Bordetella pertussis NOT DETECTED NOT DETECTED Final   Bordetella Parapertussis NOT DETECTED  NOT DETECTED Final   Chlamydophila pneumoniae NOT DETECTED NOT DETECTED Final   Mycoplasma pneumoniae NOT DETECTED NOT DETECTED Final    Comment: Performed at [facility], [address] [place], [place] [address]  Blood culture (routine x 2)     Status: None (Preliminary result)   Collection Time: 10/21/21 10:30 PM   Specimen: BLOOD  Result Value Ref Range Status   Specimen Description BLOOD LEFT ARM UPPER  Final   Special Requests   Final    BOTTLES DRAWN AEROBIC AND ANAEROBIC  Blood Culture adequate volume   Culture   Final    NO GROWTH 3 DAYS Performed at [facility], [address], [place], [place] [address]    Report Status PENDING  Incomplete  Blood culture (routine x 2)     Status: None (Preliminary result)   Collection Time: 10/22/21 12:33 AM   Specimen: BLOOD  Result Value Ref Range Status   Specimen Description BLOOD RIGHT ANTECUBITAL  Final   Special Requests   Final    BOTTLES DRAWN AEROBIC AND ANAEROBIC Blood Culture adequate volume   Culture   Final    NO GROWTH 2 DAYS Performed at [facility], [address], [place], [place] [address]    Report Status PENDING  Incomplete     Labs: Basic Metabolic Panel: Recent Labs  Lab 10/22/21 0309 10/22/21 0725 10/22/21 1150 10/22/21 1802 10/23/21 0304 10/24/21 0222  NA 128* 127* 132* 134* 136 135  K 4.3 3.8 3.8 3.5 3.2* 3.3*  CL 98 100 106 108 107 99  CO2 10* 13* 18* 22 23 29   GLUCOSE 260* 232* 214* 209* 194* 413*  BUN 23* 19 16 13 9 9   CREATININE 1.78* 1.48* 1.12 0.93 0.82 0.75  CALCIUM 7.7* 7.8* 8.1* 8.3* 8.3* 8.0*  MG 1.5*  --   --   --  1.9  --    Liver Function Tests: Recent Labs  Lab 10/21/21 2215  AST 33  ALT 26  ALKPHOS 142*  BILITOT 1.8*  PROT 7.9  ALBUMIN 4.4   No results for input(s): LIPASE, AMYLASE in the last 168 hours. No results for input(s): AMMONIA in the last 168 hours. CBC: Recent Labs  Lab 10/21/21 2215 10/21/21 2234 10/22/21 0108 10/23/21 0304 10/24/21 0222  WBC 31.8*  --   --  11.3* 7.4  NEUTROABS 25.1*  --   --   --   --   HGB 17.6* 18.7*   19.0* 16.7 13.4 12.1*  HCT 53.9* 55.0*   56.0* 49.0 35.9* 33.9*  MCV 93.9  --   --  82.0 84.3  PLT 453*  --   --  247 211   Cardiac Enzymes: No results for input(s): CKTOTAL, CKMB, CKMBINDEX, TROPONINI in the last 168 hours. BNP: BNP (last 3 results) No results for input(s): BNP in the last 8760 hours.  ProBNP (last 3 results) No results for input(s): PROBNP in the last 8760  hours.  CBG: Recent Labs  Lab 10/23/21 1140 10/23/21 1607 10/23/21 1947 10/24/21 0743 10/24/21 1110  GLUCAP 184* 197* 265* 286* 179*       Signed:  10/26/21 MD.  [facility] 10/24/2021, 11:34 AM

## 2021-10-24 NOTE — TOC Transition Note (Signed)
Transition of Care [facility]) - CM/SW Discharge Note   Patient Details  Name: Kevin Russell MRN: [id number] Date of Birth: [birth year]  [facility] CM/SW Contact:  Bess Kinds, RN Phone Number: [phone] 10/24/2021, 12:29 PM   Clinical Narrative:     Patient to transition home today. Previous CM arranged for hospital f/u appointment at [facility] and [facility] letter for medication assistance. No further TOC needs identified at this time.   Final next level of care: Home/Self Care Barriers to Discharge: No Barriers Identified   Patient Goals and CMS Choice Patient states their goals for this hospitalization and ongoing recovery are:: to return home CMS Medicare.gov Compare Post Acute Care list provided to:: Patient    Discharge Placement                       Discharge Plan and Services In-house Referral: Financial Counselor Discharge Planning Services: CM Consult            DME Arranged: N/A DME Agency: NA       HH Arranged: NA          Social Determinants of Health (SDOH) Interventions     Readmission Risk Interventions No flowsheet data found.

## 2021-10-26 LAB — CULTURE, BLOOD (ROUTINE X 2)
Culture: NO GROWTH
Special Requests: ADEQUATE

## 2021-10-27 ENCOUNTER — Telehealth: Payer: Self-pay

## 2021-10-27 LAB — CULTURE, BLOOD (ROUTINE X 2)
Culture: NO GROWTH
Special Requests: ADEQUATE

## 2021-10-27 NOTE — Telephone Encounter (Signed)
Transition Care Management Follow-up Telephone Call Date of discharge and from where: 10/24/2021, [facility] How have you been since you were released from the hospital? He said he is good.  Any questions or concerns? No  Items Reviewed: Did the pt receive and understand the discharge instructions provided? Yes  Medications obtained and verified? Yes - he said he has both medications and didn't have any questions about the med regime. He has a glucometer but has not checked his blood sugar this morning.  Other? No  Any new allergies since your discharge? No  Dietary orders reviewed? Yes Do you have support at home? Yes   Home Care and Equipment/Supplies: Were home health services ordered? no If so, what is the name of the agency? N/a  Has the agency set up a time to come to the patient's home? not applicable Were any new equipment or medical supplies ordered?  No What is the name of the medical supply agency? N/a Were you able to get the supplies/equipment? not applicable Do you have any questions related to the use of the equipment or supplies? No  Functional Questionnaire: (I = Independent and D = Dependent) ADLs: independent  Follow up appointments reviewed:  PCP Hospital f/u appt confirmed? Yes  Scheduled to see Gwinda Passe, NP on 11/23/2021.  He said he did not want to schedule an appointment to be seen sooner but he has the phone number for [facility] and will call if he wants to be seen sooner.  Specialist Hospital f/u appt confirmed?  None scheduled at this time.    Are transportation arrangements needed? No  If their condition worsens, is the pt aware to call PCP or go to the Emergency Dept.? Yes Was the patient provided with contact information for the PCP's office or ED? Yes Was to pt encouraged to call back with questions or concerns? Yes

## 2021-11-23 ENCOUNTER — Inpatient Hospital Stay (INDEPENDENT_AMBULATORY_CARE_PROVIDER_SITE_OTHER): Payer: Self-pay | Admitting: Primary Care

## 2022-01-19 ENCOUNTER — Emergency Department (HOSPITAL_COMMUNITY)
Admission: EM | Admit: 2022-01-19 | Discharge: 2022-01-19 | Disposition: A | Discharge status: Home / Self Care | Payer: Self-pay | Attending: Emergency Medicine | Admitting: Emergency Medicine

## 2022-01-19 ENCOUNTER — Encounter (HOSPITAL_COMMUNITY): Payer: Self-pay | Admitting: Pharmacy Technician

## 2022-01-19 ENCOUNTER — Other Ambulatory Visit: Payer: Self-pay

## 2022-01-19 DIAGNOSIS — E1165 Type 2 diabetes mellitus with hyperglycemia: Secondary | ICD-10-CM | POA: Insufficient documentation

## 2022-01-19 DIAGNOSIS — M79662 Pain in left lower leg: Secondary | ICD-10-CM | POA: Insufficient documentation

## 2022-01-19 DIAGNOSIS — R739 Hyperglycemia, unspecified: Secondary | ICD-10-CM

## 2022-01-19 DIAGNOSIS — M79661 Pain in right lower leg: Secondary | ICD-10-CM | POA: Insufficient documentation

## 2022-01-19 DIAGNOSIS — M79604 Pain in right leg: Secondary | ICD-10-CM

## 2022-01-19 DIAGNOSIS — Z794 Long term (current) use of insulin: Secondary | ICD-10-CM | POA: Insufficient documentation

## 2022-01-19 LAB — CBC
HCT: 45.4 % (ref 39.0–52.0)
Hemoglobin: 15.3 g/dL (ref 13.0–17.0)
MCH: 29.1 pg (ref 26.0–34.0)
MCHC: 33.7 g/dL (ref 30.0–36.0)
MCV: 86.5 fL (ref 80.0–100.0)
Platelets: 339 10*3/uL (ref 150–400)
RBC: 5.25 MIL/uL (ref 4.22–5.81)
RDW: 12.3 % (ref 11.5–15.5)
WBC: 6.1 10*3/uL (ref 4.0–10.5)
nRBC: 0 % (ref 0.0–0.2)

## 2022-01-19 LAB — URINALYSIS, ROUTINE W REFLEX MICROSCOPIC
Bilirubin Urine: NEGATIVE
Glucose, UA: >=500 mg/dL — AB
Hgb urine dipstick: NEGATIVE
Ketones, ur: 20 mg/dL — AB
Leukocytes,Ua: NEGATIVE
Nitrite: NEGATIVE
Protein, ur: NEGATIVE mg/dL
Specific Gravity, Urine: 1.031 — ABNORMAL HIGH (ref 1.005–1.030)
pH: 5 (ref 5.0–8.0)

## 2022-01-19 LAB — BASIC METABOLIC PANEL
Anion gap: 9 (ref 5–15)
BUN: 16 mg/dL (ref 6–20)
CO2: 26 mmol/L (ref 22–32)
Calcium: 9.5 mg/dL (ref 8.9–10.3)
Chloride: 97 mmol/L — ABNORMAL LOW (ref 98–111)
Creatinine, Ser: 0.88 mg/dL (ref 0.61–1.24)
GFR, Estimated: >60 mL/min (ref >60)
Glucose, Bld: 507 mg/dL (ref 70–99)
Potassium: 5.3 mmol/L — ABNORMAL HIGH (ref 3.5–5.1)
Sodium: 132 mmol/L — ABNORMAL LOW (ref 135–145)

## 2022-01-19 LAB — CBG MONITORING, ED
Glucose-Capillary: 576 mg/dL (ref 70–99)
Glucose-Capillary: 407 mg/dL — ABNORMAL HIGH (ref 70–99)

## 2022-01-19 LAB — I-STAT VENOUS BLOOD GAS, ED
Acid-Base Excess: 2 mmol/L (ref 0.0–2.0)
Bicarbonate: 28 mmol/L (ref 20.0–28.0)
Calcium, Ion: 1.17 mmol/L (ref 1.15–1.40)
HCT: 46 % (ref 39.0–52.0)
Hemoglobin: 15.6 g/dL (ref 13.0–17.0)
O2 Saturation: 65 %
Potassium: 5.3 mmol/L — ABNORMAL HIGH (ref 3.5–5.1)
Sodium: 132 mmol/L — ABNORMAL LOW (ref 135–145)
TCO2: 29 mmol/L (ref 22–32)
pCO2, Ven: 49.7 mmHg (ref 44–60)
pH, Ven: 7.359 (ref 7.25–7.43)
pO2, Ven: 36 mmHg (ref 32–45)

## 2022-01-19 MED ORDER — INSULIN ASPART 100 UNIT/ML IJ SOLN
10.0000 [IU] | Freq: Once | INTRAMUSCULAR | Status: AC
  Administered 2022-01-19: 10 [IU] via INTRAVENOUS

## 2022-01-19 MED ORDER — GABAPENTIN 100 MG PO CAPS: 100.0000 mg | ORAL_CAPSULE | Freq: Three times a day (TID) | ORAL | 0 refills | Status: DC

## 2022-01-19 MED ORDER — GABAPENTIN 100 MG PO CAPS
100.0000 mg | ORAL_CAPSULE | Freq: Three times a day (TID) | ORAL | Status: DC
  Administered 2022-01-19: 100 mg via ORAL
  Filled 2022-01-19: qty 1

## 2022-01-19 MED ORDER — SODIUM CHLORIDE 0.9 % IV BOLUS
1000.0000 mL | Freq: Once | INTRAVENOUS | Status: AC
Start: 2022-01-19 — End: 2022-01-19
  Administered 2022-01-19: 1000 mL via INTRAVENOUS

## 2022-01-19 MED ORDER — INSULIN ASPART PROT & ASPART (70-30 MIX) 100 UNIT/ML ~~LOC~~ SUSP: 15.0000 [IU] | Freq: Every day | SUBCUTANEOUS | 3 refills | Status: DC

## 2022-01-19 NOTE — ED Provider Triage Note (Signed)
Emergency Medicine Provider Triage Evaluation Note ? ?Kevin Russell , a 36 y.o. male  was evaluated in triage.  Pt complains of bilateral feet burning sensation that is worsened over the last 2 months since his last admission for DKA.  Additionally patient with elevated blood glucose on arrival today.  He reports that he has been taking his insulin as prescribed twice daily, reports he ate some ice cream with his daughter last night but no other dietary changes.  Does not feel otherwise sick, denies chest pain, shortness of breath, fever, chills, cough. ? ?Review of Systems  ?Positive: Feet pain, tingling, elevated blood sugar ?Negative: Chest pain, shob ? ?Physical Exam  ?BP 110/76   Pulse (!) 116   Temp 98.8 ?F (37.1 ?C)   Resp 19   SpO2 100%  ?Gen:   Awake, no distress   ?Resp:  Normal effort  ?MSK:   Moves extremities without difficulty  ?Other:  Intact DP/PT pulse bilaterally  ? ?Medical Decision Making  ?Medically screening exam initiated at 11:31 AM.  Appropriate orders placed.  Kevin Russell was informed that the remainder of the evaluation will be completed by another provider, this initial triage assessment does not replace that evaluation, and the importance of remaining in the ED until their evaluation is complete. ? ?Workup initiated ?  ?Olene Floss, PA-C ?01/19/22 1133 ? ?

## 2022-01-19 NOTE — ED Triage Notes (Signed)
Pt here with reports of bil burning/tingling to feet. Pt with hx DM 1. States the last time his feet felt this he was in DKA.  ?

## 2022-01-19 NOTE — ED Provider Notes (Signed)
?[facility] EMERGENCY DEPARTMENT ?Provider Note ? ? ?CSN: [id number] ?Arrival date & time: 01/19/22  1015 ? ?  ? ?History ? ?Chief Complaint  ?Patient presents with  ? Foot Pain  ? Hyperglycemia  ? ? ?Kevin Russell is a 36 y.o. male. ? ?HPI ?Patient presents with burning, tingling in both feet.  He has a history of insulin-dependent diabetes, denies other medical problems.  He did obtain his medication at [facility], notes that he has been taking his medication regularly, but recently ran out. ?No fever, chills, complete lack of sensation.  He notes that he took over-the-counter pain medication without relief. ?  ? ?Home Medications ?Prior to Admission medications   ?Medication Sig Start Date End Date Taking? Authorizing Provider  ?insulin aspart protamine- aspart (NOVOLOG MIX 70/30) (70-30) 100 UNIT/ML injection Inject 0.2 mLs (20 Units total) into the skin 2 (two) times daily with a meal. ?Patient taking differently: Inject 15 Units into the skin daily. 08/23/18  Yes Pucilowska, Jolanta B, MD  ?fluvoxaMINE (LUVOX) 100 MG tablet Take 1 tablet (100 mg total) by mouth daily. ?Patient not taking: Reported on 10/22/2021 05/18/19   Aldean Baker, NP  ?   ? ?Allergies    ?Patient has no known allergies.   ? ?Review of Systems   ?Review of Systems  ?Constitutional:   ?     Per HPI, otherwise negative  ?HENT:    ?     Per HPI, otherwise negative  ?Respiratory:    ?     Per HPI, otherwise negative  ?Cardiovascular:   ?     Per HPI, otherwise negative  ?Gastrointestinal:  Negative for vomiting.  ?Endocrine:  ?     Negative aside from HPI  ?Genitourinary:   ?     Neg aside from HPI   ?Musculoskeletal:   ?     Per HPI, otherwise negative  ?Skin: Negative.   ?Neurological:  Negative for syncope.  ? ?Physical Exam ?Updated Vital Signs ?BP 105/74 (BP Location: Right Arm)   Pulse 98   Temp 98.8 ?F (37.1 ?C)   Resp 15   SpO2 97%  ?Physical Exam ?Vitals and nursing note reviewed.  ?Constitutional:   ?   General:  He is not in acute distress. ?   Appearance: He is well-developed.  ?HENT:  ?   Head: Normocephalic and atraumatic.  ?Eyes:  ?   Conjunctiva/sclera: Conjunctivae normal.  ?Cardiovascular:  ?   Rate and Rhythm: Normal rate and regular rhythm.  ?Pulmonary:  ?   Effort: Pulmonary effort is normal. No respiratory distress.  ?   Breath sounds: No stridor.  ?Abdominal:  ?   General: There is no distension.  ?Skin: ?   General: Skin is warm and dry.  ?Neurological:  ?   Mental Status: He is alert and oriented to person, place, and time.  ?   Cranial Nerves: No cranial nerve deficit.  ?   Sensory: No sensory deficit.  ?   Motor: No weakness.  ?   Coordination: Coordination normal.  ?   Comments: Sensation in lower extremities grossly intact  ? ? ?ED Results / Procedures / Treatments   ?Labs ?(all labs ordered are listed, but only abnormal results are displayed) ?Labs Reviewed  ?BASIC METABOLIC PANEL - Abnormal; Notable for the following components:  ?    Result Value  ? Sodium 132 (*)   ? Potassium 5.3 (*)   ? Chloride 97 (*)   ?  Glucose, Bld 507 (*)   ? All other components within normal limits  ?URINALYSIS, ROUTINE W REFLEX MICROSCOPIC - Abnormal; Notable for the following components:  ? Color, Urine STRAW (*)   ? Specific Gravity, Urine 1.031 (*)   ? Glucose, UA >=500 (*)   ? Ketones, ur 20 (*)   ? Bacteria, UA RARE (*)   ? All other components within normal limits  ?CBG MONITORING, ED - Abnormal; Notable for the following components:  ? Glucose-Capillary 576 (*)   ? All other components within normal limits  ?CBG MONITORING, ED - Abnormal; Notable for the following components:  ? Glucose-Capillary 407 (*)   ? All other components within normal limits  ?I-STAT VENOUS BLOOD GAS, ED - Abnormal; Notable for the following components:  ? Sodium 132 (*)   ? Potassium 5.3 (*)   ? All other components within normal limits  ?CBC  ?CBG MONITORING, ED  ? ? ?EKG ?None ? ?Radiology ?No results found. ? ?Procedures ?Procedures   ? ? ?Medications Ordered in ED ?Medications  ?gabapentin (NEURONTIN) capsule 100 mg (100 mg Oral Given 01/19/22 1628)  ?sodium chloride 0.9 % bolus 1,000 mL (0 mLs Intravenous Stopped 01/19/22 1628)  ?insulin aspart (novoLOG) injection 10 Units (10 Units Intravenous Given 01/19/22 1522)  ? ? ?ED Course/ Medical Decision Making/ A&P ?This patient with a Hx of insulin-dependent diabetes, presents to the ED for concern of bilateral lower extremity pain, this involves an extensive number of treatment options, and is a complaint that carries with it a high risk of complications and morbidity.   ? ?The differential diagnosis includes neuropathy, DKA, nonketotic hyperosmolar state, bacteremia, sepsis, infection ? ? ?Social Determinants of Health: ? ?Transient status, no medical insurance ? ?Additional history obtained: ? ?Additional history and/or information obtained from chart review, notable for prior episodes of DKA ? ? ?After the initial evaluation, orders, including: Labs, fluids were initiated. ? ? ?Patient placed on Cardiac and Pulse-Oximetry Monitors. ?The patient was maintained on a cardiac monitor.  The cardiac monitored showed an rhythm of 95 sinus rhythm normal ?The patient was also maintained on pulse oximetry. The readings were typically 100% room air normal ? ? ?On repeat evaluation of the patient improved ? ?Lab Tests: ? ?I personally interpreted labs.  The pertinent results include: Hyperglycemia, ketosis, but no acidosis ? ? ?Consultations Obtained: ? ?I requested consultation with the social work,   ? ?Dispostion / Final MDM: ? ?After consideration of the diagnostic results and the patient's response to treatment, on repeat exam the patient's glucose has improved substantially has received fluids, insulin IV, is in no distress.  With a lengthy conversation about likely neuropathy, patient is amenable to starting gabapentin.  I attempted to enlist the help of our social work colleagues for facilitating an  outpatient visit, but the patient will receive this follow-up information, prescription for meds, with no evidence for DKA, nonketotic hyperosmolar state, bacteremia, sepsis, CNS dysfunction he is discharged in stable condition. ? ?Final Clinical Impression(s) / ED Diagnoses ?Final diagnoses:  ?Hyperglycemia  ?Pain in both lower extremities  ? ? ?Rx / DC Orders ?ED Discharge Orders   ? ?      Ordered  ?  gabapentin (NEURONTIN) 100 MG capsule  3 times daily       ? 01/19/22 1659  ?  insulin aspart protamine- aspart (NOVOLOG MIX 70/30) (70-30) 100 UNIT/ML injection  Daily       ? 01/19/22 1700  ? ?  ?  ? ?  ? ? ?  ?  Gerhard MunchLockwood, Deivi Huckins, MD ?01/19/22 1700 ? ?

## 2022-01-19 NOTE — Discharge Instructions (Signed)
As discussed, you should obtain follow-up at our [facility].  They can help you obtain your medications, ensure that you have access to these and evaluate what medical assistance you are eligible for.  Return here for concerning changes in your condition. ?

## 2022-02-05 ENCOUNTER — Other Ambulatory Visit: Payer: Self-pay

## 2022-02-05 ENCOUNTER — Emergency Department (HOSPITAL_COMMUNITY)
Admission: EM | Admit: 2022-02-05 | Discharge: 2022-02-05 | Disposition: A | Discharge status: Home / Self Care | Payer: Self-pay | Attending: Emergency Medicine | Admitting: Emergency Medicine

## 2022-02-05 ENCOUNTER — Encounter (HOSPITAL_COMMUNITY): Payer: Self-pay

## 2022-02-05 DIAGNOSIS — M25571 Pain in right ankle and joints of right foot: Secondary | ICD-10-CM | POA: Insufficient documentation

## 2022-02-05 DIAGNOSIS — Z760 Encounter for issue of repeat prescription: Secondary | ICD-10-CM | POA: Insufficient documentation

## 2022-02-05 DIAGNOSIS — Z794 Long term (current) use of insulin: Secondary | ICD-10-CM | POA: Insufficient documentation

## 2022-02-05 DIAGNOSIS — M25572 Pain in left ankle and joints of left foot: Secondary | ICD-10-CM | POA: Insufficient documentation

## 2022-02-05 DIAGNOSIS — E109 Type 1 diabetes mellitus without complications: Secondary | ICD-10-CM | POA: Insufficient documentation

## 2022-02-05 LAB — BASIC METABOLIC PANEL
Anion gap: 7 (ref 5–15)
BUN: 8 mg/dL (ref 6–20)
CO2: 27 mmol/L (ref 22–32)
Calcium: 8.8 mg/dL — ABNORMAL LOW (ref 8.9–10.3)
Chloride: 98 mmol/L (ref 98–111)
Creatinine, Ser: 0.74 mg/dL (ref 0.61–1.24)
GFR, Estimated: >60 mL/min (ref >60)
Glucose, Bld: 409 mg/dL — ABNORMAL HIGH (ref 70–99)
Potassium: 3.9 mmol/L (ref 3.5–5.1)
Sodium: 132 mmol/L — ABNORMAL LOW (ref 135–145)

## 2022-02-05 LAB — I-STAT VENOUS BLOOD GAS, ED
Acid-Base Excess: 4 mmol/L — ABNORMAL HIGH (ref 0.0–2.0)
Bicarbonate: 31.1 mmol/L — ABNORMAL HIGH (ref 20.0–28.0)
Calcium, Ion: 1.16 mmol/L (ref 1.15–1.40)
HCT: 38 % — ABNORMAL LOW (ref 39.0–52.0)
Hemoglobin: 12.9 g/dL — ABNORMAL LOW (ref 13.0–17.0)
O2 Saturation: 99 %
Potassium: 4.2 mmol/L (ref 3.5–5.1)
Sodium: 135 mmol/L (ref 135–145)
TCO2: 33 mmol/L — ABNORMAL HIGH (ref 22–32)
pCO2, Ven: 54.7 mmHg (ref 44–60)
pH, Ven: 7.363 (ref 7.25–7.43)
pO2, Ven: 166 mmHg — ABNORMAL HIGH (ref 32–45)

## 2022-02-05 LAB — CBC WITH DIFFERENTIAL/PLATELET
Abs Immature Granulocytes: 0.02 10*3/uL (ref 0.00–0.07)
Basophils Absolute: 0 10*3/uL (ref 0.0–0.1)
Basophils Relative: 1 %
Eosinophils Absolute: 0.1 10*3/uL (ref 0.0–0.5)
Eosinophils Relative: 2 %
HCT: 37.8 % — ABNORMAL LOW (ref 39.0–52.0)
Hemoglobin: 13 g/dL (ref 13.0–17.0)
Immature Granulocytes: 0 %
Lymphocytes Relative: 45 %
Lymphs Abs: 2.7 10*3/uL (ref 0.7–4.0)
MCH: 29.3 pg (ref 26.0–34.0)
MCHC: 34.4 g/dL (ref 30.0–36.0)
MCV: 85.1 fL (ref 80.0–100.0)
Monocytes Absolute: 0.6 10*3/uL (ref 0.1–1.0)
Monocytes Relative: 10 %
Neutro Abs: 2.6 10*3/uL (ref 1.7–7.7)
Neutrophils Relative %: 42 %
Platelets: 250 10*3/uL (ref 150–400)
RBC: 4.44 MIL/uL (ref 4.22–5.81)
RDW: 12.4 % (ref 11.5–15.5)
WBC: 6.1 10*3/uL (ref 4.0–10.5)
nRBC: 0 % (ref 0.0–0.2)

## 2022-02-05 LAB — CBG MONITORING, ED: Glucose-Capillary: 373 mg/dL — ABNORMAL HIGH (ref 70–99)

## 2022-02-05 MED ORDER — GABAPENTIN 100 MG PO CAPS: 100.0000 mg | ORAL_CAPSULE | Freq: Three times a day (TID) | ORAL | 1 refills | Status: DC

## 2022-02-05 NOTE — ED Provider Notes (Signed)
?[facility] EMERGENCY DEPARTMENT ?Provider Note ? ? ?CSN: [id number] ?Arrival date & time: 02/05/22  0147 ? ?  ? ?History ? ?Chief Complaint  ?Patient presents with  ? Foot Pain  ?Kevin Russell is a  36 year old male  who presents today for bilateral foot pain. Pt's PMH include Type 1 DM and peripheral neuropathy. Pt states that he came in to the ER last month for "burning and tingling in both feet" and was given Gabapentin which has helped the pain. He states that he has ran out of the Gabapentin since he was given "30 pills and the bottle says to take 3 times a day" and that the pain has returned especially at night. He states at night his pain is a 9, but throughout the day his pain is fine. Denies chest pain, SOB, numbness in one extremity more than the other, blurry vision, trouble walking, loss of balance, lack of coordination, fever, or difference of color of skin in either leg. Patient made a pcp appointment but cannot get in until August. He would like a refill. ? ? ?Foot Pain ? ? ?  ? ?Home Medications ?Prior to Admission medications   ?Medication Sig Start Date End Date Taking? Authorizing Provider  ?insulin aspart protamine- aspart (NOVOLOG MIX 70/30) (70-30) 100 UNIT/ML injection Inject 0.15 mLs (15 Units total) into the skin daily. ?Patient taking differently: Inject 15 Units into the skin 2 (two) times daily. 01/19/22  Yes Gerhard Munch, MD  ?fluvoxaMINE (LUVOX) 100 MG tablet Take 1 tablet (100 mg total) by mouth daily. ?Patient not taking: Reported on 10/22/2021 05/18/19   Aldean Baker, NP  ?gabapentin (NEURONTIN) 100 MG capsule Take 1 capsule (100 mg total) by mouth 3 (three) times daily. 02/05/22   Arthor Captain, PA-C  ?   ? ?Allergies    ?Patient has no known allergies.   ? ?Review of Systems   ?Review of Systems ? ?Physical Exam ?Updated Vital Signs ?BP 113/82 (BP Location: Right Arm)   Pulse 91   Temp 98 ?F (36.7 ?C) (Oral)   Resp 14   SpO2 99%  ?Physical Exam ?Vitals  and nursing note reviewed.  ?Constitutional:   ?   General: He is not in acute distress. ?   Appearance: He is well-developed. He is not diaphoretic.  ?HENT:  ?   Head: Normocephalic and atraumatic.  ?Eyes:  ?   General: No scleral icterus. ?   Conjunctiva/sclera: Conjunctivae normal.  ?Cardiovascular:  ?   Rate and Rhythm: Normal rate and regular rhythm.  ?   Pulses:     ?     Dorsalis pedis pulses are 2+ on the right side and 2+ on the left side.  ?     Posterior tibial pulses are 2+ on the right side and 2+ on the left side.  ?   Heart sounds: Normal heart sounds.  ?Pulmonary:  ?   Effort: Pulmonary effort is normal. No respiratory distress.  ?   Breath sounds: Normal breath sounds.  ?Abdominal:  ?   Palpations: Abdomen is soft.  ?   Tenderness: There is no abdominal tenderness.  ?Musculoskeletal:  ?   Cervical back: Normal range of motion and neck supple.  ?   Right foot: Normal range of motion. No deformity.  ?   Left foot: Normal range of motion. No deformity.  ?Feet:  ?   Right foot:  ?   Skin integrity: Skin integrity normal.  ?  Left foot:  ?   Skin integrity: Skin integrity normal.  ?Skin: ?   General: Skin is warm and dry.  ?Neurological:  ?   Mental Status: He is alert.  ?Psychiatric:     ?   Behavior: Behavior normal.  ? ? ?ED Results / Procedures / Treatments   ?Labs ?(all labs ordered are listed, but only abnormal results are displayed) ?Labs Reviewed  ?CBC WITH DIFFERENTIAL/PLATELET - Abnormal; Notable for the following components:  ?    Result Value  ? HCT 37.8 (*)   ? All other components within normal limits  ?BASIC METABOLIC PANEL - Abnormal; Notable for the following components:  ? Sodium 132 (*)   ? Glucose, Bld 409 (*)   ? Calcium 8.8 (*)   ? All other components within normal limits  ?CBG MONITORING, ED - Abnormal; Notable for the following components:  ? Glucose-Capillary 373 (*)   ? All other components within normal limits  ?I-STAT VENOUS BLOOD GAS, ED - Abnormal; Notable for the  following components:  ? pO2, Ven 166 (*)   ? Bicarbonate 31.1 (*)   ? TCO2 33 (*)   ? Acid-Base Excess 4.0 (*)   ? HCT 38.0 (*)   ? Hemoglobin 12.9 (*)   ? All other components within normal limits  ?URINALYSIS, ROUTINE W REFLEX MICROSCOPIC  ? ? ?EKG ?None ? ?Radiology ?No results found. ? ?Procedures ?Procedures  ? ? ?Medications Ordered in ED ?Medications - No data to display ? ?ED Course/ Medical Decision Making/ A&P ?  ?                        ?Medical Decision Making ?Patient here for medication refill.  His feet look well cared for, no ulcerations or other concerns for infection.  He has no new problems with his feet.  Normal DP and PT pulse.  Encouraged to follow-up in the outpatient setting.  Patient appears otherwise appropriate for discharge at this time ? ? ? ?Final Clinical Impression(s) / ED Diagnoses ?Final diagnoses:  ?Encounter for medication refill  ? ? ?Rx / DC Orders ?ED Discharge Orders   ? ?      Ordered  ?  gabapentin (NEURONTIN) 100 MG capsule  3 times daily       ? 02/05/22 1407  ? ?  ?  ? ?  ? ? ?  ?Arthor Captain, PA-C ?02/05/22 1420 ? ?  ?Margarita Grizzle, MD ?02/08/22 1408 ? ?

## 2022-02-05 NOTE — ED Provider Triage Note (Signed)
Emergency Medicine Provider Triage Evaluation Note ? ?Kevin Russell , a 36 y.o. male  was evaluated in triage.  Pt complains of bilateral foot pain.  Hx of DM1 he was diagnosed with neuropathy recently and given Neurontin but has since run out.  States medication only helped a little.  Denies any wounds to the feet.  No fevers. ? ?Review of Systems  ?Positive: Foot pain ?Negative: fever ? ?Physical Exam  ?BP 129/84 (BP Location: Right Arm)   Pulse 100   Temp 98 ?F (36.7 ?C) (Oral)   Resp 18   SpO2 100%  ?Gen:   Awake, no distress   ?Resp:  Normal effort  ?MSK:   Moves extremities without difficulty  ?Other:  No open wounds/sores noted to the feet on exam ? ?Medical Decision Making  ?Medically screening exam initiated at 1:57 AM.  Appropriate orders placed.  Kevin Russell was informed that the remainder of the evaluation will be completed by another provider, this initial triage assessment does not replace that evaluation, and the importance of remaining in the ED until their evaluation is complete. ? ?Bilateral foot pain.  CBG in triage 373.  Will check labs, VBG, UA. ?  ?Garlon Hatchet, PA-C ?02/05/22 0200 ? ?

## 2022-02-05 NOTE — ED Notes (Signed)
Pt states has numbness and tingling in feet. Pt has 2+ pedal pulses bilat, cap refill less than 3 sec bilat, warm to touch, pt able to wiggle toes. ?

## 2022-02-05 NOTE — ED Triage Notes (Signed)
Pt reports sharp pain and tingling to b/l feet, states he was diagnosed with neuropathy. Hx of diabetes. No wounds visible. ?

## 2022-02-05 NOTE — Discharge Instructions (Addendum)
Contact a health care provider if: ?You have new signs or symptoms of peripheral neuropathy. ?You are struggling emotionally from dealing with peripheral neuropathy. ?Your pain is not well controlled. ?Get help right away if: ?You have an injury or infection that is not healing normally. ?You develop new weakness in an arm or leg. ?You have fallen or do so frequently. ?

## 2022-02-24 DIAGNOSIS — Z794 Long term (current) use of insulin: Secondary | ICD-10-CM | POA: Insufficient documentation

## 2022-02-24 DIAGNOSIS — E1142 Type 2 diabetes mellitus with diabetic polyneuropathy: Secondary | ICD-10-CM | POA: Insufficient documentation

## 2022-02-25 ENCOUNTER — Other Ambulatory Visit: Payer: Self-pay

## 2022-02-25 ENCOUNTER — Emergency Department (HOSPITAL_COMMUNITY)
Admission: EM | Admit: 2022-02-25 | Discharge: 2022-02-25 | Disposition: A | Discharge status: Home / Self Care | Payer: Self-pay | Attending: Student | Admitting: Student

## 2022-02-25 ENCOUNTER — Encounter (HOSPITAL_COMMUNITY): Payer: Self-pay | Admitting: Emergency Medicine

## 2022-02-25 DIAGNOSIS — G6289 Other specified polyneuropathies: Secondary | ICD-10-CM

## 2022-02-25 MED ORDER — ACETAMINOPHEN 325 MG PO TABS
650.0000 mg | ORAL_TABLET | Freq: Once | ORAL | Status: AC
  Administered 2022-02-25: 650 mg via ORAL
  Filled 2022-02-25: qty 2

## 2022-02-25 MED ORDER — GABAPENTIN 100 MG PO CAPS
100.0000 mg | ORAL_CAPSULE | Freq: Once | ORAL | Status: AC
  Administered 2022-02-25: 100 mg via ORAL
  Filled 2022-02-25: qty 1

## 2022-02-25 MED ORDER — DICLOFENAC SODIUM 1 % EX GEL: 2.0000 g | Freq: Four times a day (QID) | CUTANEOUS | 0 refills | Status: DC

## 2022-02-25 MED ORDER — GABAPENTIN 100 MG PO CAPS
100.0000 mg | ORAL_CAPSULE | Freq: Three times a day (TID) | ORAL | 1 refills | Status: DC
Start: 2022-02-25 — End: 2022-05-01

## 2022-02-25 NOTE — Discharge Instructions (Addendum)
Please use Tylenol or ibuprofen for pain.  You may use 600 mg ibuprofen every 6 hours or 1000 mg of Tylenol every 6 hours.  You may choose to alternate between the 2.  This would be most effective.  Not to exceed 4 g of Tylenol within 24 hours.  Not to exceed 3200 mg ibuprofen 24 hours. ? ?You can take the gabapentin in addition to the above. ?

## 2022-02-25 NOTE — ED Provider Notes (Signed)
?[facility] EMERGENCY DEPARTMENT ?Provider Note ? ? ?CSN: [id number] ?Arrival date & time: 02/24/22  2327 ? ?  ? ?History ? ?Chief Complaint  ?Patient presents with  ? Foot Burn  ? ? ?Kevin Russell is a 36 y.o. male with a past medical history significant for diabetes, depression, peripheral neuropathy who presents with concern for worsening peripheral neuropathy in bilateral feet.  Patient reports that this was a new onset problem despite years of diabetes.  He does report that he had poorly controlled diabetes in the past.  At this time he reports that he is taking his insulin as prescribed, not missed any doses recently.  Patient reports that he is trying to get into see PCP but no appointments are available until August. He was taking gabapentin 6x daily, not taking other pain meds. Reports he is on his feet for long periods of time which makes the pain worse. ? ?HPI ? ?  ? ?Home Medications ?Prior to Admission medications   ?Medication Sig Start Date End Date Taking? Authorizing Provider  ?diclofenac Sodium (VOLTAREN) 1 % GEL Apply 2 g topically 4 (four) times daily. 02/25/22  Yes Kaysan Peixoto H, PA-C  ?fluvoxaMINE (LUVOX) 100 MG tablet Take 1 tablet (100 mg total) by mouth daily. ?Patient not taking: Reported on 10/22/2021 05/18/19   Aldean Baker, NP  ?gabapentin (NEURONTIN) 100 MG capsule Take 1 capsule (100 mg total) by mouth 3 (three) times daily. 02/25/22   Enya Bureau H, PA-C  ?insulin aspart protamine- aspart (NOVOLOG MIX 70/30) (70-30) 100 UNIT/ML injection Inject 0.15 mLs (15 Units total) into the skin daily. ?Patient taking differently: Inject 15 Units into the skin 2 (two) times daily. 01/19/22   Gerhard Munch, MD  ?   ? ?Allergies    ?Patient has no known allergies.   ? ?Review of Systems   ?Review of Systems  ?All other systems reviewed and are negative. ? ?Physical Exam ?Updated Vital Signs ?BP (!) 115/95 (BP Location: Right Arm)   Pulse 100   Temp 98 ?F (36.7 ?C)    Resp 17   Ht 5\' 11"  (1.803 m)   Wt 63.5 kg   SpO2 100%   BMI 19.53 kg/m?  ?Physical Exam ?Vitals and nursing note reviewed.  ?Constitutional:   ?   General: He is not in acute distress. ?   Appearance: Normal appearance.  ?HENT:  ?   Head: Normocephalic and atraumatic.  ?Eyes:  ?   General:     ?   Right eye: No discharge.     ?   Left eye: No discharge.  ?Cardiovascular:  ?   Rate and Rhythm: Normal rate and regular rhythm.  ?   Comments: Normal DP, PT pulses, 2+ bilaterally ?Pulmonary:  ?   Effort: Pulmonary effort is normal. No respiratory distress.  ?Musculoskeletal:     ?   General: No deformity.  ?Skin: ?   General: Skin is warm and dry.  ?Neurological:  ?   Mental Status: He is alert and oriented to person, place, and time.  ?   Comments: Some decreased sensation bilaterally subjectively  ?Psychiatric:     ?   Mood and Affect: Mood normal.     ?   Behavior: Behavior normal.  ? ? ?ED Results / Procedures / Treatments   ?Labs ?(all labs ordered are listed, but only abnormal results are displayed) ?Labs Reviewed - No data to display ? ?EKG ?None ? ?Radiology ?No results found. ? ?  Procedures ?Procedures  ? ? ?Medications Ordered in ED ?Medications  ?gabapentin (NEURONTIN) capsule 100 mg (has no administration in time range)  ?acetaminophen (TYLENOL) tablet 650 mg (has no administration in time range)  ? ? ?ED Course/ Medical Decision Making/ A&P ?  ?                        ?Medical Decision Making ?Risk ?OTC drugs. ?Prescription drug management. ? ? ?This is a well-appearing patient who complains of poorly controlled diabetic peripheral neuropathy. Physicaly exam consistent with same. Patient taking gabapentin 6x daily which is more often than prescribed, ran out of prescription early. Has not been able to establish PCP or podiatry care as of yet, but is working to. He has neurovascularly intact lower extremities, with strong DP, PT pulses. No signs of ulcers, gangrene. Discussed broadening pain regiment  to include otc pain meds, voltaren in addition to gabapentin. Discussed warning signs for ulcers, poorly healing wounds to watch out for. At this time we will control patient's pain and discharge with new rx gabapentin, as well as voltaren. Return precautions given, patient discharged in stable condition at this time. ?Final Clinical Impression(s) / ED Diagnoses ?Final diagnoses:  ?Other polyneuropathy  ? ? ?Rx / DC Orders ?ED Discharge Orders   ? ?      Ordered  ?  gabapentin (NEURONTIN) 100 MG capsule  3 times daily       ? 02/25/22 0933  ?  diclofenac Sodium (VOLTAREN) 1 % GEL  4 times daily       ? 02/25/22 0933  ? ?  ?  ? ?  ? ? ?  ?Olene Floss, PA-C ?02/25/22 3832 ? ?  ?Glendora Score, MD ?02/25/22 1507 ? ?

## 2022-02-25 NOTE — ED Triage Notes (Signed)
Pt c/o burning and sharp pain in bilateral feet since med Feb, hx of diabetes and diagnosis of neuropathy  ?

## 2022-04-30 ENCOUNTER — Inpatient Hospital Stay (HOSPITAL_COMMUNITY)
Admission: EM | Admit: 2022-04-30 | Discharge: 2022-05-01 | DRG: 637 | Disposition: A | Discharge status: Home / Self Care | Payer: Self-pay | Attending: Internal Medicine | Admitting: Internal Medicine

## 2022-04-30 ENCOUNTER — Encounter (HOSPITAL_COMMUNITY): Payer: Self-pay

## 2022-04-30 ENCOUNTER — Other Ambulatory Visit: Payer: Self-pay

## 2022-04-30 DIAGNOSIS — Z79899 Other long term (current) drug therapy: Secondary | ICD-10-CM

## 2022-04-30 DIAGNOSIS — Z794 Long term (current) use of insulin: Secondary | ICD-10-CM

## 2022-04-30 DIAGNOSIS — N170 Acute kidney failure with tubular necrosis: Secondary | ICD-10-CM | POA: Diagnosis present

## 2022-04-30 DIAGNOSIS — E1010 Type 1 diabetes mellitus with ketoacidosis without coma: Principal | ICD-10-CM

## 2022-04-30 DIAGNOSIS — N179 Acute kidney failure, unspecified: Secondary | ICD-10-CM

## 2022-04-30 DIAGNOSIS — E1110 Type 2 diabetes mellitus with ketoacidosis without coma: Secondary | ICD-10-CM | POA: Diagnosis present

## 2022-04-30 DIAGNOSIS — T383X6A Underdosing of insulin and oral hypoglycemic [antidiabetic] drugs, initial encounter: Secondary | ICD-10-CM | POA: Diagnosis present

## 2022-04-30 DIAGNOSIS — Z91148 Patient's other noncompliance with medication regimen for other reason: Secondary | ICD-10-CM

## 2022-04-30 DIAGNOSIS — Z91199 Patient's noncompliance with other medical treatment and regimen due to unspecified reason: Secondary | ICD-10-CM

## 2022-04-30 DIAGNOSIS — Z5986 Financial insecurity: Secondary | ICD-10-CM

## 2022-04-30 LAB — COMPREHENSIVE METABOLIC PANEL
ALT: 20 U/L (ref 0–44)
AST: 20 U/L (ref 15–41)
Albumin: 4.7 g/dL (ref 3.5–5.0)
Alkaline Phosphatase: 87 U/L (ref 38–126)
Anion gap: 28 — ABNORMAL HIGH (ref 5–15)
BUN: 19 mg/dL (ref 6–20)
CO2: 9 mmol/L — ABNORMAL LOW (ref 22–32)
Calcium: 9.9 mg/dL (ref 8.9–10.3)
Chloride: 97 mmol/L — ABNORMAL LOW (ref 98–111)
Creatinine, Ser: 1.52 mg/dL — ABNORMAL HIGH (ref 0.61–1.24)
GFR, Estimated: 60 mL/min — ABNORMAL LOW (ref >60)
Glucose, Bld: 428 mg/dL — ABNORMAL HIGH (ref 70–99)
Potassium: 5.6 mmol/L — ABNORMAL HIGH (ref 3.5–5.1)
Sodium: 134 mmol/L — ABNORMAL LOW (ref 135–145)
Total Bilirubin: 1.8 mg/dL — ABNORMAL HIGH (ref 0.3–1.2)
Total Protein: 8.2 g/dL — ABNORMAL HIGH (ref 6.5–8.1)

## 2022-04-30 LAB — URINALYSIS, COMPLETE (UACMP) WITH MICROSCOPIC
Bilirubin Urine: NEGATIVE
Glucose, UA: >=500 mg/dL — AB
Hgb urine dipstick: NEGATIVE
Ketones, ur: 80 mg/dL — AB
Leukocytes,Ua: NEGATIVE
Nitrite: NEGATIVE
Protein, ur: 30 mg/dL — AB
Specific Gravity, Urine: 1.023 (ref 1.005–1.030)
pH: 5 (ref 5.0–8.0)

## 2022-04-30 LAB — CBC WITH DIFFERENTIAL/PLATELET
Abs Immature Granulocytes: 0.05 10*3/uL (ref 0.00–0.07)
Basophils Absolute: 0.1 10*3/uL (ref 0.0–0.1)
Basophils Relative: 0 %
Eosinophils Absolute: 0 10*3/uL (ref 0.0–0.5)
Eosinophils Relative: 0 %
HCT: 50.7 % (ref 39.0–52.0)
Hemoglobin: 17.1 g/dL — ABNORMAL HIGH (ref 13.0–17.0)
Immature Granulocytes: 0 %
Lymphocytes Relative: 15 %
Lymphs Abs: 2 10*3/uL (ref 0.7–4.0)
MCH: 29.2 pg (ref 26.0–34.0)
MCHC: 33.7 g/dL (ref 30.0–36.0)
MCV: 86.7 fL (ref 80.0–100.0)
Monocytes Absolute: 1.1 10*3/uL — ABNORMAL HIGH (ref 0.1–1.0)
Monocytes Relative: 7 %
Neutro Abs: 10.9 10*3/uL — ABNORMAL HIGH (ref 1.7–7.7)
Neutrophils Relative %: 78 %
Platelets: 383 10*3/uL (ref 150–400)
RBC: 5.85 MIL/uL — ABNORMAL HIGH (ref 4.22–5.81)
RDW: 12.3 % (ref 11.5–15.5)
WBC: 14.1 10*3/uL — ABNORMAL HIGH (ref 4.0–10.5)
nRBC: 0 % (ref 0.0–0.2)

## 2022-04-30 LAB — BASIC METABOLIC PANEL
Anion gap: 26 — ABNORMAL HIGH (ref 5–15)
BUN: 19 mg/dL (ref 6–20)
CO2: 8 mmol/L — ABNORMAL LOW (ref 22–32)
Calcium: 9.6 mg/dL (ref 8.9–10.3)
Chloride: 100 mmol/L (ref 98–111)
Creatinine, Ser: 1.45 mg/dL — ABNORMAL HIGH (ref 0.61–1.24)
GFR, Estimated: >60 mL/min (ref >60)
Glucose, Bld: 396 mg/dL — ABNORMAL HIGH (ref 70–99)
Potassium: 6.2 mmol/L — ABNORMAL HIGH (ref 3.5–5.1)
Sodium: 134 mmol/L — ABNORMAL LOW (ref 135–145)

## 2022-04-30 LAB — I-STAT CHEM 8, ED
BUN: 21 mg/dL — ABNORMAL HIGH (ref 6–20)
Calcium, Ion: 1.08 mmol/L — ABNORMAL LOW (ref 1.15–1.40)
Chloride: 104 mmol/L (ref 98–111)
Creatinine, Ser: 0.9 mg/dL (ref 0.61–1.24)
Glucose, Bld: 447 mg/dL — ABNORMAL HIGH (ref 70–99)
HCT: 53 % — ABNORMAL HIGH (ref 39.0–52.0)
Hemoglobin: 18 g/dL — ABNORMAL HIGH (ref 13.0–17.0)
Potassium: 5.5 mmol/L — ABNORMAL HIGH (ref 3.5–5.1)
Sodium: 130 mmol/L — ABNORMAL LOW (ref 135–145)
TCO2: 10 mmol/L — ABNORMAL LOW (ref 22–32)

## 2022-04-30 LAB — I-STAT VENOUS BLOOD GAS, ED
Acid-base deficit: 17 mmol/L — ABNORMAL HIGH (ref 0.0–2.0)
Bicarbonate: 8.9 mmol/L — ABNORMAL LOW (ref 20.0–28.0)
Calcium, Ion: 1.07 mmol/L — ABNORMAL LOW (ref 1.15–1.40)
HCT: 52 % (ref 39.0–52.0)
Hemoglobin: 17.7 g/dL — ABNORMAL HIGH (ref 13.0–17.0)
O2 Saturation: 94 %
Potassium: 5.5 mmol/L — ABNORMAL HIGH (ref 3.5–5.1)
Sodium: 129 mmol/L — ABNORMAL LOW (ref 135–145)
TCO2: 10 mmol/L — ABNORMAL LOW (ref 22–32)
pCO2, Ven: 22.6 mmHg — ABNORMAL LOW (ref 44–60)
pH, Ven: 7.203 — ABNORMAL LOW (ref 7.25–7.43)
pO2, Ven: 85 mmHg — ABNORMAL HIGH (ref 32–45)

## 2022-04-30 LAB — RAPID URINE DRUG SCREEN, HOSP PERFORMED
Amphetamines: NOT DETECTED
Barbiturates: NOT DETECTED
Benzodiazepines: NOT DETECTED
Cocaine: NOT DETECTED
Opiates: NOT DETECTED
Tetrahydrocannabinol: NOT DETECTED

## 2022-04-30 LAB — CBG MONITORING, ED
Glucose-Capillary: 407 mg/dL — ABNORMAL HIGH (ref 70–99)
Glucose-Capillary: 390 mg/dL — ABNORMAL HIGH (ref 70–99)

## 2022-04-30 LAB — BETA-HYDROXYBUTYRIC ACID
Beta-Hydroxybutyric Acid: >8 mmol/L — ABNORMAL HIGH (ref 0.05–0.27)
Beta-Hydroxybutyric Acid: >8 mmol/L — ABNORMAL HIGH (ref 0.05–0.27)

## 2022-04-30 LAB — LIPASE, BLOOD: Lipase: 23 U/L (ref 11–51)

## 2022-04-30 MED ORDER — LACTATED RINGERS IV BOLUS
20.0000 mL/kg | Freq: Once | INTRAVENOUS | Status: AC
  Administered 2022-04-30: 1300 mL via INTRAVENOUS

## 2022-04-30 MED ORDER — LACTATED RINGERS IV BOLUS
1000.0000 mL | Freq: Once | INTRAVENOUS | Status: AC
  Administered 2022-04-30: 1000 mL via INTRAVENOUS

## 2022-04-30 MED ORDER — DEXTROSE 50 % IV SOLN: 0.0000 mL | INTRAVENOUS | Status: DC | PRN

## 2022-04-30 MED ORDER — ENOXAPARIN SODIUM 40 MG/0.4ML IJ SOSY
40.0000 mg | PREFILLED_SYRINGE | INTRAMUSCULAR | Status: DC
  Administered 2022-05-01: 40 mg via SUBCUTANEOUS
  Filled 2022-04-30: qty 0.4

## 2022-04-30 MED ORDER — LACTATED RINGERS IV SOLN: INTRAVENOUS | Status: DC

## 2022-04-30 MED ORDER — INSULIN REGULAR(HUMAN) IN NACL 100-0.9 UT/100ML-% IV SOLN
INTRAVENOUS | Status: DC
  Administered 2022-04-30: 7.5 [IU]/h via INTRAVENOUS
  Filled 2022-04-30: qty 100

## 2022-04-30 MED ORDER — DEXTROSE IN LACTATED RINGERS 5 % IV SOLN: INTRAVENOUS | Status: DC

## 2022-04-30 MED ORDER — LACTATED RINGERS IV BOLUS
1000.0000 mL | Freq: Once | INTRAVENOUS | Status: AC
Start: 2022-04-30 — End: 2022-04-30
  Administered 2022-04-30: 1000 mL via INTRAVENOUS

## 2022-04-30 MED ORDER — SODIUM CHLORIDE 0.9 % IV BOLUS: 1000.0000 mL | Freq: Once | INTRAVENOUS | Status: DC

## 2022-04-30 MED ORDER — ONDANSETRON 4 MG PO TBDP
4.0000 mg | ORAL_TABLET | Freq: Once | ORAL | Status: AC
  Administered 2022-04-30: 4 mg via ORAL
  Filled 2022-04-30: qty 1

## 2022-04-30 NOTE — H&P (Signed)
History and Physical    Patient: Kevin Russell:[id number] DOB: 1986-05-18 DOA: 04/30/2022 DOS: the patient was seen and examined on 04/30/2022 PCP: Pcp, No  Patient coming from: Home  Chief Complaint:  Chief Complaint  Patient presents with   DKA   n/v   HPI: Kevin Russell is a 36 y.o. male with medical history significant of DM1 very poorly controlled, A1C was > 15.5 x6 months ago when he was admitted for DKA.  Pt presents to ED with c/o N/V, and feeling like he is in DKA.  H/o DA x6 months ago.  No insulin taken today, last took a couple of days ago. No hematemesis No fevers / chills No diarrhea.   Review of Systems: As mentioned in the history of present illness. All other systems reviewed and are negative. Past Medical History:  Diagnosis Date   Diabetes mellitus without complication (HCC)    type 1   Suicidal ideation    History reviewed. No pertinent surgical history. Social History:  reports that he has been smoking cigarettes. He has never used smokeless tobacco. He reports current alcohol use. He reports current drug use. Drug: Marijuana.  No Known Allergies  History reviewed. No pertinent family history.  Prior to Admission medications   Medication Sig Start Date End Date Taking? Authorizing Provider  diclofenac Sodium (VOLTAREN) 1 % GEL Apply 2 g topically 4 (four) times daily. 02/25/22   Prosperi, Christian H, PA-C  fluvoxaMINE (LUVOX) 100 MG tablet Take 1 tablet (100 mg total) by mouth daily. Patient not taking: Reported on 10/22/2021 05/18/19   Aldean Baker, NP  gabapentin (NEURONTIN) 100 MG capsule Take 1 capsule (100 mg total) by mouth 3 (three) times daily. 02/25/22   Prosperi, Christian H, PA-C  insulin aspart protamine- aspart (NOVOLOG MIX 70/30) (70-30) 100 UNIT/ML injection Inject 0.15 mLs (15 Units total) into the skin daily. Patient taking differently: Inject 15 Units into the skin 2 (two) times daily. 01/19/22   Gerhard Munch, MD    Physical  Exam: Vitals:   04/30/22 1850 04/30/22 1930 04/30/22 1950 04/30/22 2100  BP: 111/72 108/72 109/90 113/77  Pulse: (!) 125 (!) 119 (!) 126 (!) 118  Resp: (!) 21 16 17 18   Temp: 98.5 F (36.9 C)     TempSrc: Oral     SpO2: 100% 98% 100% 100%  Weight:    65 kg   Constitutional: Ill appearing Eyes: PERRL, lids and conjunctivae normal ENMT: Mucous membranes are moist. Posterior pharynx clear of any exudate or lesions.Normal dentition.  Neck: normal, supple, no masses, no thyromegaly Respiratory: clear to auscultation bilaterally, no wheezing, no crackles. Normal respiratory effort. No accessory muscle use.  Cardiovascular: Tachycardic Abdomen: no tenderness, no masses palpated. No hepatosplenomegaly. Bowel sounds positive.  Musculoskeletal: no clubbing / cyanosis. No joint deformity upper and lower extremities. Good ROM, no contractures. Normal muscle tone.  Skin: no rashes, lesions, ulcers. No induration Neurologic: CN 2-12 grossly intact. Sensation intact, DTR normal. Strength 5/5 in all 4.  Psychiatric: Normal judgment and insight. Alert and oriented x 3. Normal mood.   Data Reviewed:    BHB > 8  CBC    Component Value Date/Time   WBC 14.1 (H) 04/30/2022 1837   RBC 5.85 (H) 04/30/2022 1837   HGB 17.7 (H) 04/30/2022 1856   HGB 18.0 (H) 04/30/2022 1856   HCT 52.0 04/30/2022 1856   HCT 53.0 (H) 04/30/2022 1856   PLT 383 04/30/2022 1837   MCV 86.7 04/30/2022  1837   MCH 29.2 04/30/2022 1837   MCHC 33.7 04/30/2022 1837   RDW 12.3 04/30/2022 1837   LYMPHSABS 2.0 04/30/2022 1837   MONOABS 1.1 (H) 04/30/2022 1837   EOSABS 0.0 04/30/2022 1837   BASOSABS 0.1 04/30/2022 1837   CMP     Component Value Date/Time   NA 129 (L) 04/30/2022 1856   NA 130 (L) 04/30/2022 1856   K 5.5 (H) 04/30/2022 1856   K 5.5 (H) 04/30/2022 1856   CL 104 04/30/2022 1856   CO2 9 (L) 04/30/2022 1837   GLUCOSE 447 (H) 04/30/2022 1856   BUN 21 (H) 04/30/2022 1856   CREATININE 0.90 04/30/2022 1856    CALCIUM 9.9 04/30/2022 1837   PROT 8.2 (H) 04/30/2022 1837   ALBUMIN 4.7 04/30/2022 1837   AST 20 04/30/2022 1837   ALT 20 04/30/2022 1837   ALKPHOS 87 04/30/2022 1837   BILITOT 1.8 (H) 04/30/2022 1837   GFRNONAA 60 (L) 04/30/2022 1837   GFRAA >60 05/13/2019 0316     Assessment and Plan: * DKA (diabetic ketoacidosis) (HCC) Due to taking no insulin today (per pt). History of same. DKA pathway Insulin gtt BMP Q4H Initial BHB > 8 PH 7.2 IVF bolus and maint per pathway Looks like total bolus volume of 4.3L in ED NPO  AKI (acute kidney injury) (HCC) AKI with creat of 1.5 up from baseline 0.8. Likely dehydration / ATN in setting of DKA IVF as above Trend BMPs      Advance Care Planning:   Code Status: Full Code  Consults: None  Family Communication: No family in room  Severity of Illness: The appropriate patient status for this patient is INPATIENT. Inpatient status is judged to be reasonable and necessary in order to provide the required intensity of service to ensure the patient's safety. The patient's presenting symptoms, physical exam findings, and initial radiographic and laboratory data in the context of their chronic comorbidities is felt to place them at high risk for further clinical deterioration. Furthermore, it is not anticipated that the patient will be medically stable for discharge from the hospital within 2 midnights of admission.   * I certify that at the point of admission it is my clinical judgment that the patient will require inpatient hospital care spanning beyond 2 midnights from the point of admission due to high intensity of service, high risk for further deterioration and high frequency of surveillance required.*  Author: Hillary Bow., DO 04/30/2022 10:12 PM  For on call review [email or web address].

## 2022-04-30 NOTE — ED Provider Notes (Signed)
[facility] EMERGENCY DEPARTMENT Provider Note   CSN: [id number] Arrival date & time: 04/30/22  1802     History  Chief Complaint  Patient presents with   DKA   n/v    Kevin Russell is a 36 y.o. male.  Patient presents with chief complaint of feeling nausea and vomiting and feeling like he has DKA again.  He states that he has not been compliant with his insulin and and last time he took it was "a couple days ago."  Otherwise denies any fevers or cough denies any diarrhea.  Denies any blood in his vomitus.         Home Medications Prior to Admission medications   Medication Sig Start Date End Date Taking? Authorizing Provider  diclofenac Sodium (VOLTAREN) 1 % GEL Apply 2 g topically 4 (four) times daily. 02/25/22   Prosperi, Christian H, PA-C  fluvoxaMINE (LUVOX) 100 MG tablet Take 1 tablet (100 mg total) by mouth daily. Patient not taking: Reported on 10/22/2021 05/18/19   Aldean Baker, NP  gabapentin (NEURONTIN) 100 MG capsule Take 1 capsule (100 mg total) by mouth 3 (three) times daily. 02/25/22   Prosperi, Christian H, PA-C  insulin aspart protamine- aspart (NOVOLOG MIX 70/30) (70-30) 100 UNIT/ML injection Inject 0.15 mLs (15 Units total) into the skin daily. Patient taking differently: Inject 15 Units into the skin 2 (two) times daily. 01/19/22   Gerhard Munch, MD      Allergies    Patient has no known allergies.    Review of Systems   Review of Systems  Constitutional:  Negative for fever.  HENT:  Negative for ear pain and sore throat.   Eyes:  Negative for pain.  Respiratory:  Negative for cough.   Cardiovascular:  Negative for chest pain.  Gastrointestinal:  Negative for abdominal pain.  Genitourinary:  Negative for flank pain.  Musculoskeletal:  Negative for back pain.  Skin:  Negative for color change and rash.  Neurological:  Negative for syncope.  All other systems reviewed and are negative.   Physical Exam Updated Vital Signs BP  113/77   Pulse (!) 118   Temp 98.5 F (36.9 C) (Oral)   Resp 18   Wt 65 kg   SpO2 100%   BMI 19.99 kg/m  Physical Exam Constitutional:      Appearance: He is well-developed.  HENT:     Head: Normocephalic.     Nose: Nose normal.  Eyes:     Extraocular Movements: Extraocular movements intact.  Cardiovascular:     Rate and Rhythm: Normal rate.  Pulmonary:     Effort: Pulmonary effort is normal.  Abdominal:     Tenderness: There is no abdominal tenderness. There is no guarding or rebound.  Skin:    Coloration: Skin is not jaundiced.  Neurological:     General: No focal deficit present.     Mental Status: He is alert and oriented to person, place, and time. Mental status is at baseline.     Cranial Nerves: No cranial nerve deficit.     Motor: No weakness.     ED Results / Procedures / Treatments   Labs (all labs ordered are listed, but only abnormal results are displayed) Labs Reviewed  BETA-HYDROXYBUTYRIC ACID - Abnormal; Notable for the following components:      Result Value   Beta-Hydroxybutyric Acid >8.00 (*)    All other components within normal limits  CBC WITH DIFFERENTIAL/PLATELET - Abnormal; Notable for the  following components:   WBC 14.1 (*)    RBC 5.85 (*)    Hemoglobin 17.1 (*)    Neutro Abs 10.9 (*)    Monocytes Absolute 1.1 (*)    All other components within normal limits  COMPREHENSIVE METABOLIC PANEL - Abnormal; Notable for the following components:   Sodium 134 (*)    Potassium 5.6 (*)    Chloride 97 (*)    CO2 9 (*)    Glucose, Bld 428 (*)    Creatinine, Ser 1.52 (*)    Total Protein 8.2 (*)    Total Bilirubin 1.8 (*)    GFR, Estimated 60 (*)    Anion gap 28 (*)    All other components within normal limits  URINALYSIS, COMPLETE (UACMP) WITH MICROSCOPIC - Abnormal; Notable for the following components:   Glucose, UA >=500 (*)    Ketones, ur 80 (*)    Protein, ur 30 (*)    Bacteria, UA RARE (*)    All other components within normal limits   CBG MONITORING, ED - Abnormal; Notable for the following components:   Glucose-Capillary 407 (*)    All other components within normal limits  I-STAT VENOUS BLOOD GAS, ED - Abnormal; Notable for the following components:   pH, Ven 7.203 (*)    pCO2, Ven 22.6 (*)    pO2, Ven 85 (*)    Bicarbonate 8.9 (*)    TCO2 10 (*)    Acid-base deficit 17.0 (*)    Sodium 129 (*)    Potassium 5.5 (*)    Calcium, Ion 1.07 (*)    Hemoglobin 17.7 (*)    All other components within normal limits  I-STAT CHEM 8, ED - Abnormal; Notable for the following components:   Sodium 130 (*)    Potassium 5.5 (*)    BUN 21 (*)    Glucose, Bld 447 (*)    Calcium, Ion 1.08 (*)    TCO2 10 (*)    Hemoglobin 18.0 (*)    HCT 53.0 (*)    All other components within normal limits  CBG MONITORING, ED - Abnormal; Notable for the following components:   Glucose-Capillary 390 (*)    All other components within normal limits  LIPASE, BLOOD  BETA-HYDROXYBUTYRIC ACID  RAPID URINE DRUG SCREEN, HOSP PERFORMED  BASIC METABOLIC PANEL  BASIC METABOLIC PANEL  BASIC METABOLIC PANEL  BASIC METABOLIC PANEL  BETA-HYDROXYBUTYRIC ACID  BETA-HYDROXYBUTYRIC ACID    EKG None  Radiology No results found.  Procedures .Critical Care  Performed by: Cheryll Cockayne, MD Authorized by: Cheryll Cockayne, MD   Critical care provider statement:    Critical care time (minutes):  30   Critical care time was exclusive of:  Separately billable procedures and treating other patients and teaching time   Critical care was necessary to treat or prevent imminent or life-threatening deterioration of the following conditions:  Metabolic crisis     Medications Ordered in ED Medications  sodium chloride 0.9 % bolus 1,000 mL (has no administration in time range)  insulin regular, human (MYXREDLIN) 100 units/ 100 mL infusion (7.5 Units/hr Intravenous New Bag/Given 04/30/22 2146)  lactated ringers infusion (has no administration in time range)   dextrose 5 % in lactated ringers infusion (has no administration in time range)  dextrose 50 % solution 0-50 mL (has no administration in time range)  ondansetron (ZOFRAN-ODT) disintegrating tablet 4 mg (4 mg Oral Given 04/30/22 1825)  lactated ringers bolus 1,000 mL (0 mLs Intravenous  Stopped 04/30/22 2157)  lactated ringers bolus 1,000 mL (0 mLs Intravenous Stopped 04/30/22 2156)  lactated ringers bolus 1,300 mL (1,300 mLs Intravenous New Bag/Given 04/30/22 2131)    ED Course/ Medical Decision Making/ A&P                           Medical Decision Making Amount and/or Complexity of Data Reviewed Labs: ordered.  Risk Prescription drug management. Decision regarding hospitalization.   Review of records shows prior evaluation for neuropathy May 2023.  Cardiac monitoring showing sinus tachycardia.  Work-up includes labs.  Glucose severely elevated, anion gap elevated.  Bicarb of 10.  pH of 7.2.  Given these findings patient given 2 L bolus of IV fluids, insulin drip started.  Patient admitted to the hospitalist team.        Final Clinical Impression(s) / ED Diagnoses Final diagnoses:  Diabetic ketoacidosis without coma associated with type 1 diabetes mellitus [facility])    Rx / DC Orders ED Discharge Orders     None         Cheryll Cockayne, MD 04/30/22 2206

## 2022-04-30 NOTE — ED Notes (Signed)
Kevin Russell [phone] requesting we tell the patient to turn his phone on and call her

## 2022-04-30 NOTE — Assessment & Plan Note (Addendum)
Due to taking no insulin today (per pt). History of same. 1. DKA pathway 2. Insulin gtt 3. BMP Q4H 4. Initial BHB > 8 5. PH 7.2 6. IVF bolus and maint per pathway 1. Looks like total bolus volume of 4.3L in ED 7. NPO

## 2022-04-30 NOTE — ED Provider Triage Note (Signed)
Emergency Medicine Provider Triage Evaluation Note  ARLYN BUERKLE , a 36 y.o. male  was evaluated in triage.  Pt complains of nausea and vomiting. Hx of DKA.  He has been vomiting all day today.  States that this feels the same.  He did not take any of his insulin today.  He has no other complaint  Review of Systems  Positive: Vomiting Negative: Fever  Physical Exam  BP 108/80 (BP Location: Left Arm)   Pulse (!) 120   Temp 98.4 F (36.9 C) (Oral)   Resp 20   SpO2 99%  Gen:   Awake, no distress   Resp:  Normal effort  MSK:   Moves extremities without difficulty  Other:  Vomiting  Medical Decision Making  Medically screening exam initiated at 6:19 PM.  Appropriate orders placed.  [name] was informed that the remainder of the evaluation will be completed by another provider, this initial triage assessment does not replace that evaluation, and the importance of remaining in the ED until their evaluation is complete.  Work-up initiated   Arthor Captain, PA-C 04/30/22 1822

## 2022-04-30 NOTE — Assessment & Plan Note (Addendum)
AKI with creat of 1.5 up from baseline 0.8. Likely dehydration / ATN in setting of DKA IVF as above Trend BMPs

## 2022-04-30 NOTE — ED Triage Notes (Signed)
Pt came in via POV d/t feeling like he is in DKA like he was earlier this year. Denies taking any insulin today, has been having n/v. Unaware when he last checked his sugar. A/Ox4. Feels very lethargic & weak.

## 2022-05-01 LAB — GLUCOSE, CAPILLARY
Glucose-Capillary: 333 mg/dL — ABNORMAL HIGH (ref 70–99)
Glucose-Capillary: 186 mg/dL — ABNORMAL HIGH (ref 70–99)
Glucose-Capillary: 174 mg/dL — ABNORMAL HIGH (ref 70–99)
Glucose-Capillary: 151 mg/dL — ABNORMAL HIGH (ref 70–99)
Glucose-Capillary: 124 mg/dL — ABNORMAL HIGH (ref 70–99)
Glucose-Capillary: 129 mg/dL — ABNORMAL HIGH (ref 70–99)
Glucose-Capillary: 114 mg/dL — ABNORMAL HIGH (ref 70–99)
Glucose-Capillary: 176 mg/dL — ABNORMAL HIGH (ref 70–99)
Glucose-Capillary: 213 mg/dL — ABNORMAL HIGH (ref 70–99)

## 2022-05-01 LAB — BASIC METABOLIC PANEL
Anion gap: 14 (ref 5–15)
Anion gap: 15 (ref 5–15)
BUN: 13 mg/dL (ref 6–20)
BUN: 13 mg/dL (ref 6–20)
CO2: 18 mmol/L — ABNORMAL LOW (ref 22–32)
CO2: 8 mmol/L — ABNORMAL LOW (ref 22–32)
Calcium: 6.5 mg/dL — ABNORMAL LOW (ref 8.9–10.3)
Calcium: 8.9 mg/dL (ref 8.9–10.3)
Chloride: 106 mmol/L (ref 98–111)
Chloride: 115 mmol/L — ABNORMAL HIGH (ref 98–111)
Creatinine, Ser: 0.96 mg/dL (ref 0.61–1.24)
Creatinine, Ser: 0.97 mg/dL (ref 0.61–1.24)
GFR, Estimated: >60 mL/min (ref >60)
GFR, Estimated: >60 mL/min (ref >60)
Glucose, Bld: 116 mg/dL — ABNORMAL HIGH (ref 70–99)
Glucose, Bld: 211 mg/dL — ABNORMAL HIGH (ref 70–99)
Potassium: 3.6 mmol/L (ref 3.5–5.1)
Potassium: 4.6 mmol/L (ref 3.5–5.1)
Sodium: 138 mmol/L (ref 135–145)
Sodium: 138 mmol/L (ref 135–145)

## 2022-05-01 LAB — BETA-HYDROXYBUTYRIC ACID
Beta-Hydroxybutyric Acid: 6.63 mmol/L — ABNORMAL HIGH (ref 0.05–0.27)
Beta-Hydroxybutyric Acid: 2.47 mmol/L — ABNORMAL HIGH (ref 0.05–0.27)

## 2022-05-01 LAB — HEMOGLOBIN A1C
Hgb A1c MFr Bld: 13.3 % — ABNORMAL HIGH (ref 4.8–5.6)
Mean Plasma Glucose: 335.01 mg/dL

## 2022-05-01 LAB — CBG MONITORING, ED: Glucose-Capillary: 269 mg/dL — ABNORMAL HIGH (ref 70–99)

## 2022-05-01 MED ORDER — GABAPENTIN 100 MG PO CAPS: 100.0000 mg | ORAL_CAPSULE | Freq: Three times a day (TID) | ORAL | 1 refills | Status: DC

## 2022-05-01 MED ORDER — INSULIN NPH (HUMAN) (ISOPHANE) 100 UNIT/ML ~~LOC~~ SUSP
15.0000 [IU] | Freq: Once | SUBCUTANEOUS | Status: AC
  Administered 2022-05-01: 15 [IU] via SUBCUTANEOUS
  Filled 2022-05-01: qty 10

## 2022-05-01 MED ORDER — ACETAMINOPHEN 325 MG PO TABS: 650.0000 mg | ORAL_TABLET | Freq: Four times a day (QID) | ORAL | Status: DC | PRN

## 2022-05-01 MED ORDER — INSULIN ASPART PROT & ASPART (70-30 MIX) 100 UNIT/ML ~~LOC~~ SUSP: 15.0000 [IU] | Freq: Two times a day (BID) | SUBCUTANEOUS | 3 refills | Status: DC

## 2022-05-01 MED ORDER — INSULIN ASPART 100 UNIT/ML IJ SOLN
0.0000 [IU] | Freq: Three times a day (TID) | INTRAMUSCULAR | Status: DC
  Administered 2022-05-01: 3 [IU] via SUBCUTANEOUS

## 2022-05-01 MED ORDER — ONDANSETRON HCL 4 MG PO TABS: 4.0000 mg | ORAL_TABLET | Freq: Every day | ORAL | 1 refills | Status: DC | PRN

## 2022-05-01 MED ORDER — ONDANSETRON HCL 4 MG/2ML IJ SOLN: 4.0000 mg | Freq: Four times a day (QID) | INTRAMUSCULAR | Status: DC | PRN

## 2022-05-01 MED ORDER — INSULIN ASPART PROT & ASPART (70-30 MIX) 100 UNIT/ML ~~LOC~~ SUSP
18.0000 [IU] | Freq: Two times a day (BID) | SUBCUTANEOUS | Status: DC
  Filled 2022-05-01 (×2): qty 10

## 2022-05-01 NOTE — TOC Initial Note (Signed)
Transition of Care [facility]) - Initial/Assessment Note    Patient Details  Name: Kevin Russell MRN: [id number] Date of Birth: 1986/01/20  Transition of Care [facility]) CM/SW Contact:    Danie Chandler., RN Phone Number: 05/01/2022, 12:26 PM  Clinical Narrative:    Kevin Russell is a 36 y.o. male with medical history significant of DM1 very poorly controlled, A1C was > 15.5 x6 months ago when he was admitted for DKA.  RNCM received MATCH request.  Patient given MATCH letter with instruction, all questions answered.         Expected Discharge Plan: Home/Self Care Barriers to Discharge: No Barriers Identified  Patient Goals and CMS Choice Patient states their goals for this hospitalization and ongoing recovery are:: return home     Expected Discharge Plan and Services Expected Discharge Plan: Home/Self Care   Discharge Planning Services: CM Consult, MATCH Program   Living arrangements for the past 2 months: Apartment Expected Discharge Date: 05/01/22                         HH Arranged: NA HH Agency: NA       Prior Living Arrangements/Services Living arrangements for the past 2 months: Apartment Lives with:: Significant Other Patient language and need for interpreter reviewed:: Yes Do you feel safe going back to the place where you live?: Yes      Need for Family Participation in Patient Care: No (Comment) Care giver support system in place?: Yes (comment)   Criminal Activity/Legal Involvement Pertinent to Current Situation/Hospitalization: No - Comment as needed  Activities of Daily Living     Permission Sought/Granted              Emotional Assessment Appearance:: Appears stated age Attitude/Demeanor/Rapport: Gracious Affect (typically observed): Accepting Orientation: : Oriented to Self, Oriented to Place, Oriented to  Time, Oriented to Situation   Psych Involvement: No (comment)  Admission diagnosis:  DKA (diabetic ketoacidosis) (HCC) [E11.10] Diabetic  ketoacidosis without coma associated with type 1 diabetes mellitus (HCC) [E10.10] Patient Active Problem List   Diagnosis Date Noted   DKA (diabetic ketoacidosis) (HCC) 10/22/2021   Leukocytosis 10/22/2021   Erythrocytosis 10/22/2021   Thrombocytosis 10/22/2021   AKI (acute kidney injury) (HCC) 10/22/2021   Hypocalcemia 10/22/2021   Diabetic ketoacidosis (HCC) 10/22/2021   MDD (major depressive disorder), recurrent severe, 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 [place] [address] Phone: [phone] Fax: [phone]  Social Determinants of Health (SDOH) Interventions   Readmission Risk Interventions     No data to display

## 2022-05-01 NOTE — Discharge Instructions (Signed)
Please follow up at [facility], call [phone] for an appointment.

## 2022-05-01 NOTE — Inpatient Diabetes Management (Signed)
Inpatient Diabetes Program Recommendations  AACE/ADA: New Consensus Statement on Inpatient Glycemic Control (2015)  Target Ranges:  Prepandial:   less than 140 mg/dL      Peak postprandial:   less than 180 mg/dL (1-2 hours)      Critically ill patients:  140 - 180 mg/dL   Lab Results  Component Value Date   GLUCAP 213 (H) 05/01/2022   HGBA1C 13.3 (H) 04/30/2022    Review of Glycemic Control  Diabetes history: type 2 Outpatient Diabetes medications: Novolin Relion 70/30 15 units BID Current orders for Inpatient glycemic control: IV insulin drip  Inpatient Diabetes Program Recommendations:   Spoke with patient on the phone. States that he was diagnosed with diabetes on Easter Sunday, 2019. He was started on insulin at that time. He takes Relion [employer] Novolin 70/30 15 units BID with vial and syringe. He knows that he can get this insulin at [facility] for $25 per vial. He stated that he ran out of money the last few days and ran out of insulin. He does have a home blood glucose meter and strips. Encouraged him to check his blood sugars at  least twice a day and record the results. He states that he has an appointment at [facility] on August 1. The appointment was mad in March. Discussed his HgbA1C of 13.3% and that his blood sugars have been running >300 mg/dl over the last 2-3 months. He seems to know that he needs to get control of his diabetes. He also states that he drinks water and zero sugar sodas.   Will continue to monitor blood sugars while in the hospital.  Smith Mince RN BSN CDE Diabetes Coordinator Pager: [phone]  8am-5pm

## 2022-05-01 NOTE — Discharge Summary (Signed)
PATIENT DETAILS Name: Kevin Russell Age: 36 y.o. Sex: male Date of Birth: 03/12/1986 MRN: [id number]. Admitting Physician: Etta Quill, DO PCP:Pcp, No  Admit Date: 04/30/2022 Discharge date: 05/01/2022  Recommendations for Outpatient Follow-up:  Follow up with PCP in 1-2 weeks Please obtain CMP/CBC in one week Counseled regarding importance of compliance to insulin Needs long-term better diabetic control.  Admitted From:  Home  Disposition: Home   Discharge Condition: good  CODE STATUS:   Code Status: Full Code   Diet recommendation:  Diet Order             Diet Carb Modified Fluid consistency: Thin; Room service appropriate? Yes  Diet effective now           Diet general                    Brief Summary: 36 year old male with DM-1 (poor long-term control)-who ran out of his insulin 70/30 2 days back-presented to the hospital with nausea/vomiting-was found to have DKA.  Brief Hospital Course: DKA:Due to noncompliance-patient ran out of insulin approximately 2 days back-and could not get refills due to financial issues.  Managed with IVF/IV insulin-once anion gap closed-was transitioned to SQ insulin.  Per nursing staff-tolerating advancement in diet.  DM-1 (A1c 13.3 on 7/7) with uncontrolled hyperglycemia: Poor long-term control-A1c since December 2022 has been significantly elevated.  I suspect compliance is a problem-unclear whether this is due to financial issues.  Since he ran out of insulin-and claims that he cannot get any further insulin till Monday when he gets money-have consulted case management to see if we can provide medication assistance.  Extensive counseling done this morning.  AKI: Due to osmotic diuresis in the setting of DKA-has resolved.  Noncompliance: Counseled-see above  BMI: Estimated body mass index is 19.99 kg/m as calculated from the following:   Height as of 02/25/22: 5\' 11"  (1.803 m).   Weight as of this encounter: 65 kg.    Discharge Diagnoses:  Principal Problem:   DKA (diabetic ketoacidosis) ([place]) Active Problems:   AKI (acute kidney injury) [facility])   Discharge Instructions:  Activity:  As tolerated   Discharge Instructions     Call MD for:  persistant nausea and vomiting   Complete by: As directed    Call MD for:  severe uncontrolled pain   Complete by: As directed    Diet general   Complete by: As directed    Discharge instructions   Complete by: As directed    Follow with Primary MD  in 1-2 weeks  Check CBGs multiple times a day-keep a record of these readings and take it to your next appointment with a primary care practitioner.  Please take all your medications as prescribed.  Please get a complete blood count and chemistry panel checked by your Primary MD at your next visit, and again as instructed by your Primary MD.  Get Medicines reviewed and adjusted: Please take all your medications with you for your next visit with your Primary MD  Laboratory/radiological data: Please request your Primary MD to go over all hospital tests and procedure/radiological results at the follow up, please ask your Primary MD to get all Hospital records sent to his/her office.  In some cases, they will be blood work, cultures and biopsy results pending at the time of your discharge. Please request that your primary care M.D. follows up on these results.  Also Note the following: If you experience worsening of your  admission symptoms, develop shortness of breath, life threatening emergency, suicidal or homicidal thoughts you must seek medical attention immediately by calling 911 or calling your MD immediately  if symptoms less severe.  You must read complete instructions/literature along with all the possible adverse reactions/side effects for all the Medicines you take and that have been prescribed to you. Take any new Medicines after you have completely understood and accpet all the possible adverse  reactions/side effects.   Do not drive when taking Pain medications or sleeping medications (Benzodaizepines)  Do not take more than prescribed Pain, Sleep and Anxiety Medications. It is not advisable to combine anxiety,sleep and pain medications without talking with your primary care practitioner  Special Instructions: If you have smoked or chewed Tobacco  in the last 2 yrs please stop smoking, stop any regular Alcohol  and or any Recreational drug use.  Wear Seat belts while driving.  Please note: You were cared for by a hospitalist during your hospital stay. Once you are discharged, your primary care physician will handle any further medical issues. Please note that NO REFILLS for any discharge medications will be authorized once you are discharged, as it is imperative that you return to your primary care physician (or establish a relationship with a primary care physician if you do not have one) for your post hospital discharge needs so that they can reassess your need for medications and monitor your lab values.   Increase activity slowly   Complete by: As directed       Allergies as of 05/01/2022   No Known Allergies      Medication List     STOP taking these medications    diclofenac Sodium 1 % Gel Commonly known as: Voltaren   fluvoxaMINE 100 MG tablet Commonly known as: LUVOX       TAKE these medications    gabapentin 100 MG capsule Commonly known as: Neurontin Take 1 capsule (100 mg total) by mouth 3 (three) times daily.   insulin aspart protamine- aspart (70-30) 100 UNIT/ML injection Commonly known as: NOVOLOG MIX 70/30 Inject 0.15 mLs (15 Units total) into the skin 2 (two) times daily.   ondansetron 4 MG tablet Commonly known as: Zofran Take 1 tablet (4 mg total) by mouth daily as needed for nausea or vomiting.        Follow-up Information     Primary care MD. Schedule an appointment as soon as possible for a visit in 1 week(s).                  No Known Allergies   Other Procedures/Studies: No results found.   TODAY-DAY OF DISCHARGE:  Subjective:   Ahmeer Gabin today has no headache,no chest abdominal pain,no new weakness tingling or numbness, feels much better wants to go home today.   Objective:   Blood pressure 110/68, pulse (!) 103, temperature 98.6 F (37 C), temperature source Oral, resp. rate 18, weight 65 kg, SpO2 98 %.  Intake/Output Summary (Last 24 hours) at 05/01/2022 1202 Last data filed at 05/01/2022 1135 Gross per 24 hour  Intake 4604.62 ml  Output 700 ml  Net 3904.62 ml   Filed Weights   04/30/22 2100 05/01/22 0328  Weight: 65 kg 65 kg    Exam: Awake Alert, Oriented *3, No new F.N deficits, Normal affect [place].AT,PERRAL Supple Neck,No JVD, No cervical lymphadenopathy appriciated.  Symmetrical Chest wall movement, Good air movement bilaterally, CTAB RRR,No Gallops,Rubs or new Murmurs, No Parasternal Heave +ve B.Sounds, Abd  Soft, Non tender, No organomegaly appriciated, No rebound -guarding or rigidity. No Cyanosis, Clubbing or edema, No new Rash or bruise   PERTINENT RADIOLOGIC STUDIES: No results found.   PERTINENT LAB RESULTS: CBC: Recent Labs    04/30/22 1837 04/30/22 1856  WBC 14.1*  --   HGB 17.1* 17.7*  18.0*  HCT 50.7 52.0  53.0*  PLT 383  --    CMET CMP     Component Value Date/Time   NA 138 05/01/2022 0541   K 4.6 05/01/2022 0541   CL 106 05/01/2022 0541   CO2 18 (L) 05/01/2022 0541   GLUCOSE 116 (H) 05/01/2022 0541   BUN 13 05/01/2022 0541   CREATININE 0.96 05/01/2022 0541   CALCIUM 8.9 05/01/2022 0541   PROT 8.2 (H) 04/30/2022 1837   ALBUMIN 4.7 04/30/2022 1837   AST 20 04/30/2022 1837   ALT 20 04/30/2022 1837   ALKPHOS 87 04/30/2022 1837   BILITOT 1.8 (H) 04/30/2022 1837   GFRNONAA >60 05/01/2022 0541   GFRAA >60 05/13/2019 0316    GFR Estimated Creatinine Clearance: 95.9 mL/min (by C-G formula based on SCr of 0.96 mg/dL). Recent Labs     04/30/22 1837  LIPASE 23   No results for input(s): "CKTOTAL", "CKMB", "CKMBINDEX", "TROPONINI" in the last 72 hours. Invalid input(s): "POCBNP" No results for input(s): "DDIMER" in the last 72 hours. Recent Labs    04/30/22 1837  HGBA1C 13.3*   No results for input(s): "CHOL", "HDL", "LDLCALC", "TRIG", "CHOLHDL", "LDLDIRECT" in the last 72 hours. No results for input(s): "TSH", "T4TOTAL", "T3FREE", "THYROIDAB" in the last 72 hours.  Invalid input(s): "FREET3" No results for input(s): "VITAMINB12", "FOLATE", "FERRITIN", "TIBC", "IRON", "RETICCTPCT" in the last 72 hours. Coags: No results for input(s): "INR" in the last 72 hours.  Invalid input(s): "PT" Microbiology: No results found for this or any previous visit (from the past 240 hour(s)).  FURTHER DISCHARGE INSTRUCTIONS:  Get Medicines reviewed and adjusted: Please take all your medications with you for your next visit with your Primary MD  Laboratory/radiological data: Please request your Primary MD to go over all hospital tests and procedure/radiological results at the follow up, please ask your Primary MD to get all Hospital records sent to his/her office.  In some cases, they will be blood work, cultures and biopsy results pending at the time of your discharge. Please request that your primary care M.D. goes through all the records of your hospital data and follows up on these results.  Also Note the following: If you experience worsening of your admission symptoms, develop shortness of breath, life threatening emergency, suicidal or homicidal thoughts you must seek medical attention immediately by calling 911 or calling your MD immediately  if symptoms less severe.  You must read complete instructions/literature along with all the possible adverse reactions/side effects for all the Medicines you take and that have been prescribed to you. Take any new Medicines after you have completely understood and accpet all the  possible adverse reactions/side effects.   Do not drive when taking Pain medications or sleeping medications (Benzodaizepines)  Do not take more than prescribed Pain, Sleep and Anxiety Medications. It is not advisable to combine anxiety,sleep and pain medications without talking with your primary care practitioner  Special Instructions: If you have smoked or chewed Tobacco  in the last 2 yrs please stop smoking, stop any regular Alcohol  and or any Recreational drug use.  Wear Seat belts while driving.  Please note: You were cared for  by a hospitalist during your hospital stay. Once you are discharged, your primary care physician will handle any further medical issues. Please note that NO REFILLS for any discharge medications will be authorized once you are discharged, as it is imperative that you return to your primary care physician (or establish a relationship with a primary care physician if you do not have one) for your post hospital discharge needs so that they can reassess your need for medications and monitor your lab values.  Total Time spent coordinating discharge including counseling, education and face to face time equals less than 30 minutes.  SignedJeoffrey Massed 05/01/2022 12:02 PM

## 2022-05-25 ENCOUNTER — Ambulatory Visit: Payer: Self-pay | Admitting: Internal Medicine

## 2022-09-15 ENCOUNTER — Encounter: Payer: Self-pay | Admitting: Family Medicine

## 2022-09-15 ENCOUNTER — Ambulatory Visit: Payer: MEDICAID | Attending: Internal Medicine | Admitting: Family Medicine

## 2022-09-15 VITALS — BP 120/75 | HR 91 | Temp 98.9°F | Ht 71.0 in | Wt 137.4 lb

## 2022-09-15 DIAGNOSIS — E1042 Type 1 diabetes mellitus with diabetic polyneuropathy: Secondary | ICD-10-CM

## 2022-09-15 DIAGNOSIS — E1069 Type 1 diabetes mellitus with other specified complication: Secondary | ICD-10-CM

## 2022-09-15 DIAGNOSIS — Z1159 Encounter for screening for other viral diseases: Secondary | ICD-10-CM

## 2022-09-15 DIAGNOSIS — E119 Type 2 diabetes mellitus without complications: Secondary | ICD-10-CM

## 2022-09-15 LAB — POCT GLYCOSYLATED HEMOGLOBIN (HGB A1C): HbA1c POC (<> result, manual entry): >15 % (ref 4.0–5.6)

## 2022-09-15 LAB — GLUCOSE, POCT (MANUAL RESULT ENTRY): POC Glucose: 393 mg/dl — AB (ref 70–99)

## 2022-09-15 MED ORDER — GABAPENTIN 100 MG PO CAPS: 100.0000 mg | ORAL_CAPSULE | Freq: Three times a day (TID) | ORAL | 3 refills | Status: DC

## 2022-09-15 MED ORDER — INSULIN ASPART PROT & ASPART (70-30 MIX) 100 UNIT/ML ~~LOC~~ SUSP: 20.0000 [IU] | Freq: Two times a day (BID) | SUBCUTANEOUS | 3 refills | Status: DC

## 2022-09-15 NOTE — Progress Notes (Signed)
Subjective:  Patient ID: Kevin Russell, male    DOB: August 05, 1986  Age: 36 y.o. MRN: [id number]  CC: Diabetes   HPI Kevin Russell is a 36 y.o. year old male with a history of type 1 diabetes mellitus. Was diagnosed with Type 1 DM in 2019 and states he has been managing his Diabetes himself and obtaining insulin from [facility]. He states he has not had a PCP. In 04/2022 he did have a hospitalization for DKA.  Interval History:  He has not been checking his sugars at home but endorses adherence with his NovoLog 70/30 administering 15 units twice daily.  He has had no hypoglycemic episodes, denies visual concerns. His neuropathy is controlled on gabapentin. Denies additional concerns today. Past Medical History:  Diagnosis Date   Diabetes mellitus without complication ([place])    type 1   Suicidal ideation     History reviewed. No pertinent surgical history.  History reviewed. No pertinent family history.  Social History   Socioeconomic History   Marital status: Single    Spouse name: Not on file   Number of children: Not on file   Years of education: Not on file   Highest education level: Not on file  Occupational History   Not on file  Tobacco Use   Smoking status: Some Days    Years: 15.00    Types: Cigarettes   Smokeless tobacco: Never  Vaping Use   Vaping Use: Never used  Substance and Sexual Activity   Alcohol use: Yes    Comment: occasionally   Drug use: Yes    Types: Marijuana   Sexual activity: Yes  Other Topics Concern   Not on file  Social History Narrative   Not on file   Social Determinants of Health   Financial Resource Strain: Not on file  Food Insecurity: Not on file  Transportation Needs: Not on file  Physical Activity: Not on file  Stress: Not on file  Social Connections: Not on file    No Known Allergies  Outpatient Medications Prior to Visit  Medication Sig Dispense Refill   insulin aspart protamine- aspart (NOVOLOG MIX 70/30)  (70-30) 100 UNIT/ML injection Inject 0.15 mLs (15 Units total) into the skin 2 (two) times daily. 27 mL 3   gabapentin (NEURONTIN) 100 MG capsule Take 1 capsule (100 mg total) by mouth 3 (three) times daily. (Patient not taking: Reported on 09/15/2022) 90 capsule 1   ondansetron (ZOFRAN) 4 MG tablet Take 1 tablet (4 mg total) by mouth daily as needed for nausea or vomiting. (Patient not taking: Reported on 09/15/2022) 30 tablet 1   No facility-administered medications prior to visit.     ROS Review of Systems  Constitutional:  Negative for activity change and appetite change.  HENT:  Negative for sinus pressure and sore throat.   Respiratory:  Negative for chest tightness, shortness of breath and wheezing.   Cardiovascular:  Negative for chest pain and palpitations.  Gastrointestinal:  Negative for abdominal distention, abdominal pain and constipation.  Genitourinary: Negative.   Musculoskeletal: Negative.   Psychiatric/Behavioral:  Negative for behavioral problems and dysphoric mood.     Objective:  BP 120/75   Pulse 91   Temp 98.9 F (37.2 C) (Oral)   Ht _0  (1.803 m)   Wt 137 lb 6.4 oz (62.3 kg)   SpO2 100%   BMI 19.16 kg/m      09/15/2022    8:59 AM 05/01/2022   11:35 AM 05/01/2022  7:53 AM  BP/Weight  Systolic BP 956 213 96  Diastolic BP 75 68 56  Wt. (Lbs) 137.4    BMI 19.16 kg/m2        Physical Exam Constitutional:      Appearance: He is well-developed.  Cardiovascular:     Rate and Rhythm: Normal rate.     Heart sounds: Normal heart sounds. No murmur heard. Pulmonary:     Effort: Pulmonary effort is normal.     Breath sounds: Normal breath sounds. No wheezing or rales.  Chest:     Chest wall: No tenderness.  Abdominal:     General: Bowel sounds are normal. There is no distension.     Palpations: Abdomen is soft. There is no mass.     Tenderness: There is no abdominal tenderness.  Musculoskeletal:        General: Normal range of motion.     Right  lower leg: No edema.     Left lower leg: No edema.  Neurological:     Mental Status: He is alert and oriented to person, place, and time.  Psychiatric:        Mood and Affect: Mood normal.        Latest Ref Rng & Units 05/01/2022    5:41 AM 04/30/2022   11:42 PM 04/30/2022    9:40 PM  CMP  Glucose 70 - 99 mg/dL 116  211  396   BUN 6 - 20 mg/dL _0 Creatinine 0.61 - 1.24 mg/dL 0.96  0.97  1.45   Sodium 135 - 145 mmol/L 138  138  134   Potassium 3.5 - 5.1 mmol/L 4.6  3.6  6.2   Chloride 98 - 111 mmol/L 106  115  100   CO2 22 - 32 mmol/L _1 Calcium 8.9 - 10.3 mg/dL 8.9  6.5  9.6     Lipid Panel     Component Value Date/Time   CHOL 220 (H) 08/23/2018 0644   TRIG 135 08/23/2018 0644   HDL 52 08/23/2018 0644   CHOLHDL 4.2 08/23/2018 0644   VLDL 27 08/23/2018 0644   LDLCALC 141 (H) 08/23/2018 0644    CBC    Component Value Date/Time   WBC 14.1 (H) 04/30/2022 1837   RBC 5.85 (H) 04/30/2022 1837   HGB 17.7 (H) 04/30/2022 1856   HGB 18.0 (H) 04/30/2022 1856   HCT 52.0 04/30/2022 1856   HCT 53.0 (H) 04/30/2022 1856   PLT 383 04/30/2022 1837   MCV 86.7 04/30/2022 1837   MCH 29.2 04/30/2022 1837   MCHC 33.7 04/30/2022 1837   RDW 12.3 04/30/2022 1837   LYMPHSABS 2.0 04/30/2022 1837   MONOABS 1.1 (H) 04/30/2022 1837   EOSABS 0.0 04/30/2022 1837   BASOSABS 0.1 04/30/2022 1837    Lab Results  Component Value Date   HGBA1C >15 09/15/2022    Assessment & Plan:  1. Type 1 diabetes mellitus with other specified complication (HCC) Uncontrolled with A1c of greater than 15 NovoLog 70/30 dose increased to 20 units twice daily and he has been advised to uptitrate by 2 units every fourth day until blood sugars are at goal. Counseled on Diabetic diet, my plate method, 086 minutes of moderate intensity exercise/week Blood sugar logs with fasting goals of 80-120 mg/dl, random of less than 180 and in the event of sugars less than 60 mg/dl or greater than 400 mg/dl  encouraged to notify the clinic.  Advised on the need for annual eye exams, annual foot exams, Pneumonia vaccine. - POCT glucose (manual entry) - POCT glycosylated hemoglobin (Hb A1C) - Microalbumin / creatinine urine ratio - insulin aspart protamine- aspart (NOVOLOG MIX 70/30) (70-30) 100 UNIT/ML injection; Inject 0.2 mLs (20 Units total) into the skin 2 (two) times daily.  Dispense: 30 mL; Refill: 3 - CMP14+EGFR  2. Need for hepatitis C screening test - HCV Ab w Reflex to Quant PCR  3. 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  Dispense: 90 capsule; Refill: 3   Meds ordered this encounter  Medications   insulin aspart protamine- aspart (NOVOLOG MIX 70/30) (70-30) 100 UNIT/ML injection    Sig: Inject 0.2 mLs (20 Units total) into the skin 2 (two) times daily.    Dispense:  30 mL    Refill:  3   gabapentin (NEURONTIN) 100 MG capsule    Sig: Take 1 capsule (100 mg total) by mouth 3 (three) times daily.    Dispense:  90 capsule    Refill:  3    Follow-up: Return in about 3 months (around 12/16/2022) for Chronic medical conditions.       Charlott Rakes, MD, FAAFP. [facility] [place], [place]   09/15/2022, 9:51 AM

## 2022-09-15 NOTE — Patient Instructions (Signed)

## 2022-09-17 LAB — CMP14+EGFR
ALT: 21 IU/L (ref 0–44)
AST: 21 IU/L (ref 0–40)
Albumin/Globulin Ratio: 1.8 (ref 1.2–2.2)
Albumin: 4.8 g/dL (ref 4.1–5.1)
Alkaline Phosphatase: 116 IU/L (ref 44–121)
BUN/Creatinine Ratio: 12 (ref 9–20)
BUN: 12 mg/dL (ref 6–20)
Bilirubin Total: 0.7 mg/dL (ref 0.0–1.2)
CO2: 23 mmol/L (ref 20–29)
Calcium: 9.5 mg/dL (ref 8.7–10.2)
Chloride: 96 mmol/L (ref 96–106)
Creatinine, Ser: 0.98 mg/dL (ref 0.76–1.27)
Globulin, Total: 2.6 g/dL (ref 1.5–4.5)
Glucose: 484 mg/dL — ABNORMAL HIGH (ref 70–99)
Potassium: 5 mmol/L (ref 3.5–5.2)
Sodium: 138 mmol/L (ref 134–144)
Total Protein: 7.4 g/dL (ref 6.0–8.5)
eGFR: 101 mL/min/{1.73_m2} (ref >59)

## 2022-09-17 LAB — MICROALBUMIN / CREATININE URINE RATIO
Creatinine, Urine: 48.6 mg/dL
Microalb/Creat Ratio: 7 mg/g creat (ref 0–29)
Microalbumin, Urine: 3.3 ug/mL

## 2022-09-17 LAB — HCV AB W REFLEX TO QUANT PCR: HCV Ab: NONREACTIVE

## 2022-09-17 LAB — HCV INTERPRETATION

## 2022-10-21 ENCOUNTER — Inpatient Hospital Stay (HOSPITAL_COMMUNITY)
Admission: EM | Admit: 2022-10-21 | Discharge: 2022-10-25 | DRG: 638 | Disposition: A | Discharge status: Home / Self Care | Payer: Self-pay | Attending: Internal Medicine | Admitting: Internal Medicine

## 2022-10-21 ENCOUNTER — Other Ambulatory Visit: Payer: Self-pay

## 2022-10-21 ENCOUNTER — Emergency Department (HOSPITAL_COMMUNITY): Payer: Self-pay

## 2022-10-21 DIAGNOSIS — Z794 Long term (current) use of insulin: Secondary | ICD-10-CM

## 2022-10-21 DIAGNOSIS — Z1152 Encounter for screening for COVID-19: Secondary | ICD-10-CM

## 2022-10-21 DIAGNOSIS — Z91148 Patient's other noncompliance with medication regimen for other reason: Secondary | ICD-10-CM

## 2022-10-21 DIAGNOSIS — F17210 Nicotine dependence, cigarettes, uncomplicated: Secondary | ICD-10-CM | POA: Diagnosis present

## 2022-10-21 DIAGNOSIS — E860 Dehydration: Secondary | ICD-10-CM | POA: Diagnosis present

## 2022-10-21 DIAGNOSIS — F332 Major depressive disorder, recurrent severe without psychotic features: Secondary | ICD-10-CM | POA: Diagnosis present

## 2022-10-21 DIAGNOSIS — E876 Hypokalemia: Secondary | ICD-10-CM | POA: Diagnosis present

## 2022-10-21 DIAGNOSIS — E1040 Type 1 diabetes mellitus with diabetic neuropathy, unspecified: Secondary | ICD-10-CM | POA: Diagnosis present

## 2022-10-21 DIAGNOSIS — E872 Acidosis, unspecified: Secondary | ICD-10-CM | POA: Insufficient documentation

## 2022-10-21 DIAGNOSIS — N179 Acute kidney failure, unspecified: Secondary | ICD-10-CM | POA: Diagnosis present

## 2022-10-21 DIAGNOSIS — G9341 Metabolic encephalopathy: Secondary | ICD-10-CM | POA: Insufficient documentation

## 2022-10-21 DIAGNOSIS — Z79899 Other long term (current) drug therapy: Secondary | ICD-10-CM

## 2022-10-21 DIAGNOSIS — T383X6A Underdosing of insulin and oral hypoglycemic [antidiabetic] drugs, initial encounter: Secondary | ICD-10-CM | POA: Diagnosis present

## 2022-10-21 DIAGNOSIS — E1110 Type 2 diabetes mellitus with ketoacidosis without coma: Secondary | ICD-10-CM | POA: Diagnosis present

## 2022-10-21 DIAGNOSIS — E873 Alkalosis: Secondary | ICD-10-CM | POA: Diagnosis present

## 2022-10-21 DIAGNOSIS — E875 Hyperkalemia: Secondary | ICD-10-CM | POA: Insufficient documentation

## 2022-10-21 DIAGNOSIS — D72829 Elevated white blood cell count, unspecified: Secondary | ICD-10-CM | POA: Diagnosis present

## 2022-10-21 DIAGNOSIS — E869 Volume depletion, unspecified: Secondary | ICD-10-CM | POA: Insufficient documentation

## 2022-10-21 DIAGNOSIS — Z9151 Personal history of suicidal behavior: Secondary | ICD-10-CM

## 2022-10-21 DIAGNOSIS — R7989 Other specified abnormal findings of blood chemistry: Secondary | ICD-10-CM | POA: Diagnosis present

## 2022-10-21 DIAGNOSIS — R000 Tachycardia, unspecified: Secondary | ICD-10-CM | POA: Diagnosis present

## 2022-10-21 DIAGNOSIS — J101 Influenza due to other identified influenza virus with other respiratory manifestations: Secondary | ICD-10-CM | POA: Diagnosis present

## 2022-10-21 DIAGNOSIS — E871 Hypo-osmolality and hyponatremia: Secondary | ICD-10-CM | POA: Diagnosis present

## 2022-10-21 DIAGNOSIS — E1010 Type 1 diabetes mellitus with ketoacidosis without coma: Principal | ICD-10-CM | POA: Diagnosis present

## 2022-10-21 LAB — I-STAT VENOUS BLOOD GAS, ED
Acid-base deficit: 29 mmol/L — ABNORMAL HIGH (ref 0.0–2.0)
Acid-base deficit: 28 mmol/L — ABNORMAL HIGH (ref 0.0–2.0)
Bicarbonate: 3 mmol/L — ABNORMAL LOW (ref 20.0–28.0)
Bicarbonate: 3.8 mmol/L — ABNORMAL LOW (ref 20.0–28.0)
Calcium, Ion: 0.96 mmol/L — ABNORMAL LOW (ref 1.15–1.40)
Calcium, Ion: 1.01 mmol/L — ABNORMAL LOW (ref 1.15–1.40)
HCT: 54 % — ABNORMAL HIGH (ref 39.0–52.0)
HCT: 52 % (ref 39.0–52.0)
Hemoglobin: 18.4 g/dL — ABNORMAL HIGH (ref 13.0–17.0)
Hemoglobin: 17.7 g/dL — ABNORMAL HIGH (ref 13.0–17.0)
O2 Saturation: 76 %
O2 Saturation: 78 %
Potassium: 6 mmol/L — ABNORMAL HIGH (ref 3.5–5.1)
Potassium: 6.6 mmol/L (ref 3.5–5.1)
Sodium: 108 mmol/L — CL (ref 135–145)
Sodium: 108 mmol/L — CL (ref 135–145)
TCO2: <5 mmol/L — ABNORMAL LOW (ref 22–32)
TCO2: <5 mmol/L — ABNORMAL LOW (ref 22–32)
pCO2, Ven: 15.7 mmHg — CL (ref 44–60)
pCO2, Ven: 19.2 mmHg — CL (ref 44–60)
pH, Ven: 6.892 — CL (ref 7.25–7.43)
pH, Ven: 6.911 — CL (ref 7.25–7.43)
pO2, Ven: 67 mmHg — ABNORMAL HIGH (ref 32–45)
pO2, Ven: 68 mmHg — ABNORMAL HIGH (ref 32–45)

## 2022-10-21 LAB — COMPREHENSIVE METABOLIC PANEL
ALT: 23 U/L (ref 0–44)
AST: 38 U/L (ref 15–41)
Albumin: 3.8 g/dL (ref 3.5–5.0)
Alkaline Phosphatase: 107 U/L (ref 38–126)
BUN: 54 mg/dL — ABNORMAL HIGH (ref 6–20)
CO2: <7 mmol/L — ABNORMAL LOW (ref 22–32)
Calcium: 7.9 mg/dL — ABNORMAL LOW (ref 8.9–10.3)
Chloride: 79 mmol/L — ABNORMAL LOW (ref 98–111)
Creatinine, Ser: 2.59 mg/dL — ABNORMAL HIGH (ref 0.61–1.24)
GFR, Estimated: 32 mL/min — ABNORMAL LOW (ref >60)
Glucose, Bld: 924 mg/dL (ref 70–99)
Potassium: 6.3 mmol/L (ref 3.5–5.1)
Sodium: 116 mmol/L — CL (ref 135–145)
Total Bilirubin: 1.5 mg/dL — ABNORMAL HIGH (ref 0.3–1.2)
Total Protein: 7 g/dL (ref 6.5–8.1)

## 2022-10-21 LAB — GLUCOSE, CAPILLARY
Glucose-Capillary: 220 mg/dL — ABNORMAL HIGH (ref 70–99)
Glucose-Capillary: 189 mg/dL — ABNORMAL HIGH (ref 70–99)
Glucose-Capillary: 212 mg/dL — ABNORMAL HIGH (ref 70–99)
Glucose-Capillary: 195 mg/dL — ABNORMAL HIGH (ref 70–99)
Glucose-Capillary: 196 mg/dL — ABNORMAL HIGH (ref 70–99)

## 2022-10-21 LAB — CBC
HCT: 51.7 % (ref 39.0–52.0)
HCT: 43.9 % (ref 39.0–52.0)
Hemoglobin: 17.3 g/dL — ABNORMAL HIGH (ref 13.0–17.0)
Hemoglobin: 15.6 g/dL (ref 13.0–17.0)
MCH: 30.4 pg (ref 26.0–34.0)
MCH: 29.5 pg (ref 26.0–34.0)
MCHC: 33.5 g/dL (ref 30.0–36.0)
MCHC: 35.5 g/dL (ref 30.0–36.0)
MCV: 90.7 fL (ref 80.0–100.0)
MCV: 83.1 fL (ref 80.0–100.0)
Platelets: 356 10*3/uL (ref 150–400)
Platelets: 258 10*3/uL (ref 150–400)
RBC: 5.7 MIL/uL (ref 4.22–5.81)
RBC: 5.28 MIL/uL (ref 4.22–5.81)
RDW: 12.3 % (ref 11.5–15.5)
RDW: 12.2 % (ref 11.5–15.5)
WBC: 21.5 10*3/uL — ABNORMAL HIGH (ref 4.0–10.5)
WBC: 15 10*3/uL — ABNORMAL HIGH (ref 4.0–10.5)
nRBC: 0 % (ref 0.0–0.2)
nRBC: 0 % (ref 0.0–0.2)

## 2022-10-21 LAB — BETA-HYDROXYBUTYRIC ACID: Beta-Hydroxybutyric Acid: >8 mmol/L — ABNORMAL HIGH (ref 0.05–0.27)

## 2022-10-21 LAB — BLOOD GAS, VENOUS
Acid-base deficit: 13.3 mmol/L — ABNORMAL HIGH (ref 0.0–2.0)
Bicarbonate: 14.1 mmol/L — ABNORMAL LOW (ref 20.0–28.0)
O2 Saturation: 38.2 %
Patient temperature: 37
pCO2, Ven: 37 mmHg — ABNORMAL LOW (ref 44–60)
pH, Ven: 7.19 — CL (ref 7.25–7.43)
pO2, Ven: <31 mmHg — CL (ref 32–45)

## 2022-10-21 LAB — LACTIC ACID, PLASMA
Lactic Acid, Venous: 3.2 mmol/L (ref 0.5–1.9)
Lactic Acid, Venous: 1.6 mmol/L (ref 0.5–1.9)
Lactic Acid, Venous: 1.7 mmol/L (ref 0.5–1.9)
Lactic Acid, Venous: 1.6 mmol/L (ref 0.5–1.9)

## 2022-10-21 LAB — RESPIRATORY PANEL BY PCR

## 2022-10-21 LAB — RAPID URINE DRUG SCREEN, HOSP PERFORMED
Amphetamines: NOT DETECTED
Barbiturates: NOT DETECTED
Benzodiazepines: NOT DETECTED
Cocaine: NOT DETECTED
Opiates: NOT DETECTED
Tetrahydrocannabinol: NOT DETECTED

## 2022-10-21 LAB — TROPONIN I (HIGH SENSITIVITY)
Troponin I (High Sensitivity): 17 ng/L (ref <18)
Troponin I (High Sensitivity): 17 ng/L (ref <18)

## 2022-10-21 LAB — BASIC METABOLIC PANEL
Anion gap: 19 — ABNORMAL HIGH (ref 5–15)
Anion gap: 19 — ABNORMAL HIGH (ref 5–15)
BUN: 44 mg/dL — ABNORMAL HIGH (ref 6–20)
BUN: 38 mg/dL — ABNORMAL HIGH (ref 6–20)
CO2: 8 mmol/L — ABNORMAL LOW (ref 22–32)
CO2: 10 mmol/L — ABNORMAL LOW (ref 22–32)
Calcium: 7.8 mg/dL — ABNORMAL LOW (ref 8.9–10.3)
Calcium: 8.5 mg/dL — ABNORMAL LOW (ref 8.9–10.3)
Chloride: 98 mmol/L (ref 98–111)
Chloride: 97 mmol/L — ABNORMAL LOW (ref 98–111)
Creatinine, Ser: 1.68 mg/dL — ABNORMAL HIGH (ref 0.61–1.24)
Creatinine, Ser: 1.5 mg/dL — ABNORMAL HIGH (ref 0.61–1.24)
GFR, Estimated: 53 mL/min — ABNORMAL LOW (ref >60)
GFR, Estimated: >60 mL/min (ref >60)
Glucose, Bld: 305 mg/dL — ABNORMAL HIGH (ref 70–99)
Glucose, Bld: 187 mg/dL — ABNORMAL HIGH (ref 70–99)
Potassium: 4.6 mmol/L (ref 3.5–5.1)
Potassium: 4.8 mmol/L (ref 3.5–5.1)
Sodium: 125 mmol/L — ABNORMAL LOW (ref 135–145)
Sodium: 126 mmol/L — ABNORMAL LOW (ref 135–145)

## 2022-10-21 LAB — URINALYSIS, ROUTINE W REFLEX MICROSCOPIC
Bacteria, UA: NONE SEEN
Bilirubin Urine: NEGATIVE
Glucose, UA: >=500 mg/dL — AB
Ketones, ur: 80 mg/dL — AB
Leukocytes,Ua: NEGATIVE
Nitrite: NEGATIVE
Protein, ur: 30 mg/dL — AB
Specific Gravity, Urine: 1.017 (ref 1.005–1.030)
pH: 5 (ref 5.0–8.0)

## 2022-10-21 LAB — CBG MONITORING, ED
Glucose-Capillary: >600 mg/dL (ref 70–99)
Glucose-Capillary: 572 mg/dL (ref 70–99)
Glucose-Capillary: 403 mg/dL — ABNORMAL HIGH (ref 70–99)
Glucose-Capillary: 334 mg/dL — ABNORMAL HIGH (ref 70–99)
Glucose-Capillary: 304 mg/dL — ABNORMAL HIGH (ref 70–99)

## 2022-10-21 LAB — PROCALCITONIN: Procalcitonin: 11.07 ng/mL

## 2022-10-21 LAB — MRSA NEXT GEN BY PCR, NASAL: MRSA by PCR Next Gen: NOT DETECTED

## 2022-10-21 LAB — LIPASE, BLOOD: Lipase: 22 U/L (ref 11–51)

## 2022-10-21 MED ORDER — LACTATED RINGERS IV SOLN: INTRAVENOUS | Status: DC

## 2022-10-21 MED ORDER — CHLORHEXIDINE GLUCONATE CLOTH 2 % EX PADS
6.0000 | MEDICATED_PAD | Freq: Every day | CUTANEOUS | Status: DC
  Administered 2022-10-22: 6 via TOPICAL

## 2022-10-21 MED ORDER — ACETAMINOPHEN 10 MG/ML IV SOLN
1000.0000 mg | Freq: Four times a day (QID) | INTRAVENOUS | Status: AC | PRN
  Filled 2022-10-21: qty 100

## 2022-10-21 MED ORDER — DOCUSATE SODIUM 100 MG PO CAPS: 100.0000 mg | ORAL_CAPSULE | Freq: Two times a day (BID) | ORAL | Status: DC | PRN

## 2022-10-21 MED ORDER — INSULIN REGULAR(HUMAN) IN NACL 100-0.9 UT/100ML-% IV SOLN
INTRAVENOUS | Status: DC
  Administered 2022-10-21: 6 [IU]/h via INTRAVENOUS
  Filled 2022-10-21: qty 100

## 2022-10-21 MED ORDER — DEXTROSE 50 % IV SOLN: 0.0000 mL | INTRAVENOUS | Status: DC | PRN

## 2022-10-21 MED ORDER — LACTATED RINGERS IV BOLUS
2000.0000 mL | Freq: Once | INTRAVENOUS | Status: AC
  Administered 2022-10-21: 2000 mL via INTRAVENOUS

## 2022-10-21 MED ORDER — DEXTROSE IN LACTATED RINGERS 5 % IV SOLN: INTRAVENOUS | Status: DC

## 2022-10-21 MED ORDER — POLYETHYLENE GLYCOL 3350 17 G PO PACK: 17.0000 g | PACK | Freq: Every day | ORAL | Status: DC | PRN

## 2022-10-21 MED ORDER — ONDANSETRON 4 MG PO TBDP
4.0000 mg | ORAL_TABLET | Freq: Once | ORAL | Status: AC
  Administered 2022-10-21: 4 mg via ORAL
  Filled 2022-10-21: qty 1

## 2022-10-21 MED ORDER — ENOXAPARIN SODIUM 40 MG/0.4ML IJ SOSY
40.0000 mg | PREFILLED_SYRINGE | INTRAMUSCULAR | Status: DC
  Administered 2022-10-21 – 2022-10-24 (×4): 40 mg via SUBCUTANEOUS
  Filled 2022-10-21 (×4): qty 0.4

## 2022-10-21 MED ORDER — LACTATED RINGERS IV BOLUS
30.0000 mL/kg | Freq: Once | INTRAVENOUS | Status: AC
  Administered 2022-10-21: 2040 mL via INTRAVENOUS

## 2022-10-21 MED ORDER — LACTATED RINGERS IV BOLUS
1000.0000 mL | Freq: Once | INTRAVENOUS | Status: AC
  Administered 2022-10-21: 1000 mL via INTRAVENOUS

## 2022-10-21 MED ORDER — ONDANSETRON HCL 4 MG/2ML IJ SOLN
4.0000 mg | Freq: Four times a day (QID) | INTRAMUSCULAR | Status: DC | PRN
  Administered 2022-10-24: 4 mg via INTRAVENOUS
  Filled 2022-10-21: qty 2

## 2022-10-21 NOTE — ED Notes (Signed)
Christain Prosperi PA made aware of the need for redraw of VBG, bad sample. ED-Lab.

## 2022-10-21 NOTE — ED Triage Notes (Signed)
Pt. Stated, I've had stomach pain with N/V and my sugar is too high

## 2022-10-21 NOTE — ED Provider Triage Note (Signed)
Emergency Medicine Provider Triage Evaluation Note  Kevin Russell , a 36 y.o. male  was evaluated in triage.  Pt complains of stomach pain, nausea, vomiting, as well as poorly controlled blood sugar, he reports generalized pain, as well as generalized stomach pain.  Patient reports that he has missed some doses of insulin, but did take some insulin around 2:00 this morning.  Patient with history of type 1 diabetes.  Review of Systems  Positive: Nausea, vomiting, stomach pain, weakness, fatigue Negative: Chest pain, fever  Physical Exam  BP 108/78 (BP Location: Left Arm)   Pulse (!) 124   Ht 5\' 11"  (1.803 m)   Wt 68 kg   SpO2 100%   BMI 20.92 kg/m  Gen:   Awake, ill-appearing, quite speech, appears fatigued Resp:  Rapid shallow respirations, smell of ketones MSK:   Moves extremities without difficulty  Other:  Tachycardia, generalized tenderness to palpation of the abdomen, no rebound, rigidity, guarding  Medical Decision Making  Medically screening exam initiated at 9:49 AM.  Appropriate orders placed.  was informed that the remainder of the evaluation will be completed by another provider, this initial triage assessment does not replace that evaluation, and the importance of remaining in the ED until their evaluation is complete.  High suspicion for DKA, DKA labs ordered, patient should receive a room sooner than later   [name], PA-C 10/21/22 10/23/22

## 2022-10-21 NOTE — Inpatient Diabetes Management (Signed)
Inpatient Diabetes Program Recommendations  AACE/ADA: New Consensus Statement on Inpatient Glycemic Control (2015)  Target Ranges:  Prepandial:   less than 140 mg/dL      Peak postprandial:   less than 180 mg/dL (1-2 hours)      Critically ill patients:  140 - 180 mg/dL   Lab Results  Component Value Date   GLUCAP >600 ([facility]) 10/21/2022   HGBA1C >15 09/15/2022    Review of Glycemic Control  Diabetes history: DM type 1 Outpatient Diabetes medications: 70/30 15 units bid Current orders for Inpatient glycemic control:  IV insulin/Endotool/DKA  Glucose 924 on presentation Goes to [facility] last visit 11/22 A1c on 11/22 >15% Briefly spoke to pt at bedside. Pt reports having the flu and skipped one dose of 70/30. Pt also reports drinking Gatorade. Pt reports knowing what to do for sick day guidelines. Pt reports he has plenty of insulin at home.   At baseline his A1c indicates poor control. Will follow glucose trends while here.   Thanks,  Christena Deem RN, MSN, BC-ADM Inpatient Diabetes Coordinator Team Pager [phone] (8a-5p)

## 2022-10-21 NOTE — ED Provider Notes (Signed)
[facility] EMERGENCY DEPARTMENT Provider Note  CSN: [id number] Arrival date & time: 10/21/22 0908  Chief Complaint(s) Abdominal Pain, Emesis, Nausea, and Blood Sugar Problem  HPI Kevin Russell is a 36 y.o. male with history of diabetes presenting with generalized weakness.  Patient reports nausea, vomiting, body aches.  For the past couple days.  He apparently told triage that he had stomach pain but he denies this to me.  He denies any cough, shortness of breath, runny nose, sore throat, painful urination, chest pain, hematemesis, hematochezia, fevers or chills.  Symptoms are severe.  He reports a worsening.  He did not come until today because he thought he had the flu.   Past Medical History Past Medical History:  Diagnosis Date  . 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 Suicidal ideation    Patient Active Problem List   Diagnosis Date Noted  . DKA (diabetic ketoacidosis) (HCC) 10/22/2021  . Leukocytosis 10/22/2021  . Erythrocytosis 10/22/2021  . Thrombocytosis 10/22/2021  . AKI (acute kidney injury) (HCC) 10/22/2021  . Hypocalcemia 10/22/2021  . Diabetic ketoacidosis (HCC) 10/22/2021  . 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 Major depressive disorder, single episode, severe without psychotic features (HCC) 08/22/2018  . Suicidal ideation 08/22/2018  . Diabetes mellitus without complication (HCC) 08/22/2018  . Cannabis use disorder, moderate, dependence (HCC) 08/22/2018  . Diabetes mellitus, new onset (HCC)   . Facial cellulitis 04/07/2017  . Periapical abscess 04/07/2017   Home Medication(s) Prior to Admission medications   Medication Sig Start Date End Date Taking? Authorizing Provider  gabapentin (NEURONTIN) 100 MG capsule Take 1 capsule (100 mg total) by mouth 3 (three) times daily. 09/15/22   Hoy Register, MD  insulin aspart protamine- aspart (NOVOLOG MIX 70/30) (70-30) 100  UNIT/ML injection Inject 0.2 mLs (20 Units total) into the skin 2 (two) times daily. 09/15/22 09/15/23  Hoy Register, MD                                                                                                                                    Past Surgical History No past surgical history on file. Family History No family history on file.  Social History Social History   Tobacco Use  . Smoking status: Some Days    Years: 15.00    Types: Cigarettes  . Smokeless tobacco: Never  Vaping Use  . Vaping Use: Never used  Substance Use Topics  . Alcohol use: Yes    Comment: occasionally  . Drug use: Yes    Types: Marijuana   Allergies Patient has no known allergies.  Review of Systems Review of Systems  All other systems reviewed and are negative.   Physical Exam Vital Signs  I have reviewed the triage vital signs BP (!) 132/93   Pulse (!) 112   Resp (!) 34   Ht  (  1.803 m)   Wt 68 kg   SpO2 100%   BMI 20.92 kg/m  Physical Exam Vitals and nursing note reviewed.  Constitutional:      General: He is in acute distress.     Appearance: Normal appearance. He is ill-appearing.  HENT:     Mouth/Throat:     Mouth: Mucous membranes are dry.  Eyes:     Conjunctiva/sclera: Conjunctivae normal.  Cardiovascular:     Rate and Rhythm: Regular rhythm. Tachycardia present.  Pulmonary:     Comments: Tachypnea with increased work of breathing and hyperpnea  Abdominal:     General: Abdomen is flat.     Palpations: Abdomen is soft.     Tenderness: There is no abdominal tenderness.  Musculoskeletal:     Right lower leg: No edema.     Left lower leg: No edema.  Skin:    General: Skin is warm and dry.     Capillary Refill: Capillary refill takes 2 to 3 seconds.  Neurological:     Mental Status: He is alert and oriented to person, place, and time. Mental status is at baseline.  Psychiatric:        Mood and Affect: Mood normal.        Behavior: Behavior normal.      ED Results and Treatments Labs (all labs ordered are listed, but only abnormal results are displayed) Labs Reviewed  COMPREHENSIVE METABOLIC PANEL - Abnormal; Notable for the following components:      Result Value   Sodium 116 (*)    Potassium 6.3 (*)    Chloride 79 (*)    CO2 <7 (*)    Glucose, Bld 924 (*)    BUN 54 (*)    Creatinine, Ser 2.59 (*)    Calcium 7.9 (*)    Total Bilirubin 1.5 (*)    GFR, Estimated 32 (*)    All other components within normal limits  CBC - Abnormal; Notable for the following components:   WBC 21.5 (*)    Hemoglobin 17.3 (*)    All other components within normal limits  URINALYSIS, ROUTINE W REFLEX MICROSCOPIC - Abnormal; Notable for the following components:   APPearance HAZY (*)    Glucose, UA >=500 (*)    Hgb urine dipstick MODERATE (*)    Ketones, ur 80 (*)    Protein, ur 30 (*)    All other components within normal limits  LACTIC ACID, PLASMA - Abnormal; Notable for the following components:   Lactic Acid, Venous 3.2 (*)    All other components within normal limits  I-STAT VENOUS BLOOD GAS, ED - Abnormal; Notable for the following components:   pH, Ven 6.892 (*)    pCO2, Ven 15.7 (*)    pO2, Ven 67 (*)    Bicarbonate 3.0 (*)    TCO2 <5 (*)    Acid-base deficit 29.0 (*)    Sodium 108 (*)    Potassium 6.0 (*)    Calcium, Ion 0.96 (*)    HCT 54.0 (*)    Hemoglobin 18.4 (*)    All other components within normal limits  CULTURE, BLOOD (ROUTINE X 2)  CULTURE, BLOOD (ROUTINE X 2)  LIPASE, BLOOD  BETA-HYDROXYBUTYRIC ACID  LACTIC ACID, PLASMA  BASIC METABOLIC PANEL  BASIC METABOLIC PANEL  BASIC METABOLIC PANEL  BLOOD GAS, VENOUS  CBG MONITORING, ED  I-STAT VENOUS BLOOD GAS, ED  Radiology No results found.  Pertinent labs & imaging results that were available during my care of the patient were reviewed by  me and considered in my medical decision making (see MDM for details).  Medications Ordered in ED Medications  insulin regular, human (MYXREDLIN) 100 units/ 100 mL infusion (6 Units/hr Intravenous New Bag/Given 10/21/22 1224)  lactated ringers infusion (has no administration in time range)  dextrose 5 % in lactated ringers infusion (has no administration in time range)  dextrose 50 % solution 0-50 mL (has no administration in time range)  ondansetron (ZOFRAN-ODT) disintegrating tablet 4 mg (4 mg Oral Given 10/21/22 0945)  lactated ringers bolus 2,040 mL (2,040 mLs Intravenous Bolus 10/21/22 1208)                                                                                                                                     Procedures .Critical Care  Performed by: Lonell GrandchildScheving, Niklaus Mamaril L, MD Authorized by: Lonell GrandchildScheving, Shaya Altamura L, MD   Critical care provider statement:    Critical care time (minutes):  30   Critical care was necessary to treat or prevent imminent or life-threatening deterioration of the following conditions:  Endocrine crisis, metabolic crisis and dehydration   Critical care was time spent personally by me on the following activities:  Development of treatment plan with patient or surrogate, discussions with consultants, evaluation of patient's response to treatment, examination of patient, ordering and review of laboratory studies, ordering and review of radiographic studies, ordering and performing treatments and interventions, pulse oximetry, re-evaluation of patient's condition and review of old charts   (including critical care time)  Medical Decision Making / ED Course   MDM:  36 year old male with diabetes presenting to the emergency department with "feeling bad".  Patient ill-appearing, alert and oriented.  Appears severely dehydrated.  Vital signs notable for tachycardia and tachypnea.  Suspect severe DKA.  VBG with very low pH.  He has a compensatory respiratory  alkalosis.  His anion gap was not even calculated because his CO2 on the CMP is undetectable.Marland Kitchen.  He is extremely hyperglycemic.  He also has an AKI with hyperkalemia.  Will treat with large IV fluid bolus and infusion as well as insulin drip.  Insulin will also treat his hyperkalemia.  He has no obvious infectious symptoms to suggest an underlying trigger such as a urinary infection, pneumonia, or intra-abdominal process.  Denies chest pain to suggest ischemia as cause of DKA.  Discussed with ICU team, they will admit the patient.  Clinical Course as of 10/21/22 1510  Thu Oct 21, 2022  1304 Discussed with ICU team who will see patient [WS]  1510 Patient admitted to ICU. [WS]    Clinical Course User Index [WS] Lonell GrandchildScheving, Shenell Rogalski L, MD     Additional history obtained: -External records from outside source obtained and reviewed including: Chart review including previous notes, labs, imaging, consultation notes including PMD visit  09/15/22   Lab Tests: -I ordered, reviewed, and interpreted labs.   The pertinent results include:   Labs Reviewed  COMPREHENSIVE METABOLIC PANEL - Abnormal; Notable for the following components:      Result Value   Sodium 116 (*)    Potassium 6.3 (*)    Chloride 79 (*)    CO2 <7 (*)    Glucose, Bld 924 (*)    BUN 54 (*)    Creatinine, Ser 2.59 (*)    Calcium 7.9 (*)    Total Bilirubin 1.5 (*)    GFR, Estimated 32 (*)    All other components within normal limits  CBC - Abnormal; Notable for the following components:   WBC 21.5 (*)    Hemoglobin 17.3 (*)    All other components within normal limits  URINALYSIS, ROUTINE W REFLEX MICROSCOPIC - Abnormal; Notable for the following components:   APPearance HAZY (*)    Glucose, UA >=500 (*)    Hgb urine dipstick MODERATE (*)    Ketones, ur 80 (*)    Protein, ur 30 (*)    All other components within normal limits  LACTIC ACID, PLASMA - Abnormal; Notable for the following components:   Lactic Acid, Venous 3.2  (*)    All other components within normal limits  I-STAT VENOUS BLOOD GAS, ED - Abnormal; Notable for the following components:   pH, Ven 6.892 (*)    pCO2, Ven 15.7 (*)    pO2, Ven 67 (*)    Bicarbonate 3.0 (*)    TCO2 <5 (*)    Acid-base deficit 29.0 (*)    Sodium 108 (*)    Potassium 6.0 (*)    Calcium, Ion 0.96 (*)    HCT 54.0 (*)    Hemoglobin 18.4 (*)    All other components within normal limits  CULTURE, BLOOD (ROUTINE X 2)  CULTURE, BLOOD (ROUTINE X 2)  LIPASE, BLOOD  BETA-HYDROXYBUTYRIC ACID  LACTIC ACID, PLASMA  BASIC METABOLIC PANEL  BASIC METABOLIC PANEL  BASIC METABOLIC PANEL  BLOOD GAS, VENOUS  CBG MONITORING, ED  I-STAT VENOUS BLOOD GAS, ED    Notable for severe DKA, AKI, hyperkalemia  EKG   EKG Interpretation  Date/Time:  Thursday October 21 2022 12:15:20 EST Ventricular Rate:  109 PR Interval:  158 QRS Duration: 101 QT Interval:  345 QTC Calculation: 465 R Axis:   93 Text Interpretation: Sinus tachycardia Right atrial enlargement Borderline right axis deviation ST elev, probable normal early repol pattern T waves less peaked than earlier Confirmed by Alvino Blood ([id number]) on 10/21/2022 12:44:00 PM          Medicines ordered and prescription drug management: Meds ordered this encounter  Medications  . ondansetron (ZOFRAN-ODT) disintegrating tablet 4 mg  . lactated ringers bolus 2,040 mL  . insulin regular, human (MYXREDLIN) 100 units/ 100 mL infusion    Order Specific Question:   EndoTool low target:    Answer:   140    Order Specific Question:   EndoTool high target:    Answer:   180    Order Specific Question:   Type of Diabetes    Answer:   Type 1    Order Specific Question:   Mode of Therapy    Answer:   ENDOX1 for DKA    Order Specific Question:   Start Method    Answer:   EndoTool to calculate  . lactated ringers infusion  . dextrose 5 % in lactated ringers infusion  .  dextrose 50 % solution 0-50 mL    -I have reviewed the  patients home medicines and have made adjustments as needed   Consultations Obtained: I requested consultation with the intensivist,  and discussed lab and imaging findings as well as pertinent plan - they recommend: admission   Cardiac Monitoring: The patient was maintained on a cardiac monitor.  I personally viewed and interpreted the cardiac monitored which showed an underlying rhythm of: sinus tachycardia  Social Determinants of Health:  Diagnosis or treatment significantly limited by social determinants of health: polysubstance abuse   Reevaluation: After the interventions noted above, I reevaluated the patient and found that they have improved  Co morbidities that complicate the patient evaluation . Past Medical History:  Diagnosis Date  . 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 Suicidal ideation       Dispostion: Disposition decision including need for hospitalization was considered, and patient admitted to the hospital.    Final Clinical Impression(s) / ED Diagnoses Final diagnoses:  Diabetic ketoacidosis without coma associated with type 1 diabetes mellitus (HCC)  Does not take medication     This chart was dictated using voice recognition software.  Despite best efforts to proofread,  errors can occur which can change the documentation meaning.    Lonell Grandchild, MD 10/21/22 1309

## 2022-10-21 NOTE — ED Notes (Signed)
Pt is complaining of back pain.  Pt suddenly started yelling out help help help went in and the pt states he needs help his back hurts MD is aware

## 2022-10-21 NOTE — H&P (Signed)
NAME:  Kevin Russell, MRN:  [id number], DOB:  19-Dec-1985, LOS: 0 ADMISSION DATE:  10/21/2022, CONSULTATION DATE:  10/21/2022 REFERRING MD:  Suezanne Jacquet, MD, CHIEF COMPLAINT:  DKA   History of Present Illness:  Kevin Russell is a 36 yo Male with a PMH of Type 1 DM (poor controlled) who came into [facility] with the c/o high blood sugars accompanied by stomach pain with nausea and vomiting and being concerned for having the flu.  Per chart review, pt did admit to missing some home doses of insulin.   In the ER he received 2L of LR. Labs significant for Glucose 924, Na 116, K+ 6.3, Total CO2 <7, BUN 54, Cr 2.59. Beta-hydroxybutyric acid >8.0, LA 3.2. UA positive for ketones and protein. CXR showing increased interstitial markings in lower lung fields L>R, questioning PNA. Blood Cultures pending.   PCCM consulted for DKA management.   Pertinent  Medical History  DM type 1, Smoker  Significant Hospital Events: Including procedures, antibiotic start and stop dates in addition to other pertinent events   12/28 - Admitted to ICU, 2L LR in ED  Interim History / Subjective:  Pt sleeping in the bed. Not able to provide much of a history. Admits to feeling sick and mostly compliant with insulin.   Objective   Blood pressure 130/82, pulse (Abnormal) 114, resp. rate 20, height 5\' 11"  (1.803 m), weight 68 kg, SpO2 100 %.       No intake or output data in the 24 hours ending 10/21/22 1427 Filed Weights   10/21/22 0934  Weight: 68 kg   Physical Examination: General: Acutely ill-appearing young adult in NAD. HEENT: [place]/AT, anicteric sclera, PERRL, moist mucous membranes. Neuro: Lethargic. Responds to verbal stimuli. Following commands consistently. Moves all 4 extremities spontaneously.  CV: Tachycardic, no m/g/r. PULM: Breathing even and unlabored on RA. Lung fields Clear. GI: Soft, nontender, nondistended. Normoactive bowel sounds. Extremities: No LE edema noted. Skin: Warm/dry,  intact.  Resolved Hospital Problem list     Assessment & Plan:  Diabetic Ketoacidosis likely d/t medication noncompliance but can not r/o viral component  Plan  - Aggressive Fluid resuscitation  - IV Insulin w/ EndoTool - CBGs per EndoTool - Serial BMP  - Trend Beta-hydroxybutyric acid - RSV pending  Acute Metabolic Encephalopathy > secondary to DKA Plan - Supportive Care - UDS pending - Blood Cultures Pending   AKI (baseline 0.8) > likely d/t dehydration in the setting of DKA - Trend BMP - Cr 2.59  - Replete electrolytes as indicated - Strict I&Os - F/u urine studies - Avoid nephrotoxic agents as able - Ensure adequate renal perfusion  Chronic Neuropathy Plan - On gabapentin at home, will resume when renal and mentation status improves.    Best Practice (right click and "Reselect all SmartList Selections" daily)   Diet/type: NPO DVT prophylaxis: LMWH GI prophylaxis: N/A Lines: N/A Foley:  N/A Code Status:  full code Last date of multidisciplinary goals of care discussion []   Labs   CBC: Recent Labs  Lab 10/21/22 0943 10/21/22 1120 10/21/22 1342  WBC 21.5*  --   --   HGB 17.3* 18.4* 17.7*  HCT 51.7 54.0* 52.0  MCV 90.7  --   --   PLT 356  --   --     Basic Metabolic Panel: Recent Labs  Lab 10/21/22 0943 10/21/22 1120 10/21/22 1342  NA 116* 108* 108*  K 6.3* 6.0* 6.6*  CL 79*  --   --  CO2 <7*  --   --   GLUCOSE 924*  --   --   BUN 54*  --   --   CREATININE 2.59*  --   --   CALCIUM 7.9*  --   --    GFR: Estimated Creatinine Clearance: 37.2 mL/min (A) (by C-G formula based on SCr of 2.59 mg/dL (H)). Recent Labs  Lab 10/21/22 0943 10/21/22 1103  WBC 21.5*  --   LATICACIDVEN  --  3.2*    Liver Function Tests: Recent Labs  Lab 10/21/22 0943  AST 38  ALT 23  ALKPHOS 107  BILITOT 1.5*  PROT 7.0  ALBUMIN 3.8   Recent Labs  Lab 10/21/22 0943  LIPASE 22   No results for input(s): "AMMONIA" in the last 168 hours.  ABG     Component Value Date/Time   HCO3 3.8 (L) 10/21/2022 1342   TCO2 <5 (L) 10/21/2022 1342   ACIDBASEDEF 28.0 (H) 10/21/2022 1342   O2SAT 78 10/21/2022 1342     Coagulation Profile: No results for input(s): "INR", "PROTIME" in the last 168 hours.  Cardiac Enzymes: No results for input(s): "CKTOTAL", "CKMB", "CKMBINDEX", "TROPONINI" in the last 168 hours.  HbA1C: HbA1c POC (<> result, manual entry)  Date/Time Value Ref Range Status  09/15/2022 09:03 AM >15 4.0 - 5.6 % Final   Hgb A1c MFr Bld  Date/Time Value Ref Range Status  04/30/2022 06:37 PM 13.3 (H) 4.8 - 5.6 % Final    Comment:    (NOTE) Pre diabetes:          5.7%-6.4%  Diabetes:              >6.4%  Glycemic control for   <7.0% adults with diabetes   10/22/2021 03:10 AM >15.5 (H) 4.8 - 5.6 % Final    Comment:    (NOTE) **Verified by repeat analysis**         Prediabetes: 5.7 - 6.4         Diabetes: >6.4         Glycemic control for adults with diabetes: <7.0     CBG: Recent Labs  Lab 10/21/22 1324  GLUCAP >600*    Review of Systems:   Review of Systems  Unable to perform ROS: Critical illness   Past Medical History:  He,  has a past medical history of Diabetes mellitus without complication ([place]) and Suicidal ideation.   Surgical History:  No past surgical history on file.   Social History:   reports that he has been smoking cigarettes. He has never used smokeless tobacco. He reports current alcohol use. He reports current drug use. Drug: Marijuana.   Family History:  His family history is not on file.   Allergies No Known Allergies   Home Medications  Prior to Admission medications   Medication Sig Start Date End Date Taking? Authorizing Provider  gabapentin (NEURONTIN) 100 MG capsule Take 1 capsule (100 mg total) by mouth 3 (three) times daily. 09/15/22   Charlott Rakes, MD  insulin aspart protamine- aspart (NOVOLOG MIX 70/30) (70-30) 100 UNIT/ML injection Inject 0.2 mLs (20 Units total)  into the skin 2 (two) times daily. 09/15/22 09/15/23  Charlott Rakes, MD     Critical care time:     Erma Heritage, NP-S Cct deferred to attending

## 2022-10-21 NOTE — Progress Notes (Signed)
eLink Physician-Brief Progress Note Patient Name: Kevin Russell DOB: [birth year] MRN: [id number]   Date of Service  10/21/2022  HPI/Events of Note  Patient remains tachycardic (120 - 140's).  eICU Interventions  LR 1000 ml iv fluid bolus ordered over 2 hours.        Migdalia Dk 10/21/2022, 9:53 PM

## 2022-10-22 DIAGNOSIS — E1010 Type 1 diabetes mellitus with ketoacidosis without coma: Principal | ICD-10-CM

## 2022-10-22 LAB — BASIC METABOLIC PANEL
Anion gap: 13 (ref 5–15)
Anion gap: 14 (ref 5–15)
Anion gap: 9 (ref 5–15)
BUN: 16 mg/dL (ref 6–20)
BUN: 21 mg/dL — ABNORMAL HIGH (ref 6–20)
BUN: 27 mg/dL — ABNORMAL HIGH (ref 6–20)
CO2: 16 mmol/L — ABNORMAL LOW (ref 22–32)
CO2: 17 mmol/L — ABNORMAL LOW (ref 22–32)
CO2: 19 mmol/L — ABNORMAL LOW (ref 22–32)
Calcium: 8.2 mg/dL — ABNORMAL LOW (ref 8.9–10.3)
Calcium: 8.2 mg/dL — ABNORMAL LOW (ref 8.9–10.3)
Calcium: 8.2 mg/dL — ABNORMAL LOW (ref 8.9–10.3)
Chloride: 101 mmol/L (ref 98–111)
Chloride: 104 mmol/L (ref 98–111)
Chloride: 104 mmol/L (ref 98–111)
Creatinine, Ser: 0.88 mg/dL (ref 0.61–1.24)
Creatinine, Ser: 0.99 mg/dL (ref 0.61–1.24)
Creatinine, Ser: 1.22 mg/dL (ref 0.61–1.24)
GFR, Estimated: >60 mL/min (ref >60)
GFR, Estimated: >60 mL/min (ref >60)
GFR, Estimated: >60 mL/min (ref >60)
Glucose, Bld: 140 mg/dL — ABNORMAL HIGH (ref 70–99)
Glucose, Bld: 174 mg/dL — ABNORMAL HIGH (ref 70–99)
Glucose, Bld: 191 mg/dL — ABNORMAL HIGH (ref 70–99)
Potassium: 3.4 mmol/L — ABNORMAL LOW (ref 3.5–5.1)
Potassium: 4 mmol/L (ref 3.5–5.1)
Potassium: 4.2 mmol/L (ref 3.5–5.1)
Sodium: 131 mmol/L — ABNORMAL LOW (ref 135–145)
Sodium: 132 mmol/L — ABNORMAL LOW (ref 135–145)
Sodium: 134 mmol/L — ABNORMAL LOW (ref 135–145)

## 2022-10-22 LAB — CBC
HCT: 42.2 % (ref 39.0–52.0)
Hemoglobin: 15.6 g/dL (ref 13.0–17.0)
MCH: 30 pg (ref 26.0–34.0)
MCHC: 37 g/dL — ABNORMAL HIGH (ref 30.0–36.0)
MCV: 81.2 fL (ref 80.0–100.0)
Platelets: 200 10*3/uL (ref 150–400)
RBC: 5.2 MIL/uL (ref 4.22–5.81)
RDW: 12.8 % (ref 11.5–15.5)
WBC: 10.8 10*3/uL — ABNORMAL HIGH (ref 4.0–10.5)
nRBC: 0 % (ref 0.0–0.2)

## 2022-10-22 LAB — GLUCOSE, CAPILLARY
Glucose-Capillary: 120 mg/dL — ABNORMAL HIGH (ref 70–99)
Glucose-Capillary: 126 mg/dL — ABNORMAL HIGH (ref 70–99)
Glucose-Capillary: 156 mg/dL — ABNORMAL HIGH (ref 70–99)
Glucose-Capillary: 156 mg/dL — ABNORMAL HIGH (ref 70–99)
Glucose-Capillary: 160 mg/dL — ABNORMAL HIGH (ref 70–99)
Glucose-Capillary: 161 mg/dL — ABNORMAL HIGH (ref 70–99)
Glucose-Capillary: 164 mg/dL — ABNORMAL HIGH (ref 70–99)
Glucose-Capillary: 177 mg/dL — ABNORMAL HIGH (ref 70–99)
Glucose-Capillary: 193 mg/dL — ABNORMAL HIGH (ref 70–99)
Glucose-Capillary: 200 mg/dL — ABNORMAL HIGH (ref 70–99)

## 2022-10-22 LAB — PHOSPHORUS: Phosphorus: 1.7 mg/dL — ABNORMAL LOW (ref 2.5–4.6)

## 2022-10-22 LAB — BETA-HYDROXYBUTYRIC ACID: Beta-Hydroxybutyric Acid: 2.8 mmol/L — ABNORMAL HIGH (ref 0.05–0.27)

## 2022-10-22 LAB — MAGNESIUM: Magnesium: 1.5 mg/dL — ABNORMAL LOW (ref 1.7–2.4)

## 2022-10-22 MED ORDER — SODIUM PHOSPHATES 45 MMOLE/15ML IV SOLN
30.0000 mmol | Freq: Once | INTRAVENOUS | Status: AC
  Administered 2022-10-22: 30 mmol via INTRAVENOUS
  Filled 2022-10-22: qty 10

## 2022-10-22 MED ORDER — INSULIN DETEMIR 100 UNIT/ML ~~LOC~~ SOLN
8.0000 [IU] | Freq: Two times a day (BID) | SUBCUTANEOUS | Status: DC
  Administered 2022-10-22 – 2022-10-25 (×8): 8 [IU] via SUBCUTANEOUS
  Filled 2022-10-22 (×9): qty 0.08

## 2022-10-22 MED ORDER — MAGNESIUM SULFATE 4 GM/100ML IV SOLN
4.0000 g | Freq: Once | INTRAVENOUS | Status: AC
  Administered 2022-10-22: 4 g via INTRAVENOUS
  Filled 2022-10-22: qty 100

## 2022-10-22 MED ORDER — INSULIN ASPART 100 UNIT/ML IJ SOLN
2.0000 [IU] | INTRAMUSCULAR | Status: DC
  Administered 2022-10-22: 4 [IU] via SUBCUTANEOUS
  Administered 2022-10-22: 2 [IU] via SUBCUTANEOUS
  Administered 2022-10-22 (×2): 4 [IU] via SUBCUTANEOUS
  Administered 2022-10-23 – 2022-10-24 (×5): 2 [IU] via SUBCUTANEOUS
  Administered 2022-10-24 – 2022-10-25 (×4): 6 [IU] via SUBCUTANEOUS
  Administered 2022-10-25: 4 [IU] via SUBCUTANEOUS

## 2022-10-22 MED ORDER — POTASSIUM CHLORIDE CRYS ER 20 MEQ PO TBCR
60.0000 meq | EXTENDED_RELEASE_TABLET | Freq: Once | ORAL | Status: AC
  Administered 2022-10-22: 60 meq via ORAL
  Filled 2022-10-22: qty 3

## 2022-10-22 NOTE — Inpatient Diabetes Management (Signed)
Inpatient Diabetes Program Recommendations  AACE/ADA: New Consensus Statement on Inpatient Glycemic Control (2015)  Target Ranges:  Prepandial:   less than 140 mg/dL      Peak postprandial:   less than 180 mg/dL (1-2 hours)      Critically ill patients:  140 - 180 mg/dL   Lab Results  Component Value Date   GLUCAP 126 (H) 10/22/2022   HGBA1C >15 09/15/2022    Review of Glycemic Control  Diabetes history: DM type 1 (Makes no insulin, requires basal,meal coverage and correction) Outpatient Diabetes medications: 70/30 15 units bid Current orders for Inpatient glycemic control: Levemir 8 units bid, Novolog 2-6 units q 4 hrs.  Inpatient Diabetes Program Recommendations:   Noted transitioned to [facility] insulin. A1c of >15 on 09/15/22 was addressed @ PCP office visit on 09/15/22. When eating will need Novolog meal coverage added. Will continue to follow while inpatient.  Thank you, Billy Fischer. Ilissa Rosner, RN, MSN, CDE  Diabetes Coordinator Inpatient Glycemic Control Team Team Pager [phone] (8am-5pm) 10/22/2022 9:08 AM

## 2022-10-22 NOTE — Progress Notes (Signed)
eLink Physician-Brief Progress Note Patient Name: Kevin Russell DOB: 1985/11/04 MRN: [id number]   Date of Service  10/22/2022  HPI/Events of Note  On further review of patient's lab results patient does not meet criteria for transition from Insulin gtt.  eICU Interventions  Nursing communication sent to bedside RN to resume Insulin gtt per DKA protocol until patient meets criteria for transition off Insulin gtt.        Thomasene Lot Kevin Russell 10/22/2022, 6:55 AM

## 2022-10-22 NOTE — Progress Notes (Signed)
PROGRESS NOTE    Kevin Russell  [id number] DOB: [birth year] DOA: 10/21/2022 PCP: Pcp, No  Outpatient Specialists:     Brief Narrative:  Patient is a 36 year old male past medical history significant for poorly controlled type 1 diabetes mellitus, admitted with severe DKA and severe acidosis.  Anion gap is closed.  Acidosis has improved significantly.  Patient is off insulin drip.  Patient has been transferred to [facility] for further management.  10/22/2022: Patient seen.  Patient did not reply to my questions.  Apparently, patient has has not been communicating significantly with care team.   Assessment & Plan:   Principal Problem:   DKA (diabetic ketoacidosis) ([place]) Active Problems:   MDD (major depressive disorder), recurrent severe, without psychosis ([place])   Leukocytosis   Acute metabolic encephalopathy   Lactic acidosis   Pseudohyponatremia   Hyperkalemia, transcellular shifts   Volume depletion, unspecified   Diabetic Ketoacidosis: -Likely d/t medication noncompliance but can not r/o viral component  - Aggressive Fluid resuscitation  - IV Insulin w/ EndoTool - CBGs per EndoTool - Serial BMP  - Trend Beta-hydroxybutyric acid -Anion gap is closed. -Insulin drip has been discontinued. -Patient has been transition to subcutaneous Levemir and sliding scale insulin.   Acute Metabolic Encephalopathy: -Secondary to DKA - Supportive Care - UDS is negative. -Influenza B is positive.   AKI (baseline 0.8) > likely d/t dehydration in the setting of DKA -Likely secondary to volume depletion. -AKI has resolved.  Serum creatinine is back to baseline.     Chronic Neuropathy Plan - On gabapentin at home, will resume when renal and mentation status improves.   DVT prophylaxis: Subcutaneous Lovenox Code Status: Full code Family Communication:  Disposition Plan: Home eventually   Consultants:  Transferred from ICU to [facility] today.  Procedures:  None  Antimicrobials:   None   Subjective: Patient will not speak to me.  Objective: Vitals:   10/22/22 1300 10/22/22 1439 10/22/22 1633 10/22/22 1634  BP: 102/66 91/60 103/69 103/69  Pulse: (!) 126   (!) 120  Resp: 18 20  18   Temp:  98.6 F (37 C)  99.1 F (37.3 C)  TempSrc:  Oral  Oral  SpO2: 96% 98%  96%  Weight:      Height:        Intake/Output Summary (Last 24 hours) at 10/22/2022 1747 Last data filed at 10/22/2022 1430 Gross per 24 hour  Intake 3181.26 ml  Output 3000 ml  Net 181.26 ml   Filed Weights   10/21/22 0934 10/21/22 1825  Weight: 68 kg 59.1 kg    Examination:  General exam: Appears calm and comfortable  Not very cooperative.  Data Reviewed: I have personally reviewed following labs and imaging studies  CBC: Recent Labs  Lab 10/21/22 0943 10/21/22 1120 10/21/22 1342 10/21/22 1600 10/22/22 0359  WBC 21.5*  --   --  15.0* 10.8*  HGB 17.3* 18.4* 17.7* 15.6 15.6  HCT 51.7 54.0* 52.0 43.9 42.2  MCV 90.7  --   --  83.1 81.2  PLT 356  --   --  258 [id number]   Basic Metabolic Panel: Recent Labs  Lab 10/21/22 1600 10/21/22 1937 10/21/22 2341 10/22/22 0359 10/22/22 0846  NA 125* 126* 131* 134* 132*  K 4.6 4.8 4.2 4.0 3.4*  CL 98 97* 101 104 104  CO2 8* 10* 17* 16* 19*  GLUCOSE 305* 187* 191* 174* 140*  BUN 44* 38* 27* 21* 16  CREATININE 1.68* 1.50* 1.22 0.99 0.88  CALCIUM 7.8* 8.5* 8.2* 8.2* 8.2*  MG  --   --   --  1.5*  --   PHOS  --   --   --  1.7*  --    GFR: Estimated Creatinine Clearance: 95.1 mL/min (by C-G formula based on SCr of 0.88 mg/dL). Liver Function Tests: Recent Labs  Lab 10/21/22 0943  AST 38  ALT 23  ALKPHOS 107  BILITOT 1.5*  PROT 7.0  ALBUMIN 3.8   Recent Labs  Lab 10/21/22 0943  LIPASE 22   No results for input(s): "AMMONIA" in the last 168 hours. Coagulation Profile: No results for input(s): "INR", "PROTIME" in the last 168 hours. Cardiac Enzymes: No results for input(s): "CKTOTAL", "CKMB", "CKMBINDEX", "TROPONINI" in the  last 168 hours. BNP (last 3 results) No results for input(s): "PROBNP" in the last 8760 hours. HbA1C: No results for input(s): "HGBA1C" in the last 72 hours. CBG: Recent Labs  Lab 10/22/22 0409 10/22/22 0505 10/22/22 0723 10/22/22 1153 10/22/22 1634  GLUCAP 156* 160* 126* 177* 156*   Lipid Profile: No results for input(s): "CHOL", "HDL", "LDLCALC", "TRIG", "CHOLHDL", "LDLDIRECT" in the last 72 hours. Thyroid Function Tests: No results for input(s): "TSH", "T4TOTAL", "FREET4", "T3FREE", "THYROIDAB" in the last 72 hours. Anemia Panel: No results for input(s): "VITAMINB12", "FOLATE", "FERRITIN", "TIBC", "IRON", "RETICCTPCT" in the last 72 hours. Urine analysis:    Component Value Date/Time   COLORURINE YELLOW 10/21/2022 0943   APPEARANCEUR HAZY (A) 10/21/2022 0943   LABSPEC 1.017 10/21/2022 0943   PHURINE 5.0 10/21/2022 0943   GLUCOSEU >=500 (A) 10/21/2022 0943   HGBUR MODERATE (A) 10/21/2022 0943   BILIRUBINUR NEGATIVE 10/21/2022 0943   KETONESUR 80 (A) 10/21/2022 0943   PROTEINUR 30 (A) 10/21/2022 0943   UROBILINOGEN 0.2 06/01/2012 0916   NITRITE NEGATIVE 10/21/2022 0943   LEUKOCYTESUR NEGATIVE 10/21/2022 0943   Sepsis Labs: @LABRCNTIP (procalcitonin:4,lacticidven:4)  ) Recent Results (from the past 240 hour(s))  Blood culture (routine x 2)     Status: None (Preliminary result)   Collection Time: 10/21/22 11:03 AM   Specimen: BLOOD  Result Value Ref Range Status   Specimen Description BLOOD SITE NOT SPECIFIED  Final   Special Requests   Final    BOTTLES DRAWN AEROBIC AND ANAEROBIC Blood Culture adequate volume   Culture   Final    NO GROWTH < 24 HOURS Performed at [facility], [address], [place], [place]    Report Status PENDING  Incomplete  Respiratory (~20 pathogens) panel by PCR     Status: Abnormal   Collection Time: 10/21/22  2:42 PM   Specimen: Nasopharyngeal Swab; Respiratory  Result Value Ref Range Status   Adenovirus NOT DETECTED  NOT DETECTED Final   Coronavirus 229E NOT DETECTED NOT DETECTED Final    Comment: (NOTE) The Coronavirus on the Respiratory Panel, DOES NOT test for the novel  Coronavirus (2019 nCoV)    Coronavirus HKU1 NOT DETECTED NOT DETECTED Final   Coronavirus NL63 NOT DETECTED NOT DETECTED Final   Coronavirus OC43 NOT DETECTED NOT DETECTED Final   Metapneumovirus NOT DETECTED NOT DETECTED Final   Rhinovirus / Enterovirus NOT DETECTED NOT DETECTED Final   Influenza A NOT DETECTED NOT DETECTED Final   Influenza B DETECTED (A) NOT DETECTED Final   Parainfluenza Virus 1 NOT DETECTED NOT DETECTED Final   Parainfluenza Virus 2 NOT DETECTED NOT DETECTED Final   Parainfluenza Virus 3 NOT DETECTED NOT DETECTED Final   Parainfluenza Virus 4 NOT DETECTED NOT  DETECTED Final   Respiratory Syncytial Virus NOT DETECTED NOT DETECTED Final   Bordetella pertussis NOT DETECTED NOT DETECTED Final   Bordetella Parapertussis NOT DETECTED NOT DETECTED Final   Chlamydophila pneumoniae NOT DETECTED NOT DETECTED Final   Mycoplasma pneumoniae NOT DETECTED NOT DETECTED Final    Comment: Performed at [facility], [place] [address] [place], [place] [address]  MRSA Next Gen by PCR, Nasal     Status: None   Collection Time: 10/21/22  6:58 PM   Specimen: Nasal Mucosa; Nasal Swab  Result Value Ref Range Status   MRSA by PCR Next Gen NOT DETECTED NOT DETECTED Final    Comment: (NOTE) The GeneXpert MRSA Assay (FDA approved for NASAL specimens only), is one component of a comprehensive MRSA colonization surveillance program. It is not intended to diagnose MRSA infection nor to guide or monitor treatment for MRSA infections. Test performance is not FDA approved in patients less than 76 years old. Performed at [facility], [place] [address], [place], [place] [address]          Radiology Studies: DG Chest Port 1 View  Result Date: 10/21/2022 CLINICAL DATA:  Weakness EXAM: PORTABLE CHEST 1 VIEW COMPARISON:   10/21/2021 FINDINGS: Cardiac size is within normal limits. There are no signs of pulmonary edema. Increased interstitial markings are seen in the lower lung fields, more so on the left side. There is no pleural effusion or pneumothorax. IMPRESSION: Increased interstitial markings are seen in the lower lung fields, more so on the left side. This may suggest crowding of bronchovascular structures due to poor inspiration or early pneumonia. Electronically Signed   By: Elmer Picker M.D.   On: 10/21/2022 13:27        Scheduled Meds:  enoxaparin (LOVENOX) injection  40 mg Subcutaneous Q24H   insulin aspart  2-6 Units Subcutaneous Q4H   insulin detemir  8 Units Subcutaneous Q12H   Continuous Infusions:  acetaminophen     dextrose 5% lactated ringers Stopped (10/22/22 0509)     LOS: 1 day    Time spent: 55 minutes.    Dana Allan, MD  [facility] Pager #: [phone] 7PM-7AM contact night coverage as above

## 2022-10-22 NOTE — Plan of Care (Signed)
°  Problem: Clinical Measurements: °Goal: Respiratory complications will improve °Outcome: Progressing °Goal: Cardiovascular complication will be avoided °Outcome: Progressing °  °Problem: Activity: °Goal: Risk for activity intolerance will decrease °Outcome: Progressing °  °

## 2022-10-22 NOTE — Progress Notes (Signed)
Patient transferred with all his belongings to included clothing, clack tennis shoes, cell phone, charger and wallet

## 2022-10-22 NOTE — Progress Notes (Signed)
[facility] ADULT ICU REPLACEMENT PROTOCOL   The patient does apply for the [facility] Adult ICU Electrolyte Replacment Protocol based on the criteria listed below:   1.Exclusion criteria: TCTS, ECMO, Dialysis, and Myasthenia Gravis patients 2. Is GFR >/= 30 ml/min? Yes.    Patient's GFR today is >60 3. Is SCr </= 2? Yes.   Patient's SCr is 0.99 mg/dL 4. Did SCr increase >/= 0.5 in 24 hours? No. 5.Pt's weight >40kg  Yes.   6. Abnormal electrolyte(s):   Mg 1.5, Phos 1.7  7. Electrolytes replaced per protocol 8.  Call MD STAT for K+ </= 2.5, Phos </= 1, or Mag </= 1 Physician:  Shawn Stall R Gerell Fortson 10/22/2022 6:08 AM

## 2022-10-22 NOTE — Progress Notes (Signed)
eLink Physician-Brief Progress Note Patient Name: Kevin Russell DOB: 1986-09-24 MRN: [id number]   Date of Service  10/22/2022  HPI/Events of Note  Patient closed his gap and is ready to transition off Insulin gtt per Endotool.  eICU Interventions  Insulin transition protocol orders placed.        Thomasene Lot Kevin Russell 10/22/2022, 1:26 AM

## 2022-10-23 LAB — GLUCOSE, CAPILLARY
Glucose-Capillary: 121 mg/dL — ABNORMAL HIGH (ref 70–99)
Glucose-Capillary: 129 mg/dL — ABNORMAL HIGH (ref 70–99)
Glucose-Capillary: 130 mg/dL — ABNORMAL HIGH (ref 70–99)
Glucose-Capillary: 138 mg/dL — ABNORMAL HIGH (ref 70–99)
Glucose-Capillary: 174 mg/dL — ABNORMAL HIGH (ref 70–99)
Glucose-Capillary: 99 mg/dL (ref 70–99)

## 2022-10-23 LAB — BASIC METABOLIC PANEL
Anion gap: 11 (ref 5–15)
BUN: 11 mg/dL (ref 6–20)
CO2: 21 mmol/L — ABNORMAL LOW (ref 22–32)
Calcium: 8 mg/dL — ABNORMAL LOW (ref 8.9–10.3)
Chloride: 101 mmol/L (ref 98–111)
Creatinine, Ser: 0.81 mg/dL (ref 0.61–1.24)
GFR, Estimated: >60 mL/min (ref >60)
Glucose, Bld: 127 mg/dL — ABNORMAL HIGH (ref 70–99)
Potassium: 3.5 mmol/L (ref 3.5–5.1)
Sodium: 133 mmol/L — ABNORMAL LOW (ref 135–145)

## 2022-10-23 LAB — MAGNESIUM: Magnesium: 2.1 mg/dL (ref 1.7–2.4)

## 2022-10-23 LAB — PHOSPHORUS: Phosphorus: 1.7 mg/dL — ABNORMAL LOW (ref 2.5–4.6)

## 2022-10-23 MED ORDER — POTASSIUM PHOSPHATES 15 MMOLE/5ML IV SOLN
15.0000 mmol | Freq: Once | INTRAVENOUS | Status: AC
  Administered 2022-10-23: 15 mmol via INTRAVENOUS
  Filled 2022-10-23: qty 5

## 2022-10-23 NOTE — Progress Notes (Incomplete)
PROGRESS NOTE    Kevin Russell  YQI:[id number] DOB: 02-07-1986 DOA: 10/21/2022 PCP: Pcp, No  Outpatient Specialists:     Brief Narrative:  Patient is a 36 year old male past medical history significant for poorly controlled type 1 diabetes mellitus, admitted with severe DKA and severe acidosis.  Anion gap is closed.  Acidosis has improved significantly.  Patient is off insulin drip.  Patient has been transferred to [facility] for further management.  10/22/2022: Patient seen.  Patient did not reply to my questions.  Apparently, patient has has not been communicating significantly with care team.   Assessment & Plan:   Principal Problem:   DKA (diabetic ketoacidosis) ([facility]) Active Problems:   MDD (major depressive disorder), recurrent severe, without psychosis ([facility])   Leukocytosis   Acute metabolic encephalopathy   Lactic acidosis   Pseudohyponatremia   Hyperkalemia, transcellular shifts   Volume depletion, unspecified   Diabetic Ketoacidosis: -Likely d/t medication noncompliance but can not r/o viral component  - Aggressive Fluid resuscitation  - IV Insulin w/ EndoTool - CBGs per EndoTool - Serial BMP  - Trend Beta-hydroxybutyric acid -Anion gap is closed. -Insulin drip has been discontinued. -Patient has been transition to subcutaneous Levemir and sliding scale insulin.   Acute Metabolic Encephalopathy: -Secondary to DKA - Supportive Care - UDS is negative. -Influenza B is positive.   AKI (baseline 0.8) > likely d/t dehydration in the setting of DKA -Likely secondary to volume depletion. -AKI has resolved.  Serum creatinine is back to baseline.     Chronic Neuropathy Plan - On gabapentin at home, will resume when renal and mentation status improves.   DVT prophylaxis: Subcutaneous Lovenox Code Status: Full code Family Communication:  Disposition Plan: Home eventually   Consultants:  Transferred from ICU to [facility] today.  Procedures:  None  Antimicrobials:   None   Subjective: Patient will not speak to me.  Objective: Vitals:   10/23/22 0423 10/23/22 0820 10/23/22 1150 10/23/22 1605  BP: 111/67 100/75 103/70 102/71  Pulse: (!) 124 (!) 117 (!) 114 (!) 101  Resp: 20 14 (!) 22 15  Temp: 99.8 F (37.7 C) 99 F (37.2 C) 99 F (37.2 C) 98.8 F (37.1 C)  TempSrc: Oral Oral Oral Oral  SpO2: 95% 97% 97% 97%  Weight: 58.6 kg     Height:        Intake/Output Summary (Last 24 hours) at 10/23/2022 1711 Last data filed at 10/23/2022 1607 Gross per 24 hour  Intake 157.76 ml  Output --  Net 157.76 ml    Filed Weights   10/21/22 0934 10/21/22 1825 10/23/22 0423  Weight: 68 kg 59.1 kg 58.6 kg    Examination:  General exam: Appears calm and comfortable  Not very cooperative.  Data Reviewed: I have personally reviewed following labs and imaging studies  CBC: Recent Labs  Lab 10/21/22 0943 10/21/22 1120 10/21/22 1342 10/21/22 1600 10/22/22 0359  WBC 21.5*  --   --  15.0* 10.8*  HGB 17.3* 18.4* 17.7* 15.6 15.6  HCT 51.7 54.0* 52.0 43.9 42.2  MCV 90.7  --   --  83.1 81.2  PLT 356  --   --  258 200    Basic Metabolic Panel: Recent Labs  Lab 10/21/22 1937 10/21/22 2341 10/22/22 0359 10/22/22 0846 10/23/22 0105  NA 126* 131* 134* 132* 133*  K 4.8 4.2 4.0 3.4* 3.5  CL 97* 101 104 104 101  CO2 10* 17* 16* 19* 21*  GLUCOSE 187* 191* 174* 140* 127*  BUN 38* 27* 21* 16 11  CREATININE 1.50* 1.22 0.99 0.88 0.81  CALCIUM 8.5* 8.2* 8.2* 8.2* 8.0*  MG  --   --  1.5*  --  2.1  PHOS  --   --  1.7*  --  1.7*    GFR: Estimated Creatinine Clearance: 102.5 mL/min (by C-G formula based on SCr of 0.81 mg/dL). Liver Function Tests: Recent Labs  Lab 10/21/22 0943  AST 38  ALT 23  ALKPHOS 107  BILITOT 1.5*  PROT 7.0  ALBUMIN 3.8    Recent Labs  Lab 10/21/22 0943  LIPASE 22    No results for input(s): "AMMONIA" in the last 168 hours. Coagulation Profile: No results for input(s): "INR", "PROTIME" in the last 168  hours. Cardiac Enzymes: No results for input(s): "CKTOTAL", "CKMB", "CKMBINDEX", "TROPONINI" in the last 168 hours. BNP (last 3 results) No results for input(s): "PROBNP" in the last 8760 hours. HbA1C: No results for input(s): "HGBA1C" in the last 72 hours. CBG: Recent Labs  Lab 10/23/22 0013 10/23/22 0403 10/23/22 0819 10/23/22 1149 10/23/22 1606  GLUCAP 138* 130* 121* 99 129*    Lipid Profile: No results for input(s): "CHOL", "HDL", "LDLCALC", "TRIG", "CHOLHDL", "LDLDIRECT" in the last 72 hours. Thyroid Function Tests: No results for input(s): "TSH", "T4TOTAL", "FREET4", "T3FREE", "THYROIDAB" in the last 72 hours. Anemia Panel: No results for input(s): "VITAMINB12", "FOLATE", "FERRITIN", "TIBC", "IRON", "RETICCTPCT" in the last 72 hours. Urine analysis:    Component Value Date/Time   COLORURINE YELLOW 10/21/2022 0943   APPEARANCEUR HAZY (A) 10/21/2022 0943   LABSPEC 1.017 10/21/2022 0943   PHURINE 5.0 10/21/2022 0943   GLUCOSEU >=500 (A) 10/21/2022 0943   HGBUR MODERATE (A) 10/21/2022 0943   BILIRUBINUR NEGATIVE 10/21/2022 0943   KETONESUR 80 (A) 10/21/2022 0943   PROTEINUR 30 (A) 10/21/2022 0943   UROBILINOGEN 0.2 06/01/2012 0916   NITRITE NEGATIVE 10/21/2022 0943   LEUKOCYTESUR NEGATIVE 10/21/2022 0943   Sepsis Labs: @LABRCNTIP (procalcitonin:4,lacticidven:4)  ) Recent Results (from the past 240 hour(s))  Blood culture (routine x 2)     Status: None (Preliminary result)   Collection Time: 10/21/22 11:03 AM   Specimen: BLOOD  Result Value Ref Range Status   Specimen Description BLOOD SITE NOT SPECIFIED  Final   Special Requests   Final    BOTTLES DRAWN AEROBIC AND ANAEROBIC Blood Culture adequate volume   Culture   Final    NO GROWTH 2 DAYS Performed at [facility], [address] [place], [place]    Report Status PENDING  Incomplete  Respiratory (~20 pathogens) panel by PCR     Status: Abnormal   Collection Time: 10/21/22  2:42 PM    Specimen: Nasopharyngeal Swab; Respiratory  Result Value Ref Range Status   Adenovirus NOT DETECTED NOT DETECTED Final   Coronavirus 229E NOT DETECTED NOT DETECTED Final    Comment: (NOTE) The Coronavirus on the Respiratory Panel, DOES NOT test for the novel  Coronavirus (2019 nCoV)    Coronavirus HKU1 NOT DETECTED NOT DETECTED Final   Coronavirus NL63 NOT DETECTED NOT DETECTED Final   Coronavirus OC43 NOT DETECTED NOT DETECTED Final   Metapneumovirus NOT DETECTED NOT DETECTED Final   Rhinovirus / Enterovirus NOT DETECTED NOT DETECTED Final   Influenza A NOT DETECTED NOT DETECTED Final   Influenza B DETECTED (A) NOT DETECTED Final   Parainfluenza Virus 1 NOT DETECTED NOT DETECTED Final   Parainfluenza Virus 2 NOT DETECTED NOT DETECTED Final   Parainfluenza Virus 3  NOT DETECTED NOT DETECTED Final   Parainfluenza Virus 4 NOT DETECTED NOT DETECTED Final   Respiratory Syncytial Virus NOT DETECTED NOT DETECTED Final   Bordetella pertussis NOT DETECTED NOT DETECTED Final   Bordetella Parapertussis NOT DETECTED NOT DETECTED Final   Chlamydophila pneumoniae NOT DETECTED NOT DETECTED Final   Mycoplasma pneumoniae NOT DETECTED NOT DETECTED Final    Comment: Performed at [facility], [address] [place], [place] [address]  MRSA Next Gen by PCR, Nasal     Status: None   Collection Time: 10/21/22  6:58 PM   Specimen: Nasal Mucosa; Nasal Swab  Result Value Ref Range Status   MRSA by PCR Next Gen NOT DETECTED NOT DETECTED Final    Comment: (NOTE) The GeneXpert MRSA Assay (FDA approved for NASAL specimens only), is one component of a comprehensive MRSA colonization surveillance program. It is not intended to diagnose MRSA infection nor to guide or monitor treatment for MRSA infections. Test performance is not FDA approved in patients less than 4 years old. Performed at [facility], [address], [place], [place] [address]          Radiology Studies: No results  found.      Scheduled Meds:  enoxaparin (LOVENOX) injection  40 mg Subcutaneous Q24H   insulin aspart  2-6 Units Subcutaneous Q4H   insulin detemir  8 Units Subcutaneous Q12H   Continuous Infusions:  dextrose 5% lactated ringers Stopped (10/22/22 0509)   potassium PHOSPHATE IVPB (in mmol) 15 mmol (10/23/22 1202)     LOS: 2 days    Time spent: 55 minutes.    Berton Mount, MD  [facility] Pager #: [phone] 7PM-7AM contact night coverage as above

## 2022-10-23 NOTE — Progress Notes (Signed)
Kevin Russell  HAL:[id number] DOB: Apr 29, 1986 DOA: 10/21/2022 PCP: Pcp, No  Outpatient Specialists:     Brief Narrative:  Patient is a 36 year old male past medical history significant for poorly controlled type 1 diabetes mellitus, admitted with severe DKA and severe acidosis.  Anion gap is closed.  Acidosis has improved significantly.  Patient is off insulin drip.  Patient has been transferred to [facility] for further management.  10/22/2022: Patient seen.  Patient did not reply to my questions.  Apparently, patient has has not been communicating significantly with care team. 10/23/2022: Patient seen.  Patient looks much better today.  Blood sugar is better controlled.  Will continue to monitor renal function and electrolytes.  Likely discharge tomorrow.   Assessment & Plan:   Principal Problem:   DKA (diabetic ketoacidosis) (HCC) Active Problems:   MDD (major depressive disorder), recurrent severe, without psychosis (HCC)   Leukocytosis   Acute metabolic encephalopathy   Lactic acidosis   Pseudohyponatremia   Hyperkalemia, transcellular shifts   Volume depletion, unspecified   Diabetic Ketoacidosis: -Likely d/t medication noncompliance but can not r/o viral component  - Aggressive Fluid resuscitation  - IV Insulin w/ EndoTool - CBGs per EndoTool - Serial BMP  - Trend Beta-hydroxybutyric acid -Anion gap is closed. -Insulin drip has been discontinued. -Patient has been transition to subcutaneous Levemir and sliding scale insulin. 10/23/2022: DKA has resolved.  Electrolytes are better controlled.  Advance diet.  Likely discharge tomorrow.  Will consult TOC team to assist with discharge.   Acute Metabolic Encephalopathy: -Secondary to DKA - Supportive Care - UDS is negative. -Influenza B is positive. 10/23/2022: Resolved significantly.   AKI (baseline 0.8) > likely d/t dehydration in the setting of DKA -Likely secondary to volume depletion. -AKI has  resolved.  Serum creatinine is back to baseline.     Chronic Neuropathy Plan - On gabapentin at home, will resume when renal and mentation status improves.   DVT prophylaxis: Subcutaneous Lovenox Code Status: Full code Family Communication:  Disposition Plan: Home eventually   Consultants:  Transferred from ICU to [facility] today.  Procedures:  None  Antimicrobials:  None   Subjective: Patient seen.  No new complaints.  Objective: Vitals:   10/23/22 0423 10/23/22 0820 10/23/22 1150 10/23/22 1605  BP: 111/67 100/75 103/70 102/71  Pulse: (!) 124 (!) 117 (!) 114 (!) 101  Resp: 20 14 (!) 22 15  Temp: 99.8 F (37.7 C) 99 F (37.2 C) 99 F (37.2 C) 98.8 F (37.1 C)  TempSrc: Oral Oral Oral Oral  SpO2: 95% 97% 97% 97%  Weight: 58.6 kg     Height:        Intake/Output Summary (Last 24 hours) at 10/23/2022 1930 Last data filed at 10/23/2022 1607 Gross per 24 hour  Intake 157.76 ml  Output --  Net 157.76 ml    Filed Weights   10/21/22 0934 10/21/22 1825 10/23/22 0423  Weight: 68 kg 59.1 kg 58.6 kg    Examination:  General exam: Appears calm and comfortable  HEENT: No pallor. Neck: Supple. Lungs: Clear to auscultation. CVS: S1-S2. Abdomen: Soft and nontender. Neuro: Awake and alert.  Patient moves all extremities. Extremities: No leg edema.  Data Reviewed: I have personally reviewed following labs and imaging studies  CBC: Recent Labs  Lab 10/21/22 0943 10/21/22 1120 10/21/22 1342 10/21/22 1600 10/22/22 0359  WBC 21.5*  --   --  15.0* 10.8*  HGB 17.3* 18.4* 17.7* 15.6 15.6  HCT 51.7  54.0* 52.0 43.9 42.2  MCV 90.7  --   --  83.1 81.2  PLT 356  --   --  258 200    Basic Metabolic Panel: Recent Labs  Lab 10/21/22 1937 10/21/22 2341 10/22/22 0359 10/22/22 0846 10/23/22 0105  NA 126* 131* 134* 132* 133*  K 4.8 4.2 4.0 3.4* 3.5  CL 97* 101 104 104 101  CO2 10* 17* 16* 19* 21*  GLUCOSE 187* 191* 174* 140* 127*  BUN 38* 27* 21* 16 11  CREATININE  1.50* 1.22 0.99 0.88 0.81  CALCIUM 8.5* 8.2* 8.2* 8.2* 8.0*  MG  --   --  1.5*  --  2.1  PHOS  --   --  1.7*  --  1.7*    GFR: Estimated Creatinine Clearance: 102.5 mL/min (by C-G formula based on SCr of 0.81 mg/dL). Liver Function Tests: Recent Labs  Lab 10/21/22 0943  AST 38  ALT 23  ALKPHOS 107  BILITOT 1.5*  PROT 7.0  ALBUMIN 3.8    Recent Labs  Lab 10/21/22 0943  LIPASE 22    No results for input(s): "AMMONIA" in the last 168 hours. Coagulation Profile: No results for input(s): "INR", "PROTIME" in the last 168 hours. Cardiac Enzymes: No results for input(s): "CKTOTAL", "CKMB", "CKMBINDEX", "TROPONINI" in the last 168 hours. BNP (last 3 results) No results for input(s): "PROBNP" in the last 8760 hours. HbA1C: No results for input(s): "HGBA1C" in the last 72 hours. CBG: Recent Labs  Lab 10/23/22 0013 10/23/22 0403 10/23/22 0819 10/23/22 1149 10/23/22 1606  GLUCAP 138* 130* 121* 99 129*    Lipid Profile: No results for input(s): "CHOL", "HDL", "LDLCALC", "TRIG", "CHOLHDL", "LDLDIRECT" in the last 72 hours. Thyroid Function Tests: No results for input(s): "TSH", "T4TOTAL", "FREET4", "T3FREE", "THYROIDAB" in the last 72 hours. Anemia Panel: No results for input(s): "VITAMINB12", "FOLATE", "FERRITIN", "TIBC", "IRON", "RETICCTPCT" in the last 72 hours. Urine analysis:    Component Value Date/Time   COLORURINE YELLOW 10/21/2022 0943   APPEARANCEUR HAZY (A) 10/21/2022 0943   LABSPEC 1.017 10/21/2022 0943   PHURINE 5.0 10/21/2022 0943   GLUCOSEU >=500 (A) 10/21/2022 0943   HGBUR MODERATE (A) 10/21/2022 0943   BILIRUBINUR NEGATIVE 10/21/2022 0943   KETONESUR 80 (A) 10/21/2022 0943   PROTEINUR 30 (A) 10/21/2022 0943   UROBILINOGEN 0.2 06/01/2012 0916   NITRITE NEGATIVE 10/21/2022 0943   LEUKOCYTESUR NEGATIVE 10/21/2022 0943   Sepsis Labs: @LABRCNTIP (procalcitonin:4,lacticidven:4)  ) Recent Results (from the past 240 hour(s))  Blood culture (routine x  2)     Status: None (Preliminary result)   Collection Time: 10/21/22 11:03 AM   Specimen: BLOOD  Result Value Ref Range Status   Specimen Description BLOOD SITE NOT SPECIFIED  Final   Special Requests   Final    BOTTLES DRAWN AEROBIC AND ANAEROBIC Blood Culture adequate volume   Culture   Final    NO GROWTH 2 DAYS Performed at [facility], [address] [place], [place]    Report Status PENDING  Incomplete  Respiratory (~20 pathogens) panel by PCR     Status: Abnormal   Collection Time: 10/21/22  2:42 PM   Specimen: Nasopharyngeal Swab; Respiratory  Result Value Ref Range Status   Adenovirus NOT DETECTED NOT DETECTED Final   Coronavirus 229E NOT DETECTED NOT DETECTED Final    Comment: (NOTE) The Coronavirus on the Respiratory Panel, DOES NOT test for the novel  Coronavirus (2019 nCoV)    Coronavirus HKU1 NOT DETECTED  NOT DETECTED Final   Coronavirus NL63 NOT DETECTED NOT DETECTED Final   Coronavirus OC43 NOT DETECTED NOT DETECTED Final   Metapneumovirus NOT DETECTED NOT DETECTED Final   Rhinovirus / Enterovirus NOT DETECTED NOT DETECTED Final   Influenza A NOT DETECTED NOT DETECTED Final   Influenza B DETECTED (A) NOT DETECTED Final   Parainfluenza Virus 1 NOT DETECTED NOT DETECTED Final   Parainfluenza Virus 2 NOT DETECTED NOT DETECTED Final   Parainfluenza Virus 3 NOT DETECTED NOT DETECTED Final   Parainfluenza Virus 4 NOT DETECTED NOT DETECTED Final   Respiratory Syncytial Virus NOT DETECTED NOT DETECTED Final   Bordetella pertussis NOT DETECTED NOT DETECTED Final   Bordetella Parapertussis NOT DETECTED NOT DETECTED Final   Chlamydophila pneumoniae NOT DETECTED NOT DETECTED Final   Mycoplasma pneumoniae NOT DETECTED NOT DETECTED Final    Comment: Performed at [facility], [address], [place], [place] [address]  MRSA Next Gen by PCR, Nasal     Status: None   Collection Time: 10/21/22  6:58 PM   Specimen: Nasal Mucosa; Nasal Swab  Result Value  Ref Range Status   MRSA by PCR Next Gen NOT DETECTED NOT DETECTED Final    Comment: (NOTE) The GeneXpert MRSA Assay (FDA approved for NASAL specimens only), is one component of a comprehensive MRSA colonization surveillance program. It is not intended to diagnose MRSA infection nor to guide or monitor treatment for MRSA infections. Test performance is not FDA approved in patients less than 44 years old. Performed at [facility], [address] [place], [place] [address]          Radiology Studies: No results found.      Scheduled Meds:  enoxaparin (LOVENOX) injection  40 mg Subcutaneous Q24H   insulin aspart  2-6 Units Subcutaneous Q4H   insulin detemir  8 Units Subcutaneous Q12H   Continuous Infusions:  dextrose 5% lactated ringers Stopped (10/22/22 0509)     LOS: 2 days    Time spent: 35 minutes.    Berton Mount, MD  [facility] Pager #: [phone] 7PM-7AM contact night coverage as above

## 2022-10-24 LAB — RENAL FUNCTION PANEL
Albumin: 3 g/dL — ABNORMAL LOW (ref 3.5–5.0)
Anion gap: 11 (ref 5–15)
BUN: 10 mg/dL (ref 6–20)
CO2: 29 mmol/L (ref 22–32)
Calcium: 8.2 mg/dL — ABNORMAL LOW (ref 8.9–10.3)
Chloride: 94 mmol/L — ABNORMAL LOW (ref 98–111)
Creatinine, Ser: 0.67 mg/dL (ref 0.61–1.24)
GFR, Estimated: >60 mL/min (ref >60)
Glucose, Bld: 142 mg/dL — ABNORMAL HIGH (ref 70–99)
Phosphorus: 1.7 mg/dL — ABNORMAL LOW (ref 2.5–4.6)
Potassium: 3 mmol/L — ABNORMAL LOW (ref 3.5–5.1)
Sodium: 134 mmol/L — ABNORMAL LOW (ref 135–145)

## 2022-10-24 LAB — GLUCOSE, CAPILLARY
Glucose-Capillary: 100 mg/dL — ABNORMAL HIGH (ref 70–99)
Glucose-Capillary: 107 mg/dL — ABNORMAL HIGH (ref 70–99)
Glucose-Capillary: 140 mg/dL — ABNORMAL HIGH (ref 70–99)
Glucose-Capillary: 240 mg/dL — ABNORMAL HIGH (ref 70–99)
Glucose-Capillary: 241 mg/dL — ABNORMAL HIGH (ref 70–99)
Glucose-Capillary: 96 mg/dL (ref 70–99)

## 2022-10-24 LAB — MAGNESIUM: Magnesium: 2.3 mg/dL (ref 1.7–2.4)

## 2022-10-24 MED ORDER — POTASSIUM PHOSPHATES 15 MMOLE/5ML IV SOLN
30.0000 mmol | Freq: Once | INTRAVENOUS | Status: AC
  Administered 2022-10-24: 30 mmol via INTRAVENOUS
  Filled 2022-10-24: qty 10

## 2022-10-24 MED ORDER — SODIUM CHLORIDE 0.9 % IV SOLN: INTRAVENOUS | Status: AC

## 2022-10-24 MED ORDER — POTASSIUM CHLORIDE IN NACL 20-0.9 MEQ/L-% IV SOLN
INTRAVENOUS | Status: DC
  Filled 2022-10-24 (×3): qty 1000

## 2022-10-24 MED ORDER — POTASSIUM CHLORIDE CRYS ER 20 MEQ PO TBCR
40.0000 meq | EXTENDED_RELEASE_TABLET | ORAL | Status: AC
  Administered 2022-10-24 (×2): 40 meq via ORAL
  Filled 2022-10-24 (×2): qty 2

## 2022-10-24 MED ORDER — POTASSIUM & SODIUM PHOSPHATES 280-160-250 MG PO PACK
1.0000 | PACK | Freq: Three times a day (TID) | ORAL | Status: AC
  Administered 2022-10-24 – 2022-10-25 (×4): 1 via ORAL
  Filled 2022-10-24 (×4): qty 1

## 2022-10-25 LAB — MAGNESIUM: Magnesium: 2 mg/dL (ref 1.7–2.4)

## 2022-10-25 LAB — RENAL FUNCTION PANEL
Albumin: 2.5 g/dL — ABNORMAL LOW (ref 3.5–5.0)
Anion gap: 11 (ref 5–15)
BUN: 8 mg/dL (ref 6–20)
CO2: 26 mmol/L (ref 22–32)
Calcium: 7.6 mg/dL — ABNORMAL LOW (ref 8.9–10.3)
Chloride: 96 mmol/L — ABNORMAL LOW (ref 98–111)
Creatinine, Ser: 0.64 mg/dL (ref 0.61–1.24)
GFR, Estimated: >60 mL/min
Glucose, Bld: 280 mg/dL — ABNORMAL HIGH (ref 70–99)
Phosphorus: 2.9 mg/dL (ref 2.5–4.6)
Potassium: 3.8 mmol/L (ref 3.5–5.1)
Sodium: 133 mmol/L — ABNORMAL LOW (ref 135–145)

## 2022-10-25 LAB — GLUCOSE, CAPILLARY
Glucose-Capillary: 151 mg/dL — ABNORMAL HIGH (ref 70–99)
Glucose-Capillary: 182 mg/dL — ABNORMAL HIGH (ref 70–99)
Glucose-Capillary: 193 mg/dL — ABNORMAL HIGH (ref 70–99)
Glucose-Capillary: 205 mg/dL — ABNORMAL HIGH (ref 70–99)
Glucose-Capillary: 272 mg/dL — ABNORMAL HIGH (ref 70–99)
Glucose-Capillary: 73 mg/dL (ref 70–99)

## 2022-10-25 NOTE — Plan of Care (Signed)

## 2022-10-25 NOTE — Discharge Summary (Signed)
Physician Discharge Summary  Patient ID: CASANOVA SCHURMAN MRN: [id number] DOB/AGE: 12/16/1985 37 y.o.  Admit date: 10/21/2022 Discharge date: 10/25/2022  Admission Diagnoses:  Discharge Diagnoses:  Principal Problem:   DKA (diabetic ketoacidosis) ([place]) Active Problems:   MDD (major depressive disorder), recurrent severe, without psychosis ([place])   Leukocytosis   Acute metabolic encephalopathy   Lactic acidosis   Pseudohyponatremia   Hyperkalemia, transcellular shifts   Volume depletion, unspecified   Discharged Condition: stable  Hospital Course: Patient is a 37 year old male with past medical history significant for poorly controlled type 1 diabetes mellitus.  Patient was admitted with severe diabetic ketoacidosis.  Patient was managed.  Aggressive hydration, insulin drip, glucose monitoring electrolyte monitoring and repletion.  Patient was initially managed by the ICU team.  Patient has been optimized.  Patient be discharged back home to the care of the primary care provider.  Diabetic Ketoacidosis: -Likely due to medication noncompliance   - Aggressive Fluid resuscitation  - IV Insulin w/ EndoTool - CBGs per EndoTool - Serial BMP  - Trend Beta-hydroxybutyric acid -Anion gap closed and insulin drip discontinued. -Patient was transitioned to subcutaneous Levemir and sliding scale insulin. -Electrolytes were monitored, and abnormal electrolytes corrected.   -Patient will be discharged back home to the care of the primary care provider.     Acute Metabolic Encephalopathy: -Secondary to DKA - Supportive Care - UDS was negative. -Influenza B was positive. 10/23/2022: Resolved significantly.   AKI: -Baseline serum creatinine of 0.8.   -Serum creatinine was 2.59 on presentation. -AKI likely due to volume depletion in the setting of DKA -AKI has resolved an serum creatinine is back to baseline.     Chronic Neuropathy Plan - On gabapentin at home, will resume when renal and  mentation status improves.     Consults: None  Significant Diagnostic Studies:  On presentation to the hospital, sodium was 116, potassium was 6.3, chloride of 79, CO2 of less than 7, blood sugar of 924, BUN of 54, serum creatinine of 2.59.  Discharge Exam: Blood pressure 100/67, pulse 98, temperature 99 F (37.2 C), temperature source Oral, resp. rate 13, height 5\' 11"  (1.803 m), weight 58.6 kg, SpO2 100 %.   Disposition: Discharge disposition: 01-Home or Self Care       Discharge Instructions     Diet Carb Modified   Complete by: As directed    Increase activity slowly   Complete by: As directed       Allergies as of 10/25/2022   No Known Allergies      Medication List     STOP taking these medications    gabapentin 100 MG capsule Commonly known as: Neurontin       TAKE these medications    insulin aspart protamine- aspart (70-30) 100 UNIT/ML injection Commonly known as: NOVOLOG MIX 70/30 Inject 0.2 mLs (20 Units total) into the skin 2 (two) times daily.         Time spent: 36 minutes.  SignedBonnell Public 10/25/2022, 1:12 PM

## 2022-10-25 NOTE — Progress Notes (Signed)
Patient had a episode of low blood sugar this AM, patient called and complained about being hot and sweaty, when his Blood sugar was check it was 73, patient ask for some gram crackers and 2 jello. 30 -40 MN later cbg was 151. Report given to the day RN.

## 2022-10-25 NOTE — Progress Notes (Signed)
Patient given discharge instructions and verbalized understanding. Patient PIV x 3 removed, and patient dressed himself. Patient awaiting ride home from family.

## 2022-10-26 LAB — CULTURE, BLOOD (ROUTINE X 2)
Culture: NO GROWTH
Culture: NO GROWTH
Special Requests: ADEQUATE

## 2022-12-22 ENCOUNTER — Ambulatory Visit: Payer: Self-pay | Attending: Family Medicine | Admitting: Physician Assistant

## 2022-12-22 ENCOUNTER — Ambulatory Visit: Payer: Self-pay | Admitting: Family Medicine

## 2023-04-25 IMAGING — DX DG CHEST 1V PORT
1 series · 1 of 1 positions shown · non-contrast
Comparison: None.

CLINICAL DATA: Diabetic ketoacidosis

EXAM:
PORTABLE CHEST 1 VIEW

[chest]
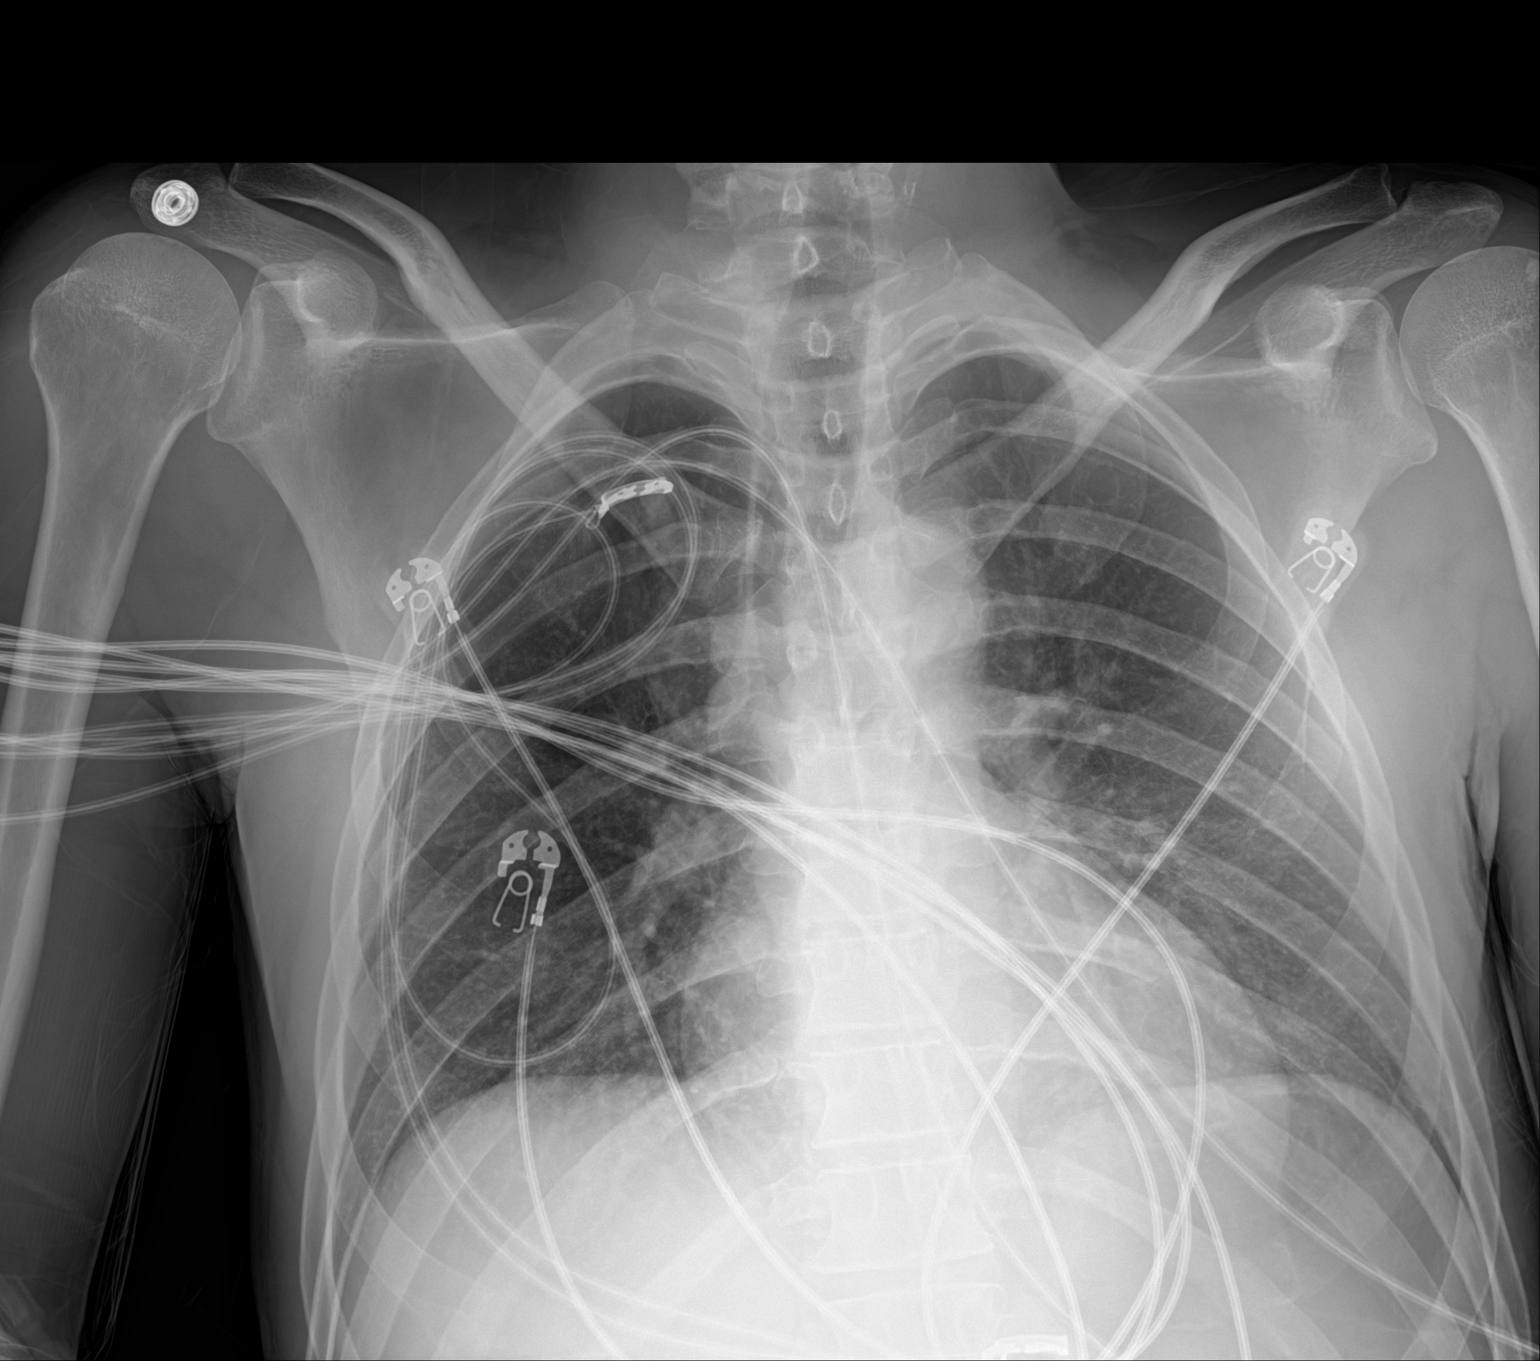

[1 of 1 positions shown; findings below may reference images not displayed]

FINDINGS: The heart size and mediastinal contours are within normal limits. No
focal consolidation. No visible pleural effusion or pneumothorax.
The visualized skeletal structures are unremarkable.
IMPRESSION: No active disease.

## 2024-07-29 ENCOUNTER — Observation Stay (HOSPITAL_COMMUNITY)
Admission: EM | Admit: 2024-07-29 | Discharge: 2024-07-30 | Disposition: A | Discharge status: Home / Self Care | Payer: Self-pay | Attending: Infectious Diseases | Admitting: Infectious Diseases

## 2024-07-29 ENCOUNTER — Encounter (HOSPITAL_COMMUNITY): Payer: Self-pay

## 2024-07-29 ENCOUNTER — Emergency Department (HOSPITAL_COMMUNITY): Payer: Self-pay

## 2024-07-29 ENCOUNTER — Other Ambulatory Visit: Payer: Self-pay

## 2024-07-29 DIAGNOSIS — D72829 Elevated white blood cell count, unspecified: Secondary | ICD-10-CM | POA: Insufficient documentation

## 2024-07-29 DIAGNOSIS — E1110 Type 2 diabetes mellitus with ketoacidosis without coma: Principal | ICD-10-CM | POA: Insufficient documentation

## 2024-07-29 DIAGNOSIS — E1010 Type 1 diabetes mellitus with ketoacidosis without coma: Principal | ICD-10-CM

## 2024-07-29 DIAGNOSIS — N179 Acute kidney failure, unspecified: Secondary | ICD-10-CM | POA: Insufficient documentation

## 2024-07-29 LAB — COMPREHENSIVE METABOLIC PANEL WITH GFR
ALT: 27 U/L (ref 0–44)
AST: 43 U/L — ABNORMAL HIGH (ref 15–41)
Albumin: 4.5 g/dL (ref 3.5–5.0)
Alkaline Phosphatase: 113 U/L (ref 38–126)
Anion gap: 30 — ABNORMAL HIGH (ref 5–15)
BUN: 26 mg/dL — ABNORMAL HIGH (ref 6–20)
CO2: 9 mmol/L — ABNORMAL LOW (ref 22–32)
Calcium: 9.2 mg/dL (ref 8.9–10.3)
Chloride: 94 mmol/L — ABNORMAL LOW (ref 98–111)
Creatinine, Ser: 1.51 mg/dL — ABNORMAL HIGH (ref 0.61–1.24)
GFR, Estimated: 60 mL/min — ABNORMAL LOW (ref >60)
Glucose, Bld: 438 mg/dL — ABNORMAL HIGH (ref 70–99)
Potassium: 4.7 mmol/L (ref 3.5–5.1)
Sodium: 133 mmol/L — ABNORMAL LOW (ref 135–145)
Total Bilirubin: 2.7 mg/dL — ABNORMAL HIGH (ref 0.0–1.2)
Total Protein: 8 g/dL (ref 6.5–8.1)

## 2024-07-29 LAB — I-STAT VENOUS BLOOD GAS, ED
Acid-base deficit: 15 mmol/L — ABNORMAL HIGH (ref 0.0–2.0)
Acid-base deficit: 16 mmol/L — ABNORMAL HIGH (ref 0.0–2.0)
Bicarbonate: 11.5 mmol/L — ABNORMAL LOW (ref 20.0–28.0)
Bicarbonate: 9.4 mmol/L — ABNORMAL LOW (ref 20.0–28.0)
Calcium, Ion: 1.02 mmol/L — ABNORMAL LOW (ref 1.15–1.40)
Calcium, Ion: 1.08 mmol/L — ABNORMAL LOW (ref 1.15–1.40)
HCT: 50 % (ref 39.0–52.0)
HCT: 52 % (ref 39.0–52.0)
Hemoglobin: 17 g/dL (ref 13.0–17.0)
Hemoglobin: 17.7 g/dL — ABNORMAL HIGH (ref 13.0–17.0)
O2 Saturation: 89 %
O2 Saturation: 91 %
Potassium: 4.6 mmol/L (ref 3.5–5.1)
Potassium: 5.5 mmol/L — ABNORMAL HIGH (ref 3.5–5.1)
Sodium: 129 mmol/L — ABNORMAL LOW (ref 135–145)
Sodium: 132 mmol/L — ABNORMAL LOW (ref 135–145)
TCO2: 10 mmol/L — ABNORMAL LOW (ref 22–32)
TCO2: 12 mmol/L — ABNORMAL LOW (ref 22–32)
pCO2, Ven: 23.5 mmHg — ABNORMAL LOW (ref 44–60)
pCO2, Ven: 28.9 mmHg — ABNORMAL LOW (ref 44–60)
pH, Ven: 7.208 — ABNORMAL LOW (ref 7.25–7.43)
pH, Ven: 7.211 — ABNORMAL LOW (ref 7.25–7.43)
pO2, Ven: 68 mmHg — ABNORMAL HIGH (ref 32–45)
pO2, Ven: 72 mmHg — ABNORMAL HIGH (ref 32–45)

## 2024-07-29 LAB — URINALYSIS, ROUTINE W REFLEX MICROSCOPIC
Bacteria, UA: NONE SEEN
Bilirubin Urine: NEGATIVE
Glucose, UA: >=500 mg/dL — AB
Hgb urine dipstick: NEGATIVE
Ketones, ur: 80 mg/dL — AB
Leukocytes,Ua: NEGATIVE
Nitrite: NEGATIVE
Protein, ur: NEGATIVE mg/dL
Specific Gravity, Urine: 1.021 (ref 1.005–1.030)
pH: 5 (ref 5.0–8.0)

## 2024-07-29 LAB — BASIC METABOLIC PANEL WITH GFR
Anion gap: 14 (ref 5–15)
BUN: 22 mg/dL — ABNORMAL HIGH (ref 6–20)
CO2: 19 mmol/L — ABNORMAL LOW (ref 22–32)
Calcium: 8.6 mg/dL — ABNORMAL LOW (ref 8.9–10.3)
Chloride: 101 mmol/L (ref 98–111)
Creatinine, Ser: 1.14 mg/dL (ref 0.61–1.24)
GFR, Estimated: >60 mL/min (ref >60)
Glucose, Bld: 196 mg/dL — ABNORMAL HIGH (ref 70–99)
Potassium: 4.5 mmol/L (ref 3.5–5.1)
Sodium: 134 mmol/L — ABNORMAL LOW (ref 135–145)

## 2024-07-29 LAB — CBG MONITORING, ED
Glucose-Capillary: 376 mg/dL — ABNORMAL HIGH (ref 70–99)
Glucose-Capillary: 497 mg/dL — ABNORMAL HIGH (ref 70–99)
Glucose-Capillary: 485 mg/dL — ABNORMAL HIGH (ref 70–99)
Glucose-Capillary: 362 mg/dL — ABNORMAL HIGH (ref 70–99)
Glucose-Capillary: 330 mg/dL — ABNORMAL HIGH (ref 70–99)
Glucose-Capillary: 264 mg/dL — ABNORMAL HIGH (ref 70–99)
Glucose-Capillary: 202 mg/dL — ABNORMAL HIGH (ref 70–99)
Glucose-Capillary: 193 mg/dL — ABNORMAL HIGH (ref 70–99)
Glucose-Capillary: 216 mg/dL — ABNORMAL HIGH (ref 70–99)
Glucose-Capillary: 207 mg/dL — ABNORMAL HIGH (ref 70–99)

## 2024-07-29 LAB — RESP PANEL BY RT-PCR (RSV, FLU A&B, COVID)  RVPGX2
Influenza A by PCR: NEGATIVE
Influenza B by PCR: NEGATIVE
Resp Syncytial Virus by PCR: NEGATIVE
SARS Coronavirus 2 by RT PCR: NEGATIVE

## 2024-07-29 LAB — I-STAT CHEM 8, ED
BUN: 25 mg/dL — ABNORMAL HIGH (ref 6–20)
BUN: 26 mg/dL — ABNORMAL HIGH (ref 6–20)
BUN: 39 mg/dL — ABNORMAL HIGH (ref 6–20)
Calcium, Ion: 1.08 mmol/L — ABNORMAL LOW (ref 1.15–1.40)
Calcium, Ion: 1.02 mmol/L — ABNORMAL LOW (ref 1.15–1.40)
Calcium, Ion: 1.08 mmol/L — ABNORMAL LOW (ref 1.15–1.40)
Chloride: 100 mmol/L (ref 98–111)
Chloride: 100 mmol/L (ref 98–111)
Chloride: 101 mmol/L (ref 98–111)
Creatinine, Ser: 1 mg/dL (ref 0.61–1.24)
Creatinine, Ser: 0.9 mg/dL (ref 0.61–1.24)
Creatinine, Ser: 1 mg/dL (ref 0.61–1.24)
Glucose, Bld: 414 mg/dL — ABNORMAL HIGH (ref 70–99)
Glucose, Bld: 439 mg/dL — ABNORMAL HIGH (ref 70–99)
Glucose, Bld: 461 mg/dL — ABNORMAL HIGH (ref 70–99)
HCT: 52 % (ref 39.0–52.0)
HCT: 51 % (ref 39.0–52.0)
HCT: 53 % — ABNORMAL HIGH (ref 39.0–52.0)
Hemoglobin: 17.7 g/dL — ABNORMAL HIGH (ref 13.0–17.0)
Hemoglobin: 17.3 g/dL — ABNORMAL HIGH (ref 13.0–17.0)
Hemoglobin: 18 g/dL — ABNORMAL HIGH (ref 13.0–17.0)
Potassium: 4.5 mmol/L (ref 3.5–5.1)
Potassium: 4.7 mmol/L (ref 3.5–5.1)
Potassium: 6.7 mmol/L (ref 3.5–5.1)
Sodium: 133 mmol/L — ABNORMAL LOW (ref 135–145)
Sodium: 130 mmol/L — ABNORMAL LOW (ref 135–145)
Sodium: 132 mmol/L — ABNORMAL LOW (ref 135–145)
TCO2: 14 mmol/L — ABNORMAL LOW (ref 22–32)
TCO2: 12 mmol/L — ABNORMAL LOW (ref 22–32)
TCO2: 13 mmol/L — ABNORMAL LOW (ref 22–32)

## 2024-07-29 LAB — CBC
HCT: 52.5 % — ABNORMAL HIGH (ref 39.0–52.0)
Hemoglobin: 16.9 g/dL (ref 13.0–17.0)
MCH: 28.5 pg (ref 26.0–34.0)
MCHC: 32.2 g/dL (ref 30.0–36.0)
MCV: 88.7 fL (ref 80.0–100.0)
Platelets: 435 K/uL — ABNORMAL HIGH (ref 150–400)
RBC: 5.92 MIL/uL — ABNORMAL HIGH (ref 4.22–5.81)
RDW: 12.2 % (ref 11.5–15.5)
WBC: 15.5 K/uL — ABNORMAL HIGH (ref 4.0–10.5)
nRBC: 0 % (ref 0.0–0.2)

## 2024-07-29 LAB — HIV ANTIBODY (ROUTINE TESTING W REFLEX): HIV Screen 4th Generation wRfx: NONREACTIVE

## 2024-07-29 LAB — LIPASE, BLOOD: Lipase: 16 U/L (ref 11–51)

## 2024-07-29 LAB — BETA-HYDROXYBUTYRIC ACID: Beta-Hydroxybutyric Acid: >8 mmol/L — ABNORMAL HIGH (ref 0.05–0.27)

## 2024-07-29 LAB — HEMOGLOBIN A1C
Hgb A1c MFr Bld: 11.9 % — ABNORMAL HIGH (ref 4.8–5.6)
Mean Plasma Glucose: 294.83 mg/dL

## 2024-07-29 MED ORDER — LACTATED RINGERS IV BOLUS: 1000.0000 mL | Freq: Once | INTRAVENOUS | Status: DC

## 2024-07-29 MED ORDER — POTASSIUM CHLORIDE 10 MEQ/100ML IV SOLN
10.0000 meq | INTRAVENOUS | Status: AC
  Administered 2024-07-29 (×2): 10 meq via INTRAVENOUS
  Filled 2024-07-29 (×2): qty 100

## 2024-07-29 MED ORDER — ENOXAPARIN SODIUM 40 MG/0.4ML IJ SOSY
40.0000 mg | PREFILLED_SYRINGE | INTRAMUSCULAR | Status: DC
  Filled 2024-07-29: qty 0.4

## 2024-07-29 MED ORDER — ONDANSETRON HCL 4 MG/2ML IJ SOLN
4.0000 mg | Freq: Once | INTRAMUSCULAR | Status: AC
  Administered 2024-07-29: 4 mg via INTRAVENOUS
  Filled 2024-07-29: qty 2

## 2024-07-29 MED ORDER — DEXTROSE 50 % IV SOLN: 0.0000 mL | INTRAVENOUS | Status: DC | PRN

## 2024-07-29 MED ORDER — DEXTROSE IN LACTATED RINGERS 5 % IV SOLN: INTRAVENOUS | Status: DC

## 2024-07-29 MED ORDER — LACTATED RINGERS IV SOLN
INTRAVENOUS | Status: DC
Start: 2024-07-29 — End: 2024-07-30

## 2024-07-29 MED ORDER — POTASSIUM CHLORIDE 10 MEQ/100ML IV SOLN
10.0000 meq | INTRAVENOUS | Status: AC
Start: 2024-07-29 — End: 2024-07-30
  Administered 2024-07-29 – 2024-07-30 (×2): 10 meq via INTRAVENOUS
  Filled 2024-07-29 (×2): qty 100

## 2024-07-29 MED ORDER — LACTATED RINGERS IV BOLUS
20.0000 mL/kg | Freq: Once | INTRAVENOUS | Status: AC
  Administered 2024-07-29: 1170 mL via INTRAVENOUS

## 2024-07-29 MED ORDER — INSULIN REGULAR(HUMAN) IN NACL 100-0.9 UT/100ML-% IV SOLN
INTRAVENOUS | Status: DC
  Administered 2024-07-29: 8.5 [IU]/h via INTRAVENOUS
  Filled 2024-07-29: qty 100

## 2024-07-29 MED ORDER — MORPHINE SULFATE (PF) 4 MG/ML IV SOLN: 4.0000 mg | Freq: Once | INTRAVENOUS | Status: DC

## 2024-07-29 NOTE — ED Provider Notes (Signed)
 [place] EMERGENCY DEPARTMENT AT [facility] Provider Note   CSN: [id number] Arrival date & time: 07/29/24  1441     Patient presents with: Abdominal Pain and Emesis   Kevin Russell is a 38 y.o. male.    Abdominal Pain Associated symptoms: vomiting   Emesis Associated symptoms: abdominal pain      Patient has a history of diabetes.  He states he has been taking his medications.  On Friday he started having trouble with nausea or vomiting.  His symptoms have been getting worse since then.  Patient states he is not able to keep anything down.  He is having cramping in his upper abdomen.  He denies any fevers.  No cough.  No sore throat.  Patient states he has been compliant with his medications.  He feels like he is in DKA  Prior to Admission medications   Medication Sig Start Date End Date Taking? Authorizing Provider  insulin  NPH-regular Human (70-30) 100 UNIT/ML injection Inject 13 Units into the skin 2 (two) times daily with a meal.   Yes [provider]    Allergies: Patient has no known allergies.    Review of Systems  Gastrointestinal:  Positive for abdominal pain and vomiting.    Updated Vital Signs BP 114/79   Pulse (!) 123   Temp 97.7 F (36.5 C) (Oral)   Resp 16   Ht 1.803 m (5' 11)   Wt 58.5 kg   SpO2 100%   BMI 17.99 kg/m   Physical Exam Vitals and nursing note reviewed.  Constitutional:      General: He is not in acute distress.    Appearance: He is well-developed. He is ill-appearing.  HENT:     Head: Normocephalic and atraumatic.     Right Ear: External ear normal.     Left Ear: External ear normal.  Eyes:     General: No scleral icterus.       Right eye: No discharge.        Left eye: No discharge.     Conjunctiva/sclera: Conjunctivae normal.  Neck:     Trachea: No tracheal deviation.  Cardiovascular:     Rate and Rhythm: Normal rate and regular rhythm.  Pulmonary:     Effort: Pulmonary effort is normal. No  respiratory distress.     Breath sounds: Normal breath sounds. No stridor. No wheezing or rales.  Abdominal:     General: Bowel sounds are normal. There is no distension.     Palpations: Abdomen is soft.     Tenderness: There is no abdominal tenderness. There is no guarding or rebound.  Musculoskeletal:        General: No tenderness or deformity.     Cervical back: Neck supple.  Skin:    General: Skin is warm and dry.     Findings: No rash.  Neurological:     General: No focal deficit present.     Mental Status: He is alert.     Cranial Nerves: No cranial nerve deficit, dysarthria or facial asymmetry.     Sensory: No sensory deficit.     Motor: No abnormal muscle tone or seizure activity.     Coordination: Coordination normal.  Psychiatric:        Mood and Affect: Mood normal.     (all labs ordered are listed, but only abnormal results are displayed) Labs Reviewed  CBC - Abnormal; Notable for the following components:      Result Value  WBC 15.5 (*)    RBC 5.92 (*)    HCT 52.5 (*)    Platelets 435 (*)    All other components within normal limits  URINALYSIS, ROUTINE W REFLEX MICROSCOPIC - Abnormal; Notable for the following components:   Glucose, UA >=500 (*)    Ketones, ur 80 (*)    All other components within normal limits  BETA-HYDROXYBUTYRIC ACID - Abnormal; Notable for the following components:   Beta-Hydroxybutyric Acid >8.00 (*)    All other components within normal limits  COMPREHENSIVE METABOLIC PANEL WITH GFR - Abnormal; Notable for the following components:   Sodium 133 (*)    Chloride 94 (*)    CO2 9 (*)    Glucose, Bld 438 (*)    BUN 26 (*)    Creatinine, Ser 1.51 (*)    AST 43 (*)    Total Bilirubin 2.7 (*)    GFR, Estimated 60 (*)    Anion gap 30 (*)    All other components within normal limits  CBG MONITORING, ED - Abnormal; Notable for the following components:   Glucose-Capillary 376 (*)    All other components within normal limits  CBG  MONITORING, ED - Abnormal; Notable for the following components:   Glucose-Capillary 497 (*)    All other components within normal limits  I-STAT CHEM 8, ED - Abnormal; Notable for the following components:   Sodium 133 (*)    BUN 25 (*)    Glucose, Bld 414 (*)    Calcium , Ion 1.08 (*)    TCO2 14 (*)    Hemoglobin 17.7 (*)    All other components within normal limits  CBG MONITORING, ED - Abnormal; Notable for the following components:   Glucose-Capillary 485 (*)    All other components within normal limits  I-STAT VENOUS BLOOD GAS, ED - Abnormal; Notable for the following components:   pH, Ven 7.208 (*)    pCO2, Ven 28.9 (*)    pO2, Ven 68 (*)    Bicarbonate 11.5 (*)    TCO2 12 (*)    Acid-base deficit 15.0 (*)    Sodium 129 (*)    Potassium 5.5 (*)    Calcium , Ion 1.02 (*)    Hemoglobin 17.7 (*)    All other components within normal limits  I-STAT CHEM 8, ED - Abnormal; Notable for the following components:   Sodium 130 (*)    Potassium 6.7 (*)    BUN 39 (*)    Glucose, Bld 439 (*)    Calcium , Ion 1.02 (*)    TCO2 13 (*)    Hemoglobin 18.0 (*)    HCT 53.0 (*)    All other components within normal limits  I-STAT CHEM 8, ED - Abnormal; Notable for the following components:   Sodium 132 (*)    BUN 26 (*)    Glucose, Bld 461 (*)    Calcium , Ion 1.08 (*)    TCO2 12 (*)    Hemoglobin 17.3 (*)    All other components within normal limits  I-STAT VENOUS BLOOD GAS, ED - Abnormal; Notable for the following components:   pH, Ven 7.211 (*)    pCO2, Ven 23.5 (*)    pO2, Ven 72 (*)    Bicarbonate 9.4 (*)    TCO2 10 (*)    Acid-base deficit 16.0 (*)    Sodium 132 (*)    Calcium , Ion 1.08 (*)    All other components within normal limits  CBG  MONITORING, ED - Abnormal; Notable for the following components:   Glucose-Capillary 362 (*)    All other components within normal limits  CBG MONITORING, ED - Abnormal; Notable for the following components:   Glucose-Capillary 330 (*)     All other components within normal limits  LIPASE, BLOOD  BASIC METABOLIC PANEL WITH GFR    EKG: None  Radiology: No results found.   .Critical Care  Performed by: Randol Simmonds, MD Authorized by: Randol Simmonds, MD   Critical care provider statement:    Critical care time (minutes):  40   Critical care was time spent personally by me on the following activities:  Development of treatment plan with patient or surrogate, discussions with consultants, evaluation of patient's response to treatment, examination of patient, ordering and review of laboratory studies, ordering and review of radiographic studies, ordering and performing treatments and interventions, pulse oximetry, re-evaluation of patient's condition and review of old charts    Medications Ordered in the ED  insulin  regular, human (MYXREDLIN ) 100 units/ 100 mL infusion (6 Units/hr Intravenous Rate/Dose Change 07/29/24 1854)  lactated ringers  infusion ( Intravenous New Bag/Given 07/29/24 1740)  dextrose  5 % in lactated ringers  infusion (has no administration in time range)  dextrose  50 % solution 0-50 mL (has no administration in time range)  morphine (PF) 4 MG/ML injection 4 mg (0 mg Intravenous Hold 07/29/24 1631)  lactated ringers  bolus 1,170 mL (0 mLs Intravenous Stopped 07/29/24 1835)  potassium chloride  10 mEq in 100 mL IVPB (0 mEq Intravenous Stopped 07/29/24 1747)  ondansetron  (ZOFRAN ) injection 4 mg (4 mg Intravenous Given 07/29/24 1625)  ondansetron  (ZOFRAN ) injection 4 mg (4 mg Intravenous Given 07/29/24 1851)    Clinical Course as of 07/29/24 1912  Sun Jul 29, 2024  1544 I-stat chem 8, ed(!) [JK]  1544 CBC(!) Leukocytosis noted [JK]  1833 Comprehensive metabolic panel(!) Metabolic panel shows an AKI anion gap acidosis consistent with DKA [JK]  1912 Case discussed with IM service regarding admission [JK]    Clinical Course User Index [JK] Randol Simmonds, MD                                 Medical Decision  Making Problems Addressed: Diabetic ketoacidosis without coma associated with type 1 diabetes mellitus (HCC): acute illness or injury that poses a threat to life or bodily functions  Amount and/or Complexity of Data Reviewed Labs: ordered. Decision-making details documented in ED Course. Radiology: ordered and independent interpretation performed.  Risk Prescription drug management. Drug therapy requiring intensive monitoring for toxicity.   Patient presented to the ED for evaluation of nausea vomiting and symptoms concerning for DKA.  Patient's ED evaluation is consistent with DKA.  He has an anion gap acidosis.  Precipitating cause unclear.  Patient states he has been compliant with his medications.  No clear signs of infection at this time.  Will add on a chest x-ray although patient has not been having any cough or shortness of breath.  Patient has been started on IV fluids potassium replacement and an insulin  infusion.  He is feeling somewhat better and is tolerating p.o.  I have consulted with the medical service for admission and further treatment     Final diagnoses:  Diabetic ketoacidosis without coma associated with type 1 diabetes mellitus [facility])    ED Discharge Orders     None          Randol Simmonds,  MD 07/29/24 1913

## 2024-07-29 NOTE — ED Triage Notes (Signed)
 C/o abd. Pain with vomiting onset Wed. States he thought it would be getting better, States he is a diabetic taking his insulin  however isn't able to eat, thinks he is in DKA

## 2024-07-29 NOTE — ED Notes (Signed)
Admit team at bedside. Family at bedside.

## 2024-07-29 NOTE — Hospital Course (Addendum)
 DKA - symptoms of dka with anusea and vomiting over the last couple days, has been taking his medications. Some abdominal pain as well. Has been able to tolerate fluids. Tachy on arrival, no fevers. No definitive signs of infections. AGMA +.     Medications:  Novolin 70/30 15 BID has been working the last two years pretty well - Where does he get this from from [employer]

## 2024-07-29 NOTE — ED Triage Notes (Signed)
 Patient reports this episode started on Friday but unable to eat or drink but still taking meds but doesn't feel he is getting better.

## 2024-07-29 NOTE — H&P (Signed)
 Date: 07/29/2024               Patient Name:  Kevin Russell MRN: [id number]  DOB: 01/30/1986 Age / Sex: 38 y.o., male   PCP: Delbert Clam, MD         Medical Service: Internal Medicine Teaching Service         Attending Physician: Dr. Reyes Fenton      First Contact: Kevin King, DO}    Second Contact: Dr. Roetta Chars, MD          Pager Information: First Contact Pager: [phone]   Second Contact Pager: [phone]   SUBJECTIVE   Chief Complaint: dizziness and throwing up  History of Present Illness: Kevin Russell is a 38 y.o. male with PMH of type 1 diabetes.    Patient here with his child and partner. On Wednesday he started not feeling well. He went to work Thursday and came home early because he threw up. His last meal was Thursday. He wasn't having any abdominal pain, just felt nauseous and light headed when he stood up, like he was drunk. He has had Dka before and this time it didn't feel like Dka, he thought maybe he had just gotten sick. He denies cough or runny nose or fevers. Partner does recall him sweating yesterday but patient says this happens when his sugars are low. Some people at his work have been sick. He has had normal bowel movements, other than today he had diarrhea. He hasn't urinated much in the last few days, he thinks because of poor intake. He did pee today after getting fluids in the ED and at first the color was dark but eventually became his normal color. He denies pain, itching, or burning with urination.   Previous DKA episodes he remembers having blurry vision and abdominal pain, and he wasn't coherent to even know he was in the hospital. This time is different. He has no changes in vision, denies chest pain, abdominal pain, back pain, or leg/feet pain. His only symptoms now are lightheadedness when he stands, anorexia, vomiting/retching, and fatigue. He also feels cold.   The only medicine he takes is Novolin 70/30, 15 units twice daily. He  takes his medicine everyday, the only day he didn't take it was today because it had been several days since he had eaten. He gets this every month from [facility] for 25$. He is reluctant to getting a PCP as well as changing his medication regimen due to financial concerns.   The last time he was in the ED he had bad numbness and tingling in his toes. They told him it was irreversible but he says it got better after a few days and he has not had the pain again. He does not take gabapentin .   ED Course: Labs significant for       CBC      Result Value     WBC 15.5 (*)      RBC 5.92 (*)      HCT 52.5 (*)      Platelets 435 (*)      All other components within normal limits  URINALYSIS, ROUTINE W REFLEX MICROSCOPIC     Glucose, UA >=500 (*)      Ketones, ur 80 (*)      All other components within normal limits  BETA-HYDROXYBUTYRIC ACID - Abnormal; Notable for the following components:    Beta-Hydroxybutyric Acid >8.00 (*)      All other components  within normal limits  COMPREHENSIVE METABOLIC PANEL WITH GFR    Sodium 133 (*)      Chloride 94 (*)      CO2 9 (*)      Glucose, Bld 438 (*)      BUN 26 (*)      Creatinine, Ser 1.51 (*)      AST 43 (*)      Total Bilirubin 2.7 (*)      GFR, Estimated 60 (*)      Anion gap 30 (*)      All other components within normal limits  CBG MONITORING, ED     Glucose-Capillary 376 (*)      All other components within normal limits  CBG MONITORING, ED     Glucose-Capillary 497 (*)      All other components within normal limits  I-STAT CHEM 8, ED     Sodium 133 (*)      BUN 25 (*)      Glucose, Bld 414 (*)      Calcium , Ion 1.08 (*)      TCO2 14 (*)      Hemoglobin 17.7 (*)      All other components within normal limits  CBG MONITORING, ED     Glucose-Capillary 485 (*)      All other components within normal limits  I-STAT VENOUS BLOOD GAS, ED     pH, Ven 7.208 (*)      pCO2, Ven 28.9 (*)      pO2, Ven 68 (*)      Bicarbonate 11.5 (*)       TCO2 12 (*)      Acid-base deficit 15.0 (*)      Sodium 129 (*)      Potassium 5.5 (*)      Calcium , Ion 1.02 (*)      Hemoglobin 17.7 (*)      All other components within normal limits  I-STAT CHEM 8, ED    Sodium 130 (*)      Potassium 6.7 (*)      BUN 39 (*)      Glucose, Bld 439 (*)      Calcium , Ion 1.02 (*)      TCO2 13 (*)      Hemoglobin 18.0 (*)      HCT 53.0 (*)      All other components within normal limits  I-STAT CHEM 8, ED     Sodium 132 (*)      BUN 26 (*)      Glucose, Bld 461 (*)      Calcium , Ion 1.08 (*)      TCO2 12 (*)      Hemoglobin 17.3 (*)      All other components within normal limits  I-STAT VENOUS BLOOD GAS, ED    pH, Ven 7.211 (*)      pCO2, Ven 23.5 (*)      pO2, Ven 72 (*)      Bicarbonate 9.4 (*)      TCO2 10 (*)      Acid-base deficit 16.0 (*)      Sodium 132 (*)      Calcium , Ion 1.08 (*)      All other components within normal limits  CBG MONITORING, ED     Glucose-Capillary 362 (*)      All other components within normal limits  CBG MONITORING, ED    Glucose-Capillary 330 (*)  All other components within normal limits  LIPASE, BLOOD                16   Imaging: CXR with no acute findings  Received: regular insulin , LR, D5, morphine, Kcl, ondansetron   Consulted IMTS  Meds:  15 units Novolin twice daily  Patient reported: yes  Current Meds  Medication Sig   insulin  NPH-regular Human (70-30) 100 UNIT/ML injection Inject 13 Units into the skin 2 (two) times daily with a meal.    Past Medical History T1DM  Past Surgical History History reviewed. No pertinent surgical history.   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 Newlin ([facility])  Family History:  History reviewed. No pertinent family history.   Allergies: Allergies as of 07/29/2024   (No Known  Allergies)    Review of Systems: A complete ROS was negative except as per HPI.   OBJECTIVE:   Physical Exam: Blood pressure (!) 127/92, pulse (!) 122, temperature 98.3 F (36.8 C), temperature source Oral, resp. rate 20, height 5' 11 (1.803 m), weight 58.5 kg, SpO2 100%.  Constitutional: well-appearing male lying in ED bed, in no acute distress HENT: normocephalic atraumatic Eyes: conjunctiva non-erythematous Neck: supple Cardiovascular: tachy, regular rhythm, no m/r/g, + BL DP Pulmonary/Chest: normal work of breathing on room air, lungs clear to auscultation bilaterally Abdominal: soft, non-tender, non-distended MSK: normal bulk Neurological: alert & oriented x 3, tingling of BL toes when pressed Skin: warm and dry Psych: normal mood and behavior   Labs: CBC    Component Value Date/Time   WBC 15.5 (H) 07/29/2024 1508   RBC 5.92 (H) 07/29/2024 1508   HGB 17.0 07/29/2024 1657   HCT 50.0 07/29/2024 1657   PLT 435 (H) 07/29/2024 1508   MCV 88.7 07/29/2024 1508   MCH 28.5 07/29/2024 1508   MCHC 32.2 07/29/2024 1508   RDW 12.2 07/29/2024 1508   LYMPHSABS 2.0 04/30/2022 1837   MONOABS 1.1 (H) 04/30/2022 1837   EOSABS 0.0 04/30/2022 1837   BASOSABS 0.1 04/30/2022 1837     CMP     Component Value Date/Time   NA 134 (L) 07/29/2024 2128   NA 138 09/15/2022 0928   K 4.5 07/29/2024 2128   CL 101 07/29/2024 2128   CO2 19 (L) 07/29/2024 2128   GLUCOSE 196 (H) 07/29/2024 2128   BUN 22 (H) 07/29/2024 2128   BUN 12 09/15/2022 0928   CREATININE 1.14 07/29/2024 2128   CALCIUM  8.6 (L) 07/29/2024 2128   PROT 8.0 07/29/2024 1630   PROT 7.4 09/15/2022 0928   ALBUMIN 4.5 07/29/2024 1630   ALBUMIN 4.8 09/15/2022 0928   AST 43 (H) 07/29/2024 1630   ALT 27 07/29/2024 1630   ALKPHOS 113 07/29/2024 1630   BILITOT 2.7 (H) 07/29/2024 1630   BILITOT 0.7 09/15/2022 0928   GFRNONAA >60 07/29/2024 2128   GFRAA >60 05/13/2019 0316    Imaging:  DG Chest Portable 1 View Result  Date: 07/29/2024 EXAM: 1 VIEW(S) XRAY OF THE CHEST 07/29/2024 07:17:00 PM COMPARISON: None available. CLINICAL HISTORY: dka. dka FINDINGS: LUNGS AND PLEURA: No focal pulmonary opacity. No pulmonary edema. No pleural effusion. No pneumothorax. HEART AND MEDIASTINUM: No acute abnormality of the cardiac and mediastinal silhouettes. BONES AND SOFT TISSUES: No acute osseous abnormality. IMPRESSION: 1. No acute findings. Electronically signed by: Dorethia Molt MD 07/29/2024 07:28 PM EDT  RP Workstation: [id number]     EKG: personally reviewed my interpretation is NSR w PVCs.   ASSESSMENT & PLAN:   Assessment & Plan by Problem: Principal Problem:   DKA (diabetic ketoacidosis) (HCC)   TRENELL CONCANNON is a 38 y.o. person living with a history of T1DM who presented with lightheadedness and vomiting and admitted for DKA on hospital day 0  DKA Patient diagnosed with T1DM in 2019. He's been on 15 units novolin BID for two years. Last office visit in 08/2022, A1c >15, recommended at this visit to do 20 units daily but changed to 15u BID after subsequent admission for DKA in 09/2022. At last admission, he was flu B positive. He does not check his sugars at home and does not eat a carb-modified diet. On admission, glucose 414, K 4.5, anion gap 30, VBG pH 7.2, B-OH >8.  He has a normal exam, aside tachycardia, and is not encephalopathic. Denies all pain. On endotool and D5LR in ED, also given KCL 10 mEq. A1c today 11.9. Recheck BMP after 4 hours, glucose 196, K 4.5, anion gap 14. Lipase normal, EKG with PVCs. I imagine patient has a viral illness which triggered the DKA.  - Will transition to subQ insulin , 10 units, once patient eating  - Kcl 10 meq IV given - Continue IV D5LR  - Continuous cardiac monitoring  - Pending RSV, Flu, Covid - Repeat BMP   Leukocytosis  Denies cough or fevers although he is warm on exam and states he feels cold. He recalls co-workers being sick and he has been too weak and tired to  go to work since thursday. WBC 15.5 on admission. Possible viral etiology. CXR negative for PNA and lungs clear on exam. Afebrile. -monitor temperature -Pending RSV, flu, covid  AKI, resolving  Scr 1 initially and increased to 1.51 within 2 hours. Baseline appears to be around 0.6-0.8. Likely due to dehydration in the setting of anorexia, AGMA, and osmotic diuresis. Recheck BMP Scr 1.14.  - Continue IV D5LR - Repeat BMP  Best practice: Diet: Carb-Modified, NPO until transition to subQ insulin  VTE: Enoxaparin  IVF: LR w D5 Code: Full  Disposition planning: Prior to Admission Living Arrangement: Home, living with partner and child Anticipated Discharge Location: Home  Dispo: Admit patient to Observation with expected length of stay less than 2 midnights.  Signed: Charmayne Holmes, DO Internal Medicine Resident  07/29/2024, 9:57 PM  On Call pager: [phone]

## 2024-07-29 NOTE — ED Notes (Signed)
 Patient got out of his wheelchair and is laying in the floor at triage lobby

## 2024-07-30 DIAGNOSIS — E1010 Type 1 diabetes mellitus with ketoacidosis without coma: Secondary | ICD-10-CM

## 2024-07-30 DIAGNOSIS — N179 Acute kidney failure, unspecified: Secondary | ICD-10-CM

## 2024-07-30 LAB — BASIC METABOLIC PANEL WITH GFR
Anion gap: 10 (ref 5–15)
Anion gap: 9 (ref 5–15)
BUN: 16 mg/dL (ref 6–20)
BUN: 13 mg/dL (ref 6–20)
CO2: 20 mmol/L — ABNORMAL LOW (ref 22–32)
CO2: 24 mmol/L (ref 22–32)
Calcium: 8.3 mg/dL — ABNORMAL LOW (ref 8.9–10.3)
Calcium: 8.3 mg/dL — ABNORMAL LOW (ref 8.9–10.3)
Chloride: 103 mmol/L (ref 98–111)
Chloride: 103 mmol/L (ref 98–111)
Creatinine, Ser: 0.83 mg/dL (ref 0.61–1.24)
Creatinine, Ser: 0.89 mg/dL (ref 0.61–1.24)
GFR, Estimated: >60 mL/min (ref >60)
GFR, Estimated: >60 mL/min (ref >60)
Glucose, Bld: 212 mg/dL — ABNORMAL HIGH (ref 70–99)
Glucose, Bld: 103 mg/dL — ABNORMAL HIGH (ref 70–99)
Potassium: 4.1 mmol/L (ref 3.5–5.1)
Potassium: 3.6 mmol/L (ref 3.5–5.1)
Sodium: 133 mmol/L — ABNORMAL LOW (ref 135–145)
Sodium: 136 mmol/L (ref 135–145)

## 2024-07-30 LAB — CBC
HCT: 39.8 % (ref 39.0–52.0)
HCT: 38.4 % — ABNORMAL LOW (ref 39.0–52.0)
Hemoglobin: 13.8 g/dL (ref 13.0–17.0)
Hemoglobin: 13.2 g/dL (ref 13.0–17.0)
MCH: 28.9 pg (ref 26.0–34.0)
MCH: 28.8 pg (ref 26.0–34.0)
MCHC: 34.7 g/dL (ref 30.0–36.0)
MCHC: 34.4 g/dL (ref 30.0–36.0)
MCV: 83.3 fL (ref 80.0–100.0)
MCV: 83.7 fL (ref 80.0–100.0)
Platelets: 324 K/uL (ref 150–400)
Platelets: 310 K/uL (ref 150–400)
RBC: 4.78 MIL/uL (ref 4.22–5.81)
RBC: 4.59 MIL/uL (ref 4.22–5.81)
RDW: 12.3 % (ref 11.5–15.5)
RDW: 12.4 % (ref 11.5–15.5)
WBC: 19.3 K/uL — ABNORMAL HIGH (ref 4.0–10.5)
WBC: 15.8 K/uL — ABNORMAL HIGH (ref 4.0–10.5)
nRBC: 0 % (ref 0.0–0.2)
nRBC: 0 % (ref 0.0–0.2)

## 2024-07-30 LAB — CBG MONITORING, ED
Glucose-Capillary: 100 mg/dL — ABNORMAL HIGH (ref 70–99)
Glucose-Capillary: 107 mg/dL — ABNORMAL HIGH (ref 70–99)
Glucose-Capillary: 158 mg/dL — ABNORMAL HIGH (ref 70–99)
Glucose-Capillary: 172 mg/dL — ABNORMAL HIGH (ref 70–99)
Glucose-Capillary: 187 mg/dL — ABNORMAL HIGH (ref 70–99)
Glucose-Capillary: 194 mg/dL — ABNORMAL HIGH (ref 70–99)
Glucose-Capillary: 197 mg/dL — ABNORMAL HIGH (ref 70–99)
Glucose-Capillary: 202 mg/dL — ABNORMAL HIGH (ref 70–99)
Glucose-Capillary: 225 mg/dL — ABNORMAL HIGH (ref 70–99)
Glucose-Capillary: 75 mg/dL (ref 70–99)
Glucose-Capillary: 91 mg/dL (ref 70–99)

## 2024-07-30 MED ORDER — INSULIN NPH ISOPHANE & REGULAR (70-30) 100 UNIT/ML ~~LOC~~ SUSP: 17.0000 [IU] | Freq: Two times a day (BID) | SUBCUTANEOUS | 0 refills | Status: DC

## 2024-07-30 MED ORDER — INSULIN ASPART PROT & ASPART (70-30 MIX) 100 UNIT/ML ~~LOC~~ SUSP
13.0000 [IU] | Freq: Two times a day (BID) | SUBCUTANEOUS | Status: DC
  Administered 2024-07-30: 13 [IU] via SUBCUTANEOUS
  Filled 2024-07-30: qty 10

## 2024-07-30 MED ORDER — LACTATED RINGERS IV BOLUS
1000.0000 mL | Freq: Once | INTRAVENOUS | Status: AC
  Administered 2024-07-30: 1000 mL via INTRAVENOUS

## 2024-07-30 NOTE — Progress Notes (Addendum)
 HD#1 SUBJECTIVE:  Patient Summary: Kevin Russell is a 38 y.o. with a pertinent PMH of type 1 diabetes, who presented with nausea, vomiting, and lightheadedness and admitted for DKA.   Overnight Events: None  Interim History: Kevin Russell feels much improved. He notes that he now has an appetite. He has numbness and tingling in his feet. He denies nausea and vomiting this morning. He denies fevers, chills, chest pain, and shortness of breath. He had not had insulin  since Saturday since he did not want to give himself insulin  when he was not eating.   OBJECTIVE:  Vital Signs: Vitals:   07/30/24 0430 07/30/24 0500 07/30/24 0518 07/30/24 0530  BP: 93/66 109/81  109/78  Pulse: (!) 101 (!) 102  91  Resp: 15 (!) 21  13  Temp:   98.1 F (36.7 C)   TempSrc:   Oral   SpO2: 99% 100%  100%  Weight:      Height:       Supplemental O2: Room Air SpO2: 100 %  Filed Weights   07/29/24 1456  Weight: 58.5 kg     Intake/Output Summary (Last 24 hours) at 07/30/2024 0639 Last data filed at 07/30/2024 0010 Gross per 24 hour  Intake 2401.12 ml  Output --  Net 2401.12 ml   Net IO Since Admission: 2,401.12 mL [07/30/24 0639]  Physical Exam: Physical Exam Constitutional:      General: He is not in acute distress.    Appearance: He is not ill-appearing.  HENT:     Head: Normocephalic and atraumatic.  Eyes:     Extraocular Movements: Extraocular movements intact.  Cardiovascular:     Rate and Rhythm: Normal rate and regular rhythm.  Pulmonary:     Effort: Pulmonary effort is normal. No respiratory distress.     Breath sounds: No wheezing or rales.  Abdominal:     General: Abdomen is flat. There is no distension.     Tenderness: There is no abdominal tenderness.  Skin:    General: Skin is warm and dry.  Neurological:     Mental Status: He is alert and oriented to person, place, and time.     Patient Lines/Drains/Airways Status     Active Line/Drains/Airways     Name  Placement date Placement time Site Days   Peripheral IV 07/29/24 20 G Anterior;Distal;Left;Upper Arm 07/29/24  1634  Arm  1   Peripheral IV 07/29/24 20 G Left Antecubital 07/29/24  1820  Antecubital  1            Pertinent labs and imaging:      Latest Ref Rng & Units 07/30/2024    4:55 AM 07/29/2024    4:57 PM 07/29/2024    4:56 PM  CBC  WBC 4.0 - 10.5 K/uL 19.3     Hemoglobin 13.0 - 17.0 g/dL 86.1  82.9  82.6   Hematocrit 39.0 - 52.0 % 39.8  50.0  51.0   Platelets 150 - 400 K/uL 324          Latest Ref Rng & Units 07/30/2024    4:55 AM 07/29/2024    9:28 PM 07/29/2024    4:57 PM  CMP  Glucose 70 - 99 mg/dL 787  803    BUN 6 - 20 mg/dL 16  22    Creatinine 9.38 - 1.24 mg/dL 9.16  8.85    Sodium 864 - 145 mmol/L 133  134  132   Potassium 3.5 - 5.1 mmol/L  4.1  4.5  4.6   Chloride 98 - 111 mmol/L 103  101    CO2 22 - 32 mmol/L 20  19    Calcium  8.9 - 10.3 mg/dL 8.3  8.6      DG Chest Portable 1 View Result Date: 07/29/2024 EXAM: 1 VIEW(S) XRAY OF THE CHEST 07/29/2024 07:17:00 PM COMPARISON: None available. CLINICAL HISTORY: dka. dka FINDINGS: LUNGS AND PLEURA: No focal pulmonary opacity. No pulmonary edema. No pleural effusion. No pneumothorax. HEART AND MEDIASTINUM: No acute abnormality of the cardiac and mediastinal silhouettes. BONES AND SOFT TISSUES: No acute osseous abnormality. IMPRESSION: 1. No acute findings. Electronically signed by: Dorethia Molt MD 07/29/2024 07:28 PM EDT RP Workstation: [id number]    Latest Reference Range & Units 07/29/24 17:42 07/29/24 18:53 07/29/24 19:51 07/29/24 21:04 07/29/24 21:38 07/29/24 22:10 07/29/24 23:12 07/30/24 00:24 07/30/24 01:28 07/30/24 02:58 07/30/24 03:56 07/30/24 05:17 07/30/24 06:46  Glucose-Capillary 70 - 99 mg/dL 637 (H) 669 (H) 735 (H) 202 (H) 216 (H) 193 (H) 207 (H) 225 (H) 202 (H) 197 (H) 194 (H) 187 (H) 172 (H)  (H): Data is abnormally high  ASSESSMENT/PLAN:  Assessment: Principal Problem:   DKA (diabetic  ketoacidosis) (HCC)  Kevin Russell is a 38 y.o. with a pertinent PMH of type 1 diabetes, who presented with nausea, vomiting, and lightheadedness and admitted for DKA.   Plan: #DKA #TIDM Patient presented with nausea, vomiting, malaise and poor PO intake since Thursday. He was originally diagnosed with TIDM in 2019. His home regimen is 15 units 70/30, although it was recommended that he increase to 20 units BID after hospitalization in 2023 with DKA due to influenza B. He does have sick contacts at work, and has to weak and tired to work since Thursday, suggesting possible viral etiology. He had not been using insulin  on 10/5 since he was not eating anything. Initial labs showed glucose 414, K 4.5, anion gap 30, VBG pH 7.2, B-OH >8. Urine showed glucosuria and ketonuria. Initially with tachycardia but not tachypnea. A1C this admission 11.9. He was placed on Endo tool in the ED with closure of his anion gap by the morning of 10/6. He received 40 mEq of potassium over his admission. BMP on the morning of 10/6 showed glucose of 212, anion gap of 10, K of 4.1, and CO2 of 20. Viral panel negative. Lipase normal. As his anion gap is closed, we will transition to subcutaneous insulin . Endotool has maintained a rate of 2.4-3.6U/hr for the past 10 hours or so after glucose improved to 196.  -Transition to regular diet to simulate his home diet and more accurately determine insulin  needs. -13U 70/30 mix with breakfast -Continue endotool for 1-2 hours after subcutaneous insulin  then stop if blood glucose remains well controlled. -Will plan to discharge on 15 units BID as he has not had an DKA episode on this regimen, and consider increasing at his outpatient. -Discontinue LR with dextrose  5% after endotool discontinued -Noon BMP  #Leukocytosis Has had malaise, nausea, and vomiting since Thursday with sick contacts. No documented fevers. Possible due to a viral gastrointestinal illness. WBC 15.5 on admission  and increased to 19.3 this morning. Has been afebrile while here. Urine without signs of infection. Chest x-ray with no acute process. No respiratory symptoms. Flu, Covid, RSV negative -Supportive measures -Trend CBC at noon, plan to discharge in the afternoon if leukocytosis trending down.  #AKI Serum creatinine 1.00 on I-stat and 1.51 on BMP during admission. Likely pre-renal in the setting  of poor PO intake, nausea, vomiting, osmotic diuresis, and volume contraction. Received 2.170 L of LR bolus initially then has been maintained at a rate of 125 mL/hr since 5PM on 10/5. Creatinine improved to 0.83 this morning. -Noon BMP -Encourage oral intake.   Best Practice: Diet: Regular diet IVF: Fluids: LR, Rate: 125 cc/hr x 1 hrs VTE: enoxaparin  (LOVENOX ) injection 40 mg Start: 07/29/24 2030 Code: Full  Disposition planning: DISPO: Anticipated discharge today to Home pending downtrending leukocytosis.  Signature:  Melvenia Napoleon [facility]  6:39 AM, 07/30/2024  On Call pager [phone]

## 2024-07-30 NOTE — ED Notes (Signed)
 Per endo tool pause insulin  infusion, per rounds team prefer to DC drip and LR d5 at 100 post 2 hours SQ insulin  admin pending pt eats breakfast

## 2024-07-30 NOTE — ED Notes (Signed)
 Insulin  drip stopped per endotool instruction.

## 2024-07-30 NOTE — Discharge Summary (Signed)
 Name: Kevin Russell MRN: [id number] DOB: [birth year] 38 y.o. PCP: Delbert Clam, MD  Date of Admission: 07/29/2024  2:49 PM Date of Discharge: 07/30/2024 Attending Physician: Dr. Reyes Fenton  Discharge Diagnosis: 1. Principal Problem:   DKA (diabetic ketoacidosis) (HCC)    Discharge Medications: Allergies as of 07/30/2024   No Known Allergies      Medication List     TAKE these medications    insulin  NPH-regular Human (70-30) 100 UNIT/ML injection Inject 17 Units into the skin 2 (two) times daily with a meal. What changed: how much to take        Disposition and follow-up:   Kevin Russell was discharged from [facility] in Good condition.  At the hospital follow up visit please address:  1.  DKA A1C during hospitalization 11.9. Patient manages diabetes with OTC insulin  70/30 15 units BID with meals from [employer] due to being uninsured. Advised to increase his insulin  to 17 units BID. Please assess for symptoms of hypoglycemia. Explore cost-saving options with the patient. He has not seen a PCP in 2 years due to cost.  2.  Labs / imaging needed at time of follow-up: A1C in 3 months  3.  Pending labs/ test needing follow-up: None  Follow-up Appointments:  Follow-up Information     Delbert Clam, MD. Call in 2 week(s).   Specialty: Family Medicine Why: Call to make an appointment for hospital follow-up. Call to inquire about cost assistance programs Contact information: [address] [place] [address] [place] [address] [phone]                  Hospital Course by problem list: Kevin Russell is a 38 y.o. person living with a history of T1DM who presented with nausea, vomiting, and dizziness and admitted for DKA  now being discharged on hospital day 1 with the following pertinent hospital course:  #DKA #TIDM Patient presented with nausea, vomiting, malaise and poor PO intake for 3 days prior to admission. He was  originally diagnosed with TIDM in 2019. His home regimen was 15 units 70/30, although it was recommended that he increase to 20 units BID after hospitalization in 2023 with DKA due to influenza B. His nausea, vomiting and poor PO intake were thought to be consistent with gastrointestinal illness. He had not been using insulin  on 10/5 since he was not eating anything. Initial labs showed glucose 414, K 4.5, anion gap 30, VBG pH 7.2, B-OH >8. Urine showed glucosuria and ketonuria. Initially with tachycardia but not tachypnea. A1C this admission 11.9. He was placed on Endo tool with LR in the ED with closure of his anion gap by the morning of 10/6. He received 40 mEq of potassium over his admission. Viral panel negative. Lipase normal. As his anion gap is closed, he was transitioned to a Novolin mix 70/30. He was able to tolerate diet on the day of discharge. Glucose at time of discharge was 103, anion gap improved to 9, CO2 at 24. He was advised to increase his insulin  to 17 units BID given his elevated A1C.   #Leukocytosis Has had malaise, nausea, and vomiting since Thursday with sick contacts. No documented fevers. Possible due to a viral gastrointestinal illness. WBC 15.5 on admission and 15.8 on the day of discharge. He was afebrile while here. Urine without signs of infection. Chest x-ray with no acute process. No respiratory symptoms. Flu, Covid, RSV negative. He was advised to continue with oral  hydration.   #AKI Serum creatinine 1.00 on I-stat and 1.51 on BMP during admission. Likely pre-renal in the setting of poor PO intake, nausea, vomiting, osmotic diuresis, and volume contraction. Received 2.170 L of LR bolus initially then has been maintained at a rate of 125 mL/hr. Creatinine improved to 0.89 at discharge. Received another 1L LR bolus on 10/6 due to mild tachycardia and orthostatic symptoms.   Discharge Subjective Kevin Russell feels much improved. He notes that he now has an appetite. He has  numbness and tingling in his feet. He denies nausea and vomiting this morning. He denies fevers, chills, chest pain, and shortness of breath. He had not had insulin  since Saturday since he did not want to give himself insulin  when he was not eating.   Discharge Exam:   BP 114/83   Pulse 88   Temp 99 F (37.2 C)   Resp 14   Ht 5' 11 (1.803 m)   Wt 58.5 kg   SpO2 100%   BMI 17.99 kg/m  Discharge exam:  Constitutional:      General: He is not in acute distress.    Appearance: He is not ill-appearing.  HENT:     Head: Normocephalic and atraumatic.  Eyes:     Extraocular Movements: Extraocular movements intact.  Cardiovascular:     Rate and Rhythm: Normal rate and regular rhythm.  Pulmonary:     Effort: Pulmonary effort is normal. No respiratory distress.     Breath sounds: No wheezing or rales.  Abdominal:     General: Abdomen is flat. There is no distension.     Tenderness: There is no abdominal tenderness.  Skin:    General: Skin is warm and dry.  Neurological:     Mental Status: He is alert and oriented to person, place, and time.   Pertinent Labs, Studies, and Procedures:     Latest Ref Rng & Units 07/30/2024   12:00 PM 07/30/2024    4:55 AM 07/29/2024    4:57 PM  CBC  WBC 4.0 - 10.5 K/uL 15.8  19.3    Hemoglobin 13.0 - 17.0 g/dL 86.7  86.1  82.9   Hematocrit 39.0 - 52.0 % 38.4  39.8  50.0   Platelets 150 - 400 K/uL 310  324         Latest Ref Rng & Units 07/30/2024   12:00 PM 07/30/2024    4:55 AM 07/29/2024    9:28 PM  CMP  Glucose 70 - 99 mg/dL 896  787  803   BUN 6 - 20 mg/dL 13  16  22    Creatinine 0.61 - 1.24 mg/dL 9.10  9.16  8.85   Sodium 135 - 145 mmol/L 136  133  134   Potassium 3.5 - 5.1 mmol/L 3.6  4.1  4.5   Chloride 98 - 111 mmol/L 103  103  101   CO2 22 - 32 mmol/L 24  20  19    Calcium  8.9 - 10.3 mg/dL 8.3  8.3  8.6     DG Chest Portable 1 View Result Date: 07/29/2024 EXAM: 1 VIEW(S) XRAY OF THE CHEST 07/29/2024 07:17:00 PM COMPARISON: None  available. CLINICAL HISTORY: dka. dka FINDINGS: LUNGS AND PLEURA: No focal pulmonary opacity. No pulmonary edema. No pleural effusion. No pneumothorax. HEART AND MEDIASTINUM: No acute abnormality of the cardiac and mediastinal silhouettes. BONES AND SOFT TISSUES: No acute osseous abnormality. IMPRESSION: 1. No acute findings. Electronically signed by: Dorethia Molt MD 07/29/2024 07:28 PM  EDT RP Workstation: [id number]     Discharge Instructions:  You were hospitalized for diabetic ketoacidosis. Thank you for allowing us  to be part of your care.   Please call to schedule an appointment with your primary care doctor in the next week.  Please note these changes made to your medications:   *Please Increase your dose of insulin  70/30 to 17 units twice a day. Monitor yourself for low blood sugars with your glucose test strips.    Signed: Napoleon Limes, MD 07/30/2024, 2:32 PM

## 2024-07-30 NOTE — ED Notes (Signed)
 Per MD endo tool okay to cancel at this time. Md had conversation with pt about eating sandwich pt seems agreeable

## 2024-07-30 NOTE — ED Notes (Signed)
 Hourly rounding complete. Pt alert or resting, no distress noted, offered toileting and diet as appropriate. Side rails up, call light within reach. Pt denies pain or further needs at this time.

## 2024-07-30 NOTE — ED Notes (Signed)
MD rounding team at bedside  

## 2024-07-30 NOTE — ED Notes (Signed)
 Pt refuses to eat food or drink, pt aware BG 75 but refuses to eat crackers, peanut butter, ice cream, juice, soda sandwich or apple sauce. d5Lr continues to run at this time MD made aware of pt refusal to eat and pt aware of delay in DC due to refusal to eat

## 2024-07-30 NOTE — ED Notes (Signed)
Pt verbalizes understanding of DC instructions. Pt belongings returned and is ambulatory out of ED.  

## 2024-07-30 NOTE — Inpatient Diabetes Management (Signed)
 Inpatient Diabetes Program Recommendations  AACE/ADA: New Consensus Statement on Inpatient Glycemic Control (2015)  Target Ranges:  Prepandial:   less than 140 mg/dL      Peak postprandial:   less than 180 mg/dL (1-2 hours)      Critically ill patients:  140 - 180 mg/dL   Lab Results  Component Value Date   GLUCAP 100 (H) 07/30/2024   HGBA1C 11.9 (H) 07/29/2024    Review of Glycemic Control  Diabetes history: DM1 (Makes no insulin , requires basal,meal coverage and correction)  Outpatient Diabetes medications: 70/30 15 units BID Current orders for Inpatient glycemic control: 70/30 13 units BID  Inpatient Diabetes Program Recommendations:    Spoke with pt at bedside in ED. Pt has been discharged. Briefly asked pt if he has any problems with getting his meds and he said no. States HgbA1C has come down from over 15%. Will be discharged on 70/30 17 units BID. Encouraged pt to check his blood sugars at least twice/day. PCP is [facility]. Denies any hypoglycemia. Not interested in talking about his diabetes at this time.   Thank you. Shona Brandy, RD, LDN, CDCES Inpatient Diabetes Coordinator [phone]

## 2024-07-30 NOTE — ED Notes (Signed)
Pt ate half of lunch tray.  °

## 2024-07-30 NOTE — Discharge Instructions (Signed)
  You were hospitalized for diabetic ketoacidosis. Thank you for allowing us  to be part of your care.   Please call to schedule an appointment with your primary care doctor in the next week.  Please note these changes made to your medications:   *Please Increase your dose of insulin  70/30 to 17 units twice a day. Monitor yourself for low blood sugars with your glucose test strips.

## 2024-08-04 ENCOUNTER — Encounter (HOSPITAL_BASED_OUTPATIENT_CLINIC_OR_DEPARTMENT_OTHER): Payer: Self-pay | Admitting: Emergency Medicine

## 2024-08-04 ENCOUNTER — Emergency Department (HOSPITAL_BASED_OUTPATIENT_CLINIC_OR_DEPARTMENT_OTHER)
Admission: EM | Admit: 2024-08-04 | Discharge: 2024-08-04 | Disposition: A | Discharge status: Home / Self Care | Payer: Self-pay | Attending: Emergency Medicine | Admitting: Emergency Medicine

## 2024-08-04 ENCOUNTER — Other Ambulatory Visit: Payer: Self-pay

## 2024-08-04 DIAGNOSIS — R42 Dizziness and giddiness: Secondary | ICD-10-CM | POA: Insufficient documentation

## 2024-08-04 DIAGNOSIS — R112 Nausea with vomiting, unspecified: Secondary | ICD-10-CM | POA: Insufficient documentation

## 2024-08-04 LAB — CBC WITH DIFFERENTIAL/PLATELET
Abs Immature Granulocytes: 0.02 K/uL (ref 0.00–0.07)
Basophils Absolute: 0 K/uL (ref 0.0–0.1)
Basophils Relative: 1 %
Eosinophils Absolute: 0.1 K/uL (ref 0.0–0.5)
Eosinophils Relative: 1 %
HCT: 43 % (ref 39.0–52.0)
Hemoglobin: 14.7 g/dL (ref 13.0–17.0)
Immature Granulocytes: 0 %
Lymphocytes Relative: 26 %
Lymphs Abs: 1.8 K/uL (ref 0.7–4.0)
MCH: 28.8 pg (ref 26.0–34.0)
MCHC: 34.2 g/dL (ref 30.0–36.0)
MCV: 84.1 fL (ref 80.0–100.0)
Monocytes Absolute: 0.6 K/uL (ref 0.1–1.0)
Monocytes Relative: 9 %
Neutro Abs: 4.3 K/uL (ref 1.7–7.7)
Neutrophils Relative %: 63 %
Platelets: 295 K/uL (ref 150–400)
RBC: 5.11 MIL/uL (ref 4.22–5.81)
RDW: 12.4 % (ref 11.5–15.5)
WBC: 6.8 K/uL (ref 4.0–10.5)
nRBC: 0 % (ref 0.0–0.2)

## 2024-08-04 LAB — I-STAT VENOUS BLOOD GAS, ED
Acid-Base Excess: 5 mmol/L — ABNORMAL HIGH (ref 0.0–2.0)
Bicarbonate: 32 mmol/L — ABNORMAL HIGH (ref 20.0–28.0)
Calcium, Ion: 1.2 mmol/L (ref 1.15–1.40)
HCT: 44 % (ref 39.0–52.0)
Hemoglobin: 15 g/dL (ref 13.0–17.0)
O2 Saturation: 26 %
Patient temperature: 98
Potassium: 4.2 mmol/L (ref 3.5–5.1)
Sodium: 135 mmol/L (ref 135–145)
TCO2: 34 mmol/L — ABNORMAL HIGH (ref 22–32)
pCO2, Ven: 53.6 mmHg (ref 44–60)
pH, Ven: 7.382 (ref 7.25–7.43)
pO2, Ven: 18 mmHg — CL (ref 32–45)

## 2024-08-04 LAB — URINALYSIS, ROUTINE W REFLEX MICROSCOPIC
Bacteria, UA: NONE SEEN
Bilirubin Urine: NEGATIVE
Glucose, UA: >1000 mg/dL — AB
Hgb urine dipstick: NEGATIVE
Ketones, ur: 15 mg/dL — AB
Leukocytes,Ua: NEGATIVE
Nitrite: NEGATIVE
Protein, ur: NEGATIVE mg/dL
Specific Gravity, Urine: 1.041 — ABNORMAL HIGH (ref 1.005–1.030)
pH: 6.5 (ref 5.0–8.0)

## 2024-08-04 LAB — BETA-HYDROXYBUTYRIC ACID
Beta-Hydroxybutyric Acid: 0.52 mmol/L — ABNORMAL HIGH (ref 0.05–0.27)
Beta-Hydroxybutyric Acid: 0.05 mmol/L (ref 0.05–0.27)

## 2024-08-04 LAB — BASIC METABOLIC PANEL WITH GFR
Anion gap: 10 (ref 5–15)
Anion gap: 7 (ref 5–15)
BUN: 12 mg/dL (ref 6–20)
BUN: 11 mg/dL (ref 6–20)
CO2: 28 mmol/L (ref 22–32)
CO2: 31 mmol/L (ref 22–32)
Calcium: 9.3 mg/dL (ref 8.9–10.3)
Calcium: 9.4 mg/dL (ref 8.9–10.3)
Chloride: 97 mmol/L — ABNORMAL LOW (ref 98–111)
Chloride: 101 mmol/L (ref 98–111)
Creatinine, Ser: 0.8 mg/dL (ref 0.61–1.24)
Creatinine, Ser: 0.64 mg/dL (ref 0.61–1.24)
GFR, Estimated: >60 mL/min (ref >60)
GFR, Estimated: >60 mL/min (ref >60)
Glucose, Bld: 431 mg/dL — ABNORMAL HIGH (ref 70–99)
Glucose, Bld: 152 mg/dL — ABNORMAL HIGH (ref 70–99)
Potassium: 4.3 mmol/L (ref 3.5–5.1)
Potassium: 4.3 mmol/L (ref 3.5–5.1)
Sodium: 135 mmol/L (ref 135–145)
Sodium: 139 mmol/L (ref 135–145)

## 2024-08-04 LAB — CBG MONITORING, ED
Glucose-Capillary: 377 mg/dL — ABNORMAL HIGH (ref 70–99)
Glucose-Capillary: 367 mg/dL — ABNORMAL HIGH (ref 70–99)
Glucose-Capillary: 253 mg/dL — ABNORMAL HIGH (ref 70–99)
Glucose-Capillary: 195 mg/dL — ABNORMAL HIGH (ref 70–99)

## 2024-08-04 MED ORDER — MECLIZINE HCL 25 MG PO TABS: 25.0000 mg | ORAL_TABLET | Freq: Three times a day (TID) | ORAL | 0 refills | Status: DC | PRN

## 2024-08-04 MED ORDER — DEXTROSE IN LACTATED RINGERS 5 % IV SOLN: INTRAVENOUS | Status: DC

## 2024-08-04 MED ORDER — ONDANSETRON HCL 4 MG PO TABS: 4.0000 mg | ORAL_TABLET | Freq: Four times a day (QID) | ORAL | 0 refills | Status: DC

## 2024-08-04 MED ORDER — DEXTROSE 50 % IV SOLN: 0.0000 mL | INTRAVENOUS | Status: DC | PRN

## 2024-08-04 MED ORDER — MECLIZINE HCL 25 MG PO TABS
25.0000 mg | ORAL_TABLET | Freq: Once | ORAL | Status: AC
  Administered 2024-08-04: 25 mg via ORAL
  Filled 2024-08-04: qty 1

## 2024-08-04 MED ORDER — POTASSIUM CHLORIDE 10 MEQ/100ML IV SOLN
10.0000 meq | INTRAVENOUS | Status: AC
  Administered 2024-08-04 (×2): 10 meq via INTRAVENOUS
  Filled 2024-08-04: qty 100

## 2024-08-04 MED ORDER — LACTATED RINGERS IV BOLUS
20.0000 mL/kg | Freq: Once | INTRAVENOUS | Status: AC
  Administered 2024-08-04: 1170 mL via INTRAVENOUS

## 2024-08-04 MED ORDER — LACTATED RINGERS IV SOLN: INTRAVENOUS | Status: DC

## 2024-08-04 MED ORDER — INSULIN REGULAR(HUMAN) IN NACL 100-0.9 UT/100ML-% IV SOLN
INTRAVENOUS | Status: DC
  Administered 2024-08-04: 10 [IU]/h via INTRAVENOUS
  Filled 2024-08-04: qty 100

## 2024-08-04 NOTE — ED Triage Notes (Signed)
 Reports dizziness and nausea x 1 week. Denies ABD pain.   Recently d/c on Monday for DKA. BG 377.

## 2024-08-04 NOTE — ED Provider Notes (Signed)
 [facility] EMERGENCY DEPARTMENT AT [facility] Provider Note   CSN: [id number] Arrival date & time: 08/04/24  1533     Patient presents with: Dizziness     Dizziness Associated symptoms: nausea and vomiting    Patient is a 38 year old male presenting today for concerns for vertigo that has been ongoing x 7 days.  Noted to have felt similarly when he was last seen for DKA on 10/5.  Reports that he has had persistent symptoms which have been accompanied with nausea and vomiting.  Reports that he feels normal otherwise.  Reports taking his insulin  15 units at night and in the morning regularly.  Reports that vertigo-like symptoms are worse with head position.  Particularly when looking left and right as well as going from the sitting to standing position.  Denies headache, fever, blurry vision, tinnitus, hearing loss, dysphagia, odynophagia, cough, congestion, chest pain, shortness of breath, abdominal pain, hematemesis, diarrhea, melena, hematochezia, dysuria, lower leg swelling.    Prior to Admission medications   Medication Sig Start Date End Date Taking? Authorizing Provider  meclizine (ANTIVERT) 25 MG tablet Take 1 tablet (25 mg total) by mouth 3 (three) times daily as needed for dizziness. 08/04/24  Yes Haven Foss S, PA-C  ondansetron  (ZOFRAN ) 4 MG tablet Take 1 tablet (4 mg total) by mouth every 6 (six) hours. 08/04/24  Yes Beola Terrall RAMAN, PA-C  insulin  NPH-regular Human (70-30) 100 UNIT/ML injection Inject 17 Units into the skin 2 (two) times daily with a meal. 07/30/24   Smucker, Melvenia, MD    Allergies: Patient has no known allergies.    Review of Systems  Gastrointestinal:  Positive for nausea and vomiting.  Neurological:  Positive for dizziness.  All other systems reviewed and are negative.   Updated Vital Signs BP (!) 122/94   Pulse 84   Temp 98.6 F (37 C) (Oral)   Resp 19   SpO2 100%   Physical Exam Vitals and nursing note reviewed.   Constitutional:      General: He is not in acute distress.    Appearance: Normal appearance. He is not ill-appearing or diaphoretic.  HENT:     Head: Normocephalic and atraumatic.     Right Ear: Tympanic membrane, ear canal and external ear normal.     Left Ear: Tympanic membrane, ear canal and external ear normal.     Nose: Nose normal. No congestion.     Mouth/Throat:     Mouth: Mucous membranes are moist.     Pharynx: Oropharynx is clear. No oropharyngeal exudate or posterior oropharyngeal erythema.  Eyes:     General: No scleral icterus.       Right eye: No discharge.        Left eye: No discharge.     Extraocular Movements: Extraocular movements intact.     Conjunctiva/sclera: Conjunctivae normal.     Pupils: Pupils are equal, round, and reactive to light.  Cardiovascular:     Rate and Rhythm: Normal rate and regular rhythm.     Pulses: Normal pulses.     Heart sounds: Normal heart sounds. No murmur heard.    No friction rub. No gallop.  Pulmonary:     Effort: Pulmonary effort is normal. No respiratory distress.     Breath sounds: No stridor. No wheezing, rhonchi or rales.  Chest:     Chest wall: No tenderness.  Abdominal:     General: Abdomen is flat. There is no distension.     Palpations: Abdomen  is soft.     Tenderness: There is no abdominal tenderness. There is no right CVA tenderness, left CVA tenderness, guarding or rebound.  Musculoskeletal:        General: No swelling, deformity or signs of injury.     Cervical back: Normal range of motion. No rigidity.     Right lower leg: No edema.     Left lower leg: No edema.  Skin:    General: Skin is warm and dry.     Findings: No bruising, erythema or lesion.  Neurological:     General: No focal deficit present.     Mental Status: He is alert and oriented to person, place, and time. Mental status is at baseline.     Cranial Nerves: No cranial nerve deficit.     Sensory: No sensory deficit.     Motor: No weakness.      Coordination: Coordination normal.     Gait: Gait normal.     Comments: No facial asymmetry, no ataxia, no apraxia, no aphasia, no arm drift, normal coordination with finger-to-nose, normal sensation to both upper and lower extremities bilaterally, normal grip strength bilaterally, normal strength to both flexion and extension to both upper lower extremities 5+ bilaterally, no visual field deficits, no nystagmus.   Psychiatric:        Mood and Affect: Mood normal.     (all labs ordered are listed, but only abnormal results are displayed) Labs Reviewed  BASIC METABOLIC PANEL WITH GFR - Abnormal; Notable for the following components:      Result Value   Chloride 97 (*)    Glucose, Bld 431 (*)    All other components within normal limits  BASIC METABOLIC PANEL WITH GFR - Abnormal; Notable for the following components:   Glucose, Bld 152 (*)    All other components within normal limits  BETA-HYDROXYBUTYRIC ACID - Abnormal; Notable for the following components:   Beta-Hydroxybutyric Acid 0.52 (*)    All other components within normal limits  URINALYSIS, ROUTINE W REFLEX MICROSCOPIC - Abnormal; Notable for the following components:   Specific Gravity, Urine 1.041 (*)    Glucose, UA >1,000 (*)    Ketones, ur 15 (*)    All other components within normal limits  CBG MONITORING, ED - Abnormal; Notable for the following components:   Glucose-Capillary 377 (*)    All other components within normal limits  I-STAT VENOUS BLOOD GAS, ED - Abnormal; Notable for the following components:   pO2, Ven 18 (*)    Bicarbonate 32.0 (*)    TCO2 34 (*)    Acid-Base Excess 5.0 (*)    All other components within normal limits  CBG MONITORING, ED - Abnormal; Notable for the following components:   Glucose-Capillary 367 (*)    All other components within normal limits  CBG MONITORING, ED - Abnormal; Notable for the following components:   Glucose-Capillary 253 (*)    All other components within normal  limits  CBG MONITORING, ED - Abnormal; Notable for the following components:   Glucose-Capillary 195 (*)    All other components within normal limits  CBC WITH DIFFERENTIAL/PLATELET  BASIC METABOLIC PANEL WITH GFR  BASIC METABOLIC PANEL WITH GFR  BETA-HYDROXYBUTYRIC ACID  BETA-HYDROXYBUTYRIC ACID  BETA-HYDROXYBUTYRIC ACID  BASIC METABOLIC PANEL WITH GFR  BETA-HYDROXYBUTYRIC ACID    EKG: None  Radiology: No results found.  Procedures   Medications Ordered in the ED  lactated ringers  infusion (0 mLs Intravenous Stopped 08/04/24 1943)  lactated  ringers  bolus 1,170 mL (0 mLs Intravenous Stopped 08/04/24 1943)  potassium chloride  10 mEq in 100 mL IVPB (0 mEq Intravenous Stopped 08/04/24 1914)  meclizine (ANTIVERT) tablet 25 mg (25 mg Oral Given 08/04/24 2011)                                 Medical Decision Making Amount and/or Complexity of Data Reviewed Labs: ordered.  Risk Prescription drug management.   This patient is a 38 year old male who presents to the ED for concern of persistent vertigo like symptoms worse with positioning and accompanied with nausea and vomiting this and present for the last 7 days, noted to have had this when he was previously evaluated in the ER on 10/5 for DKA.  Does that he has been controlling his sugars with 15 units of insulin  in the morning and evening.  On physical exam, patient is in no acute distress, afebrile, alert and orient x 4, speaking in full sentences, nontachypneic, nontachycardic.  Normal neuroexam, normal ENT exam.  LCTAB, RRR, no murmur.  Negative Dix-Hallpike however upon sitting back up from the laying position noted to have recurrence of dizziness.  Unremarkable otherwise.  Sugars were very elevated and ketones were noted in the urine.  However not acidotic, not currently in DKA.  Provided fluids as well as meclizine.  Patient symptoms had improved on reevaluation.  However patient still state that he felt woozy,  denying vertigo, blurry vision, nausea.  As to when questioning patient as to what he mean by this he continually said he felt like he was getting off of a carnival ride but persistently denied that he felt like the room was spinning or felt unsteady or experiencing any nausea.  Patient was ambulated without difficulty and without any balance issues.   Does not have any cardiac symptoms.  Low suspicion for ACS, PE, AAS, pneumonia.  Patient was upset that we have not found the cause of his wooziness.  He did not really see had a scheduled follow-up with PCP in a week.  Provided information for him again to follow-up with the PCP that is already scheduled for him after his last discharge.  Will send home with Zofran  as well as meclizine.  Case discussed with attending who agrees with plan and did not believe MRI is warranted at this time.  Patient vital signs have remained stable throughout the course of patient's time in the ED. Low suspicion for any other emergent pathology at this time. I believe this patient is safe to be discharged. Provided strict return to ER precautions. Patient expressed agreement and understanding of plan. All questions were answered.  Differential diagnoses prior to evaluation: The emergent differential diagnosis includes, but is not limited to,  BPPV, vestibular migraine, head trauma, AVM, intracranial tumor, multiple sclerosis, drug-related, CVA, orthostatic hypotension, sepsis, hypoglycemia, electrolyte disturbance, anemia, anxiety  . This is not an exhaustive differential.   Past Medical History / Co-morbidities / Social History: Type 1 diabetes, major depressive disorder, cannabis abuse, DKA  Additional history: Chart reviewed. Pertinent results include:   Last seen 07/29/2024 for DKA noted to have nausea, vomiting and DKA presentation.  Had anion gap acidosis.  With unclear cause. Discharge from hospital on 07/30/2024 noting that have not seen PCP in 2 years  secondary to cost.  Scheduled for follow-up with PCP  Lab Tests/Imaging studies: I personally interpreted labs/imaging and the pertinent results include:   CBC  unremarkable BMP notes elevated glucose of 431 Venous blood gas shows normal pH.  Beta-hydroxybutyrate acid elevated at 0.52 I-STAT venous blood gas shows normal pH Repeat BMP shows glucose of 150.   Cardiac monitoring: EKG obtained and interpreted by myself and attending physician which shows: Sinus tachycardia     Medications: I ordered medication including meclizine, LR, potassium, dextrose , insulin .  I have reviewed the patients home medicines and have made adjustments as needed.  Critical Interventions: None  Social Determinants of Health: Has good follow-up with PCP in 1 week  Disposition: After consideration of the diagnostic results and the patients response to treatment, I feel that the patient would benefit from discharge and treatment note as above.   emergency department workup does not suggest an emergent condition requiring admission or immediate intervention beyond what has been performed at this time. The plan is: Follow-up PCP, continue monitoring and managing sugars, return to ED for new or worsening symptoms, symptomatic management at home. The patient is safe for discharge and has been instructed to return immediately for worsening symptoms, change in symptoms or any other concerns.   Final diagnoses:  Dizziness    ED Discharge Orders          Ordered    meclizine (ANTIVERT) 25 MG tablet  3 times daily PRN        08/04/24 2102    ondansetron  (ZOFRAN ) 4 MG tablet  Every 6 hours        08/04/24 2102           Portions of this report may have been transcribed using voice recognition software. Every effort was made to ensure accuracy; however, inadvertent computerized transcription errors may be present.     Beola Terrall RAMAN, PA-C 08/04/24 2109    Pamella Ozell LABOR, DO 08/08/24 [phone]

## 2024-08-04 NOTE — Discharge Instructions (Addendum)
 You are seen today for wooziness.  Your sugars today were very elevated this is likely contributory to your symptoms today.  Your neuroexam and lab work otherwise is very reassuring that I low suspicion for any other emergent causes today.  You will need to continue to monitor your blood sugars as this will worsen your current issues.  You will need to follow-up with your PCP which I am attaching his information here.  I have also sent in some nausea medication as well as some meclizine to help out with the woozy sensation you been experiencing.  Return to the ED though if you need to have any new or worsening symptoms which would include worsening vertigo, persistent vomiting that is uncontrolled with nausea medications, one-sided weakness.

## 2024-08-04 NOTE — ED Notes (Signed)
 Pt ambulated w/o assistance in hallway. He advised while he does not feel dizzy he feels more like he is not quite sober, but has not had a drink. He describes the feeling of first waking up and feeling a little off.

## 2024-10-03 ENCOUNTER — Inpatient Hospital Stay (HOSPITAL_COMMUNITY)
Admission: EM | Admit: 2024-10-03 | Discharge: 2024-10-05 | DRG: 639 | Disposition: A | Discharge status: Home / Self Care | Payer: Self-pay | Attending: Internal Medicine | Admitting: Internal Medicine

## 2024-10-03 ENCOUNTER — Other Ambulatory Visit: Payer: Self-pay

## 2024-10-03 ENCOUNTER — Encounter (HOSPITAL_COMMUNITY): Payer: Self-pay

## 2024-10-03 DIAGNOSIS — E1010 Type 1 diabetes mellitus with ketoacidosis without coma: Principal | ICD-10-CM | POA: Diagnosis present

## 2024-10-03 DIAGNOSIS — E1110 Type 2 diabetes mellitus with ketoacidosis without coma: Secondary | ICD-10-CM | POA: Diagnosis present

## 2024-10-03 DIAGNOSIS — D72829 Elevated white blood cell count, unspecified: Secondary | ICD-10-CM | POA: Diagnosis present

## 2024-10-03 DIAGNOSIS — D751 Secondary polycythemia: Secondary | ICD-10-CM | POA: Diagnosis present

## 2024-10-03 LAB — I-STAT VENOUS BLOOD GAS, ED
Acid-base deficit: 2 mmol/L (ref 0.0–2.0)
Bicarbonate: 22.8 mmol/L (ref 20.0–28.0)
Calcium, Ion: 1.04 mmol/L — ABNORMAL LOW (ref 1.15–1.40)
HCT: 51 % (ref 39.0–52.0)
Hemoglobin: 17.3 g/dL — ABNORMAL HIGH (ref 13.0–17.0)
O2 Saturation: 72 %
Potassium: 4.4 mmol/L (ref 3.5–5.1)
Sodium: 127 mmol/L — ABNORMAL LOW (ref 135–145)
TCO2: 24 mmol/L (ref 22–32)
pCO2, Ven: 37.4 mmHg — ABNORMAL LOW (ref 44–60)
pH, Ven: 7.394 (ref 7.25–7.43)
pO2, Ven: 38 mmHg (ref 32–45)

## 2024-10-03 LAB — I-STAT CHEM 8, ED
BUN: 32 mg/dL — ABNORMAL HIGH (ref 6–20)
Calcium, Ion: 1.09 mmol/L — ABNORMAL LOW (ref 1.15–1.40)
Chloride: 92 mmol/L — ABNORMAL LOW (ref 98–111)
Creatinine, Ser: 1 mg/dL (ref 0.61–1.24)
Glucose, Bld: 325 mg/dL — ABNORMAL HIGH (ref 70–99)
HCT: 52 % (ref 39.0–52.0)
Hemoglobin: 17.7 g/dL — ABNORMAL HIGH (ref 13.0–17.0)
Potassium: 4 mmol/L (ref 3.5–5.1)
Sodium: 131 mmol/L — ABNORMAL LOW (ref 135–145)
TCO2: 24 mmol/L (ref 22–32)

## 2024-10-03 LAB — COMPREHENSIVE METABOLIC PANEL WITH GFR
ALT: 21 U/L (ref 0–44)
AST: 19 U/L (ref 15–41)
Albumin: 4 g/dL (ref 3.5–5.0)
Alkaline Phosphatase: 106 U/L (ref 38–126)
Anion gap: 17 — ABNORMAL HIGH (ref 5–15)
BUN: 28 mg/dL — ABNORMAL HIGH (ref 6–20)
CO2: 24 mmol/L (ref 22–32)
Calcium: 8.7 mg/dL — ABNORMAL LOW (ref 8.9–10.3)
Chloride: 90 mmol/L — ABNORMAL LOW (ref 98–111)
Creatinine, Ser: 1.1 mg/dL (ref 0.61–1.24)
GFR, Estimated: >60 mL/min (ref >60)
Glucose, Bld: 318 mg/dL — ABNORMAL HIGH (ref 70–99)
Potassium: 3.9 mmol/L (ref 3.5–5.1)
Sodium: 131 mmol/L — ABNORMAL LOW (ref 135–145)
Total Bilirubin: 2.1 mg/dL — ABNORMAL HIGH (ref 0.0–1.2)
Total Protein: 7.6 g/dL (ref 6.5–8.1)

## 2024-10-03 LAB — CBC
HCT: 49.2 % (ref 39.0–52.0)
HCT: 43.7 % (ref 39.0–52.0)
Hemoglobin: 17.1 g/dL — ABNORMAL HIGH (ref 13.0–17.0)
Hemoglobin: 15.7 g/dL (ref 13.0–17.0)
MCH: 29.3 pg (ref 26.0–34.0)
MCH: 29.8 pg (ref 26.0–34.0)
MCHC: 34.8 g/dL (ref 30.0–36.0)
MCHC: 35.9 g/dL (ref 30.0–36.0)
MCV: 84.4 fL (ref 80.0–100.0)
MCV: 83.1 fL (ref 80.0–100.0)
Platelets: 345 K/uL (ref 150–400)
Platelets: 349 K/uL (ref 150–400)
RBC: 5.83 MIL/uL — ABNORMAL HIGH (ref 4.22–5.81)
RBC: 5.26 MIL/uL (ref 4.22–5.81)
RDW: 12.9 % (ref 11.5–15.5)
RDW: 12.6 % (ref 11.5–15.5)
WBC: 18.4 K/uL — ABNORMAL HIGH (ref 4.0–10.5)
WBC: 18.6 K/uL — ABNORMAL HIGH (ref 4.0–10.5)
nRBC: 0 % (ref 0.0–0.2)
nRBC: 0 % (ref 0.0–0.2)

## 2024-10-03 LAB — I-STAT CG4 LACTIC ACID, ED
Lactic Acid, Venous: 2 mmol/L (ref 0.5–1.9)
Lactic Acid, Venous: 2.6 mmol/L (ref 0.5–1.9)

## 2024-10-03 LAB — CBG MONITORING, ED
Glucose-Capillary: 147 mg/dL — ABNORMAL HIGH (ref 70–99)
Glucose-Capillary: 178 mg/dL — ABNORMAL HIGH (ref 70–99)
Glucose-Capillary: 262 mg/dL — ABNORMAL HIGH (ref 70–99)
Glucose-Capillary: 292 mg/dL — ABNORMAL HIGH (ref 70–99)
Glucose-Capillary: 371 mg/dL — ABNORMAL HIGH (ref 70–99)

## 2024-10-03 LAB — BASIC METABOLIC PANEL WITH GFR
Anion gap: 14 (ref 5–15)
BUN: 27 mg/dL — ABNORMAL HIGH (ref 6–20)
CO2: 23 mmol/L (ref 22–32)
Calcium: 8.2 mg/dL — ABNORMAL LOW (ref 8.9–10.3)
Chloride: 93 mmol/L — ABNORMAL LOW (ref 98–111)
Creatinine, Ser: 0.91 mg/dL (ref 0.61–1.24)
GFR, Estimated: >60 mL/min (ref >60)
Glucose, Bld: 160 mg/dL — ABNORMAL HIGH (ref 70–99)
Potassium: 4.3 mmol/L (ref 3.5–5.1)
Sodium: 130 mmol/L — ABNORMAL LOW (ref 135–145)

## 2024-10-03 LAB — LIPASE, BLOOD: Lipase: 21 U/L (ref 11–51)

## 2024-10-03 LAB — BETA-HYDROXYBUTYRIC ACID
Beta-Hydroxybutyric Acid: 5.67 mmol/L — ABNORMAL HIGH (ref 0.05–0.27)
Beta-Hydroxybutyric Acid: 2.41 mmol/L — ABNORMAL HIGH (ref 0.05–0.27)

## 2024-10-03 MED ORDER — DEXTROSE IN LACTATED RINGERS 5 % IV SOLN: INTRAVENOUS | Status: AC

## 2024-10-03 MED ORDER — LACTATED RINGERS IV BOLUS
1000.0000 mL | Freq: Once | INTRAVENOUS | Status: DC
  Administered 2024-10-03: 1000 mL via INTRAVENOUS

## 2024-10-03 MED ORDER — DEXTROSE IN LACTATED RINGERS 5 % IV SOLN: INTRAVENOUS | Status: DC

## 2024-10-03 MED ORDER — INSULIN REGULAR(HUMAN) IN NACL 100-0.9 UT/100ML-% IV SOLN
INTRAVENOUS | Status: DC
  Administered 2024-10-03: 10 [IU]/h via INTRAVENOUS
  Filled 2024-10-03: qty 100

## 2024-10-03 MED ORDER — SODIUM CHLORIDE 0.9 % IV BOLUS: 1000.0000 mL | Freq: Once | INTRAVENOUS | Status: DC

## 2024-10-03 MED ORDER — ENOXAPARIN SODIUM 40 MG/0.4ML IJ SOSY: 40.0000 mg | PREFILLED_SYRINGE | INTRAMUSCULAR | Status: DC

## 2024-10-03 MED ORDER — ONDANSETRON HCL 4 MG/2ML IJ SOLN
4.0000 mg | Freq: Once | INTRAMUSCULAR | Status: AC
  Administered 2024-10-03: 4 mg via INTRAVENOUS
  Filled 2024-10-03: qty 2

## 2024-10-03 MED ORDER — INSULIN REGULAR(HUMAN) IN NACL 100-0.9 UT/100ML-% IV SOLN
INTRAVENOUS | Status: DC
  Administered 2024-10-03: 2.4 [IU]/h via INTRAVENOUS
  Administered 2024-10-04: 2.6 [IU]/h via INTRAVENOUS
  Filled 2024-10-03: qty 100

## 2024-10-03 MED ORDER — DEXTROSE 50 % IV SOLN: 0.0000 mL | INTRAVENOUS | Status: DC | PRN

## 2024-10-03 MED ORDER — LACTATED RINGERS IV SOLN: INTRAVENOUS | Status: AC

## 2024-10-03 MED ORDER — POTASSIUM CHLORIDE 10 MEQ/100ML IV SOLN
10.0000 meq | INTRAVENOUS | Status: AC
  Administered 2024-10-03 (×2): 10 meq via INTRAVENOUS
  Filled 2024-10-03 (×2): qty 100

## 2024-10-03 MED ORDER — LACTATED RINGERS IV SOLN: INTRAVENOUS | Status: DC

## 2024-10-03 MED ORDER — LACTATED RINGERS IV BOLUS
20.0000 mL/kg | Freq: Once | INTRAVENOUS | Status: AC
  Administered 2024-10-03: 1360 mL via INTRAVENOUS

## 2024-10-03 NOTE — ED Notes (Signed)
 Pt laying himself in the floor at triage by choice instead of in the chair that he was advised would recline if he wanted it to.

## 2024-10-03 NOTE — ED Provider Notes (Signed)
 [place] EMERGENCY DEPARTMENT AT [facility] Provider Note   CSN: [id number] Arrival date & time: 10/03/24  1625     Patient presents with: Emesis   Kevin Russell is a 38 y.o. male with DM 1 with history of DKA presents with complaints of nausea, vomiting and diarrhea for the past 2 days.  States he feels generally weak.  No focal pain.  Reports compliance with his insulin .    Emesis  Past Medical History:  Diagnosis Date   Diabetes mellitus without complication (HCC)    type 1   Suicidal ideation    History reviewed. No pertinent surgical history.     Prior to Admission medications   Medication Sig Start Date End Date Taking? Authorizing Provider  insulin  NPH-regular Human (70-30) 100 UNIT/ML injection Inject 17 Units into the skin 2 (two) times daily with a meal. 07/30/24  Yes Smucker, Melvenia, MD    Allergies: Patient has no allergy information on record.    Review of Systems  Gastrointestinal:  Positive for vomiting.    Updated Vital Signs BP (!) 155/82 (BP Location: Right Arm)   Pulse (!) 130   Temp 98.3 F (36.8 C) (Oral)   Resp 18   Ht 5' 11 (1.803 m)   Wt 68 kg   SpO2 100%   BMI 20.92 kg/m   Physical Exam Vitals and nursing note reviewed.  Constitutional:      General: He is not in acute distress.    Appearance: He is well-developed.  HENT:     Head: Normocephalic and atraumatic.  Eyes:     Conjunctiva/sclera: Conjunctivae normal.  Cardiovascular:     Rate and Rhythm: Regular rhythm. Tachycardia present.     Heart sounds: No murmur heard. Pulmonary:     Effort: Pulmonary effort is normal. No respiratory distress.     Breath sounds: Normal breath sounds.  Abdominal:     Palpations: Abdomen is soft.     Tenderness: There is no abdominal tenderness.  Musculoskeletal:        General: No swelling.     Cervical back: Neck supple.  Skin:    General: Skin is warm and dry.     Capillary Refill: Capillary refill takes less than 2  seconds.  Neurological:     Mental Status: He is alert.  Psychiatric:        Mood and Affect: Mood normal.     (all labs ordered are listed, but only abnormal results are displayed) Labs Reviewed  COMPREHENSIVE METABOLIC PANEL WITH GFR - Abnormal; Notable for the following components:      Result Value   Sodium 131 (*)    Chloride 90 (*)    Glucose, Bld 318 (*)    BUN 28 (*)    Calcium  8.7 (*)    Total Bilirubin 2.1 (*)    Anion gap 17 (*)    All other components within normal limits  CBC - Abnormal; Notable for the following components:   WBC 18.4 (*)    RBC 5.83 (*)    Hemoglobin 17.1 (*)    All other components within normal limits  BETA-HYDROXYBUTYRIC ACID - Abnormal; Notable for the following components:   Beta-Hydroxybutyric Acid 5.67 (*)    All other components within normal limits  CBG MONITORING, ED - Abnormal; Notable for the following components:   Glucose-Capillary 292 (*)    All other components within normal limits  CBG MONITORING, ED - Abnormal; Notable for the following components:  Glucose-Capillary 371 (*)    All other components within normal limits  I-STAT CHEM 8, ED - Abnormal; Notable for the following components:   Sodium 131 (*)    Chloride 92 (*)    BUN 32 (*)    Glucose, Bld 325 (*)    Calcium , Ion 1.09 (*)    Hemoglobin 17.7 (*)    All other components within normal limits  I-STAT VENOUS BLOOD GAS, ED - Abnormal; Notable for the following components:   pCO2, Ven 37.4 (*)    Sodium 127 (*)    Calcium , Ion 1.04 (*)    Hemoglobin 17.3 (*)    All other components within normal limits  I-STAT CG4 LACTIC ACID, ED - Abnormal; Notable for the following components:   Lactic Acid, Venous 2.0 (*)    All other components within normal limits  CULTURE, BLOOD (ROUTINE X 2)  CULTURE, BLOOD (ROUTINE X 2)  LIPASE, BLOOD  URINALYSIS, ROUTINE W REFLEX MICROSCOPIC  CBG MONITORING, ED  I-STAT CG4 LACTIC ACID, ED    EKG: EKG  Interpretation Date/Time:  Wednesday October 03 2024 17:42:13 EST Ventricular Rate:  125 PR Interval:  130 QRS Duration:  92 QT Interval:  308 QTC Calculation: 444 R Axis:   93  Text Interpretation: Sinus tachycardia Right atrial enlargement Rightward axis Pulmonary disease pattern Abnormal ECG No significant change since last tracing Confirmed by Ellouise Fine ([id number]) on 10/03/2024 8:15:08 PM  Radiology: No results found.   .Critical Care  Performed by: Donnajean Lynwood DEL, PA-C Authorized by: Donnajean Lynwood DEL, PA-C   Critical care provider statement:    Critical care time (minutes):  30   Critical care was necessary to treat or prevent imminent or life-threatening deterioration of the following conditions:  Dehydration and metabolic crisis   Critical care was time spent personally by me on the following activities:  Development of treatment plan with patient or surrogate, discussions with consultants, evaluation of patient's response to treatment, examination of patient, ordering and review of laboratory studies, ordering and review of radiographic studies, ordering and performing treatments and interventions, pulse oximetry, re-evaluation of patient's condition and review of old charts   Care discussed with: admitting provider      Medications Ordered in the ED  insulin  regular, human (MYXREDLIN ) 100 units/ 100 mL infusion (10 Units/hr Intravenous New Bag/Given 10/03/24 2011)  lactated ringers  infusion (0 mLs Intravenous Hold 10/03/24 2006)  dextrose  5 % in lactated ringers  infusion (0 mLs Intravenous Hold 10/03/24 2007)  dextrose  50 % solution 0-50 mL (has no administration in time range)  potassium chloride  10 mEq in 100 mL IVPB (has no administration in time range)  lactated ringers  bolus 1,000 mL (has no administration in time range)  ondansetron  (ZOFRAN ) injection 4 mg (4 mg Intravenous Given 10/03/24 1955)  lactated ringers  bolus 1,360 mL (1,360 mLs Intravenous New  Bag/Given 10/03/24 2006)    Clinical Course as of 10/03/24 2112  Wed Oct 03, 2024  1925 Patient with history of type 1 diabetes and DKA presents with generalized weakness, nausea and vomiting.  Upon arrival patient is lethargic and tachycardic to 130.  Without any chest pain shortness of breath or abdominal pain.  His abdominal exam is benign.  Reports compliance with his insulin  [JT]  2032 Workup overall notable for leukocytosis of 18, beta hydroxybutyric acid of 5.67, glucose of 325 anion gap of 17.  ABG with pH 7.3.  Lactate 2.0.   [JT]  2033 Overall consistent with DKA.  Will  pursue medicine admission.  At this time patient is receiving insulin  and fluids [JT]  2044 Discussed with hospitalist.  Agreed for admission [JT]    Clinical Course User Index [JT] Donnajean Lynwood DEL, PA-C                                 Medical Decision Making Amount and/or Complexity of Data Reviewed Labs: ordered.  Risk Prescription drug management. Decision regarding hospitalization.   This patient presents to the ED with chief complaint(s) of weakness .  The complaint involves an extensive differential diagnosis and also carries with it a high risk of complications and morbidity.   Pertinent past medical history as listed in HPI  The differential diagnosis includes  Based off exam and history do not suspect cannabis hyperemesis, appendicitis or other acute abdominal pathology Additional history obtained: Records reviewed Care Everywhere/External Records  Disposition:   Patient will be admitted for further workup and management.  Social Determinants of Health:   none  This note was dictated with voice recognition software.  Despite best efforts at proofreading, errors may have occurred which can change the documentation meaning.       Final diagnoses:  Diabetic ketoacidosis without coma associated with type 1 diabetes mellitus [facility])    ED Discharge Orders     None           Donnajean Lynwood DEL, PA-C 10/03/24 2112    Kingsley, Victoria K, DO 10/03/24 2138

## 2024-10-03 NOTE — ED Provider Triage Note (Signed)
 Emergency Medicine Provider Triage Evaluation Note  CHAY MAZZONI , a 38 y.o. male  was evaluated in triage.  Pt complains of generalized weakness with persistent nausea and vomiting over the past few days.  No focal pain.  History of DKA.  Has been reportedly compliant with his insulin ..  Review of Systems  Positive:  Negative:   Physical Exam  BP (!) 121/91 (BP Location: Right Arm)   Pulse (!) 127   Temp 98.2 F (36.8 C)   Resp 19   Ht 5' 11 (1.803 m)   Wt 68 kg   SpO2 100%   BMI 20.92 kg/m  Gen:   Awake, no distress   Resp:  Normal effort  MSK:   Moves extremities without difficulty  Other:  No focal abdominal tenderness  Medical Decision Making  Medically screening exam initiated at 5:30 PM.  Appropriate orders placed.  Caron LITTIE Bihari was informed that the remainder of the evaluation will be completed by another provider, this initial triage assessment does not replace that evaluation, and the importance of remaining in the ED until their evaluation is complete.     Donnajean Lynwood DEL, PA-C 10/03/24 1731

## 2024-10-03 NOTE — ED Triage Notes (Signed)
 Pt to er, pt states that Monday night he stated having some nausea and vomiting, states that he doesn't know if it his dm or if it was something he ate, states that he is also having diarrhea.  Pt denies pain.

## 2024-10-03 NOTE — ED Triage Notes (Signed)
 Pt came in via POV d/t the last 2 days having n/v/d. Endorses that he doesn't know if he is having s/s of a bug or if this is d/t his DM. States he has been taking his insulin  along with his n/v & not checking his sugars regularly at home. A/Ox4, denies any acute pain.

## 2024-10-03 NOTE — ED Notes (Signed)
 Pt not able to give UA during triage.

## 2024-10-03 NOTE — H&P (Addendum)
 History and Physical    WRANGLER PENNING FMW:[id number] DOB: 01-Dec-1985 DOA: 10/03/2024  Patient coming from: Home.  Chief Complaint: Nausea vomiting and diarrhea.  HPI: Kevin Russell is a 38 y.o. male with history of diabetes mellitus type 1 comes to the ER with complaints of intractable nausea vomiting and diarrhea ongoing for last 2 days.  Denies any recent travel or sick contacts but patient is not sure since patient states he works in [employer].  Has not taken any recent antibiotics and states that he has not missed his NPH 70/30 15 units twice daily which he has been taking.  ED Course: In the ER patient was tachycardic.  Labs show blood glucose of 318 anion gap of 17 beta-hydroxybutyrate acid of 5.67.  WBC 18.4 hemoglobin 17.1.  Creatinine 1.  AST ALT were normal.  Patient given the elevated anion gap and beta-hydroxybutyrate acid was started on IV insulin  infusion for treating DKA and admitted for further management.  Review of Systems: As per HPI, rest all negative.   Past Medical History:  Diagnosis Date   Diabetes mellitus without complication (HCC)    type 1   Suicidal ideation     History reviewed. No pertinent surgical history.   reports that he has been smoking cigarettes. He has never used smokeless tobacco. He reports current alcohol use. He reports current drug use. Drug: Marijuana.  Not on File  History reviewed. No pertinent family history.  Prior to Admission medications   Medication Sig Start Date End Date Taking? Authorizing Provider  insulin  NPH-regular Human (70-30) 100 UNIT/ML injection Inject 17 Units into the skin 2 (two) times daily with a meal. 07/30/24  Yes Smucker, Melvenia, MD    Physical Exam: Constitutional: Moderately built and nourished. Vitals:   10/03/24 2103 10/03/24 2115 10/03/24 2130 10/03/24 2137  BP:    117/86  Pulse: (!) 130 (!) 126 (!) 114 (!) 118  Resp: 18 19 14 17   Temp:    98.5 F (36.9 C)  TempSrc:      SpO2: 100%  100% 99% 100%  Weight:      Height:       Eyes: Anicteric no pallor. ENMT: No discharge from the ears eyes nose or mouth. Neck: No mass felt.  No neck rigidity. Respiratory: No rhonchi or crepitations. Cardiovascular: S1-S2 heard. Abdomen: Soft nontender bowel sound present. Musculoskeletal: No edema. Skin: No rash. Neurologic: Alert awake oriented time place and person.  Moves all extremities. Psychiatric: Appears normal.  Normal affect.   Labs on Admission: I have personally reviewed following labs and imaging studies  CBC: Recent Labs  Lab 10/03/24 1715 10/03/24 1738 10/03/24 2009  WBC 18.4*  --   --   HGB 17.1* 17.7* 17.3*  HCT 49.2 52.0 51.0  MCV 84.4  --   --   PLT 345  --   --    Basic Metabolic Panel: Recent Labs  Lab 10/03/24 1715 10/03/24 1738 10/03/24 2009  NA 131* 131* 127*  K 3.9 4.0 4.4  CL 90* 92*  --   CO2 24  --   --   GLUCOSE 318* 325*  --   BUN 28* 32*  --   CREATININE 1.10 1.00  --   CALCIUM  8.7*  --   --    GFR: Estimated Creatinine Clearance: 94.4 mL/min (by C-G formula based on SCr of 1 mg/dL). Liver Function Tests: Recent Labs  Lab 10/03/24 1715  AST 19  ALT 21  ALKPHOS  106  BILITOT 2.1*  PROT 7.6  ALBUMIN 4.0   Recent Labs  Lab 10/03/24 1715  LIPASE 21   No results for input(s): AMMONIA in the last 168 hours. Coagulation Profile: No results for input(s): INR, PROTIME in the last 168 hours. Cardiac Enzymes: No results for input(s): CKTOTAL, CKMB, CKMBINDEX, TROPONINI in the last 168 hours. BNP (last 3 results) No results for input(s): PROBNP in the last 8760 hours. HbA1C: No results for input(s): HGBA1C in the last 72 hours. CBG: Recent Labs  Lab 10/03/24 1714 10/03/24 1952 10/03/24 2118  GLUCAP 292* 371* 262*   Lipid Profile: No results for input(s): CHOL, HDL, LDLCALC, TRIG, CHOLHDL, LDLDIRECT in the last 72 hours. Thyroid Function Tests: No results for input(s): TSH,  T4TOTAL, FREET4, T3FREE, THYROIDAB in the last 72 hours. Anemia Panel: No results for input(s): VITAMINB12, FOLATE, FERRITIN, TIBC, IRON, RETICCTPCT in the last 72 hours. Urine analysis:    Component Value Date/Time   COLORURINE YELLOW 08/04/2024 1654   APPEARANCEUR CLEAR 08/04/2024 1654   LABSPEC 1.041 (H) 08/04/2024 1654   PHURINE 6.5 08/04/2024 1654   GLUCOSEU >1,000 (A) 08/04/2024 1654   HGBUR NEGATIVE 08/04/2024 1654   BILIRUBINUR NEGATIVE 08/04/2024 1654   KETONESUR 15 (A) 08/04/2024 1654   PROTEINUR NEGATIVE 08/04/2024 1654   UROBILINOGEN 0.2 06/01/2012 0916   NITRITE NEGATIVE 08/04/2024 1654   LEUKOCYTESUR NEGATIVE 08/04/2024 1654   Sepsis Labs: @LABRCNTIP (procalcitonin:4,lacticidven:4) )No results found for this or any previous visit (from the past 240 hours).   Radiological Exams on Admission: No results found.  EKG: Independently reviewed.  Sinus tachycardia.  Assessment/Plan Active Problems:   Leucocytosis   Erythrocytosis   Diabetic ketoacidosis (HCC)   DKA, type 1 (HCC)    Diabetic ketoacidosis type 1 diabetes last hemoglobin A1c was 11.9 on October 2025.  Precipitating cause not clear could be from nausea vomiting/gastroenteritis.  Patient states he has been taking his insulin .  Presently on insulin  infusion IV fluids closely follow metabolic panel.  Advance diet as tolerated. Nausea vomiting and diarrhea could be from DKA could be from gastroenteritis.  If there is any further episodes of diarrhea will check stool studies.  UDS pending.  Total bilirubin is slightly high follow LFTs. Leukocytosis likely from demargination.  No definite signs of infection.  Follow CBC. Erythrocytosis likely from dehydration.  Follow CBC.   DVT prophylaxis: Lovenox . Code Status: Full code. Family Communication: Discussed with patient. Disposition Plan: Progressive care. Consults called: None. Admission status: Observation.

## 2024-10-03 NOTE — ED Notes (Signed)
 CCMD called.

## 2024-10-04 DIAGNOSIS — R197 Diarrhea, unspecified: Secondary | ICD-10-CM | POA: Insufficient documentation

## 2024-10-04 LAB — CBG MONITORING, ED
Glucose-Capillary: 124 mg/dL — ABNORMAL HIGH (ref 70–99)
Glucose-Capillary: 126 mg/dL — ABNORMAL HIGH (ref 70–99)
Glucose-Capillary: 142 mg/dL — ABNORMAL HIGH (ref 70–99)
Glucose-Capillary: 162 mg/dL — ABNORMAL HIGH (ref 70–99)
Glucose-Capillary: 165 mg/dL — ABNORMAL HIGH (ref 70–99)
Glucose-Capillary: 167 mg/dL — ABNORMAL HIGH (ref 70–99)
Glucose-Capillary: 171 mg/dL — ABNORMAL HIGH (ref 70–99)
Glucose-Capillary: 171 mg/dL — ABNORMAL HIGH (ref 70–99)
Glucose-Capillary: 173 mg/dL — ABNORMAL HIGH (ref 70–99)
Glucose-Capillary: 174 mg/dL — ABNORMAL HIGH (ref 70–99)
Glucose-Capillary: 174 mg/dL — ABNORMAL HIGH (ref 70–99)
Glucose-Capillary: 174 mg/dL — ABNORMAL HIGH (ref 70–99)
Glucose-Capillary: 192 mg/dL — ABNORMAL HIGH (ref 70–99)
Glucose-Capillary: 196 mg/dL — ABNORMAL HIGH (ref 70–99)
Glucose-Capillary: 198 mg/dL — ABNORMAL HIGH (ref 70–99)

## 2024-10-04 LAB — BASIC METABOLIC PANEL WITH GFR
Anion gap: 9 (ref 5–15)
Anion gap: 6 (ref 5–15)
Anion gap: 6 (ref 5–15)
Anion gap: 9 (ref 5–15)
BUN: 22 mg/dL — ABNORMAL HIGH (ref 6–20)
BUN: 20 mg/dL (ref 6–20)
BUN: 19 mg/dL (ref 6–20)
BUN: 17 mg/dL (ref 6–20)
CO2: 26 mmol/L (ref 22–32)
CO2: 28 mmol/L (ref 22–32)
CO2: 29 mmol/L (ref 22–32)
CO2: 27 mmol/L (ref 22–32)
Calcium: 7.9 mg/dL — ABNORMAL LOW (ref 8.9–10.3)
Calcium: 8.1 mg/dL — ABNORMAL LOW (ref 8.9–10.3)
Calcium: 8.3 mg/dL — ABNORMAL LOW (ref 8.9–10.3)
Calcium: 8 mg/dL — ABNORMAL LOW (ref 8.9–10.3)
Chloride: 95 mmol/L — ABNORMAL LOW (ref 98–111)
Chloride: 99 mmol/L (ref 98–111)
Chloride: 97 mmol/L — ABNORMAL LOW (ref 98–111)
Chloride: 98 mmol/L (ref 98–111)
Creatinine, Ser: 0.85 mg/dL (ref 0.61–1.24)
Creatinine, Ser: 0.77 mg/dL (ref 0.61–1.24)
Creatinine, Ser: 0.88 mg/dL (ref 0.61–1.24)
Creatinine, Ser: 0.85 mg/dL (ref 0.61–1.24)
GFR, Estimated: >60 mL/min (ref >60)
GFR, Estimated: >60 mL/min (ref >60)
GFR, Estimated: >60 mL/min (ref >60)
GFR, Estimated: >60 mL/min (ref >60)
Glucose, Bld: 173 mg/dL — ABNORMAL HIGH (ref 70–99)
Glucose, Bld: 125 mg/dL — ABNORMAL HIGH (ref 70–99)
Glucose, Bld: 177 mg/dL — ABNORMAL HIGH (ref 70–99)
Glucose, Bld: 199 mg/dL — ABNORMAL HIGH (ref 70–99)
Potassium: 4.3 mmol/L (ref 3.5–5.1)
Potassium: 4.2 mmol/L (ref 3.5–5.1)
Potassium: 3.7 mmol/L (ref 3.5–5.1)
Potassium: 3.8 mmol/L (ref 3.5–5.1)
Sodium: 130 mmol/L — ABNORMAL LOW (ref 135–145)
Sodium: 133 mmol/L — ABNORMAL LOW (ref 135–145)
Sodium: 132 mmol/L — ABNORMAL LOW (ref 135–145)
Sodium: 134 mmol/L — ABNORMAL LOW (ref 135–145)

## 2024-10-04 LAB — GLUCOSE, CAPILLARY
Glucose-Capillary: 173 mg/dL — ABNORMAL HIGH (ref 70–99)
Glucose-Capillary: 180 mg/dL — ABNORMAL HIGH (ref 70–99)
Glucose-Capillary: 202 mg/dL — ABNORMAL HIGH (ref 70–99)
Glucose-Capillary: 93 mg/dL (ref 70–99)

## 2024-10-04 LAB — URINALYSIS, ROUTINE W REFLEX MICROSCOPIC
Bilirubin Urine: NEGATIVE
Glucose, UA: >=500 mg/dL — AB
Hgb urine dipstick: NEGATIVE
Ketones, ur: >80 mg/dL — AB
Leukocytes,Ua: NEGATIVE
Nitrite: NEGATIVE
Protein, ur: NEGATIVE mg/dL
Specific Gravity, Urine: 1.01 (ref 1.005–1.030)
pH: 6 (ref 5.0–8.0)

## 2024-10-04 LAB — URINALYSIS, MICROSCOPIC (REFLEX): Bacteria, UA: NONE SEEN

## 2024-10-04 LAB — RAPID URINE DRUG SCREEN, HOSP PERFORMED
Amphetamines: NOT DETECTED
Barbiturates: NOT DETECTED
Benzodiazepines: NOT DETECTED
Cocaine: NOT DETECTED
Opiates: NOT DETECTED
Tetrahydrocannabinol: POSITIVE — AB

## 2024-10-04 LAB — HEMOGLOBIN A1C
Hgb A1c MFr Bld: 11.2 % — ABNORMAL HIGH (ref 4.8–5.6)
Mean Plasma Glucose: 274.74 mg/dL

## 2024-10-04 LAB — BETA-HYDROXYBUTYRIC ACID
Beta-Hydroxybutyric Acid: 1.59 mmol/L — ABNORMAL HIGH (ref 0.05–0.27)
Beta-Hydroxybutyric Acid: 0.32 mmol/L — ABNORMAL HIGH (ref 0.05–0.27)
Beta-Hydroxybutyric Acid: 0.67 mmol/L — ABNORMAL HIGH (ref 0.05–0.27)
Beta-Hydroxybutyric Acid: 0.46 mmol/L — ABNORMAL HIGH (ref 0.05–0.27)

## 2024-10-04 MED ORDER — INSULIN ASPART PROT & ASPART (70-30 MIX) 100 UNIT/ML ~~LOC~~ SUSP
17.0000 [IU] | Freq: Two times a day (BID) | SUBCUTANEOUS | Status: DC
  Administered 2024-10-04 – 2024-10-05 (×2): 17 [IU] via SUBCUTANEOUS
  Filled 2024-10-04: qty 10

## 2024-10-04 MED ORDER — INSULIN ASPART 100 UNIT/ML IJ SOLN: 0.0000 [IU] | Freq: Every day | INTRAMUSCULAR | Status: DC

## 2024-10-04 MED ORDER — SALINE SPRAY 0.65 % NA SOLN
1.0000 | NASAL | Status: DC | PRN
  Filled 2024-10-04 (×2): qty 44

## 2024-10-04 MED ORDER — PSEUDOEPHEDRINE HCL 30 MG PO TABS
30.0000 mg | ORAL_TABLET | Freq: Two times a day (BID) | ORAL | Status: DC | PRN
  Administered 2024-10-04: 30 mg via ORAL
  Filled 2024-10-04 (×3): qty 1

## 2024-10-04 MED ORDER — ONDANSETRON HCL 4 MG/2ML IJ SOLN
4.0000 mg | Freq: Once | INTRAMUSCULAR | Status: AC
  Administered 2024-10-04: 4 mg via INTRAVENOUS
  Filled 2024-10-04: qty 2

## 2024-10-04 MED ORDER — INSULIN ASPART 100 UNIT/ML IJ SOLN
0.0000 [IU] | Freq: Three times a day (TID) | INTRAMUSCULAR | Status: DC
  Administered 2024-10-05: 5 [IU] via SUBCUTANEOUS
  Filled 2024-10-04: qty 5

## 2024-10-04 NOTE — ED Notes (Signed)
 Gave pt urinal to try and provide urine sample.

## 2024-10-04 NOTE — Inpatient Diabetes Management (Signed)
 Inpatient Diabetes Program Recommendations  AACE/ADA: New Consensus Statement on Inpatient Glycemic Control (2015)  Target Ranges:  Prepandial:   less than 140 mg/dL      Peak postprandial:   less than 180 mg/dL (1-2 hours)      Critically ill patients:  140 - 180 mg/dL   Lab Results  Component Value Date   GLUCAP 173 (H) 10/04/2024   HGBA1C 11.9 (H) 07/29/2024    Review of Glycemic Control  Latest Reference Range & Units 10/04/24 03:08 10/04/24 04:30 10/04/24 05:37 10/04/24 06:36 10/04/24 07:45 10/04/24 08:47  Glucose-Capillary 70 - 99 mg/dL 828 (H) 825 (H) 875 (H) 192 (H) 198 (H) 173 (H)   Diabetes history: DM 1 Outpatient Diabetes medications:  Novolin 70/30 17 units bid Current orders for Inpatient glycemic control:  IV insulin - goal 140-180  Inpatient Diabetes Program Recommendations:    Note patient admitted for DKA.  Will follow.   Thanks,  Randall Bullocks, RN, BC-ADM Inpatient Diabetes Coordinator Pager [phone]  (8a-5p)

## 2024-10-04 NOTE — Progress Notes (Signed)
° ° °  EXPEDITER LEVEL LOADING ASSESSMENT NOTE  Patient Name: Kevin Russell  DOB:06/17/1986 Date of Admission: 10/03/2024  Date of Assessment:10/04/2024   -------------------------------------------------------------------------------------------------------------------   Brief clinical summary: Patient being admitted with DKA and on insulin  gtt.  Is there Bed Availability at another [facility]? No  Level of Care Needed:  Yes, Progressive d/t gtt.  Chart Reviewed:  Yes     -------------------------------------------------------------------------------------------------------------------  [facility] RN Expediter, Philippa Vessey Please contact us  directly via secure chat (search for [facility]) or by calling us  at [phone] [facility]).

## 2024-10-04 NOTE — Progress Notes (Signed)
 PROGRESS NOTE  Kevin Russell  DOB: 01-Sep-1986  PCP: Freddrick Johns FMW:[id number]  DOA: 10/03/2024  LOS: 0 days  Hospital Day: 2  Subjective: Patient was seen and examined this morning. Pleasant young Caucasian male.  Lying on bed. Feels nauseous.  Has not had vomiting or diarrhea since last night. Wants to try only liquid today.  Brief narrative: Kevin Russell is a 38 y.o. male with PMH significant for DM1, episodes of DKA, h/o suicidal ideation, chronic smoking, chronic alcohol use.  12/10, patient presented to ED with complaint of intractable nausea, vomiting, diarrhea for 2 days.  Denies any sick contacts but works in [employer].. Reports compliance to insulin  regimen at home which is NPH 70/30 insulin  15 units twice daily.  Workup in the ED showed elevated blood glucose of 318, and gap of 17, ketone of 5.67 pH was 7.38, Given persistent symptoms, elevated anion gap and elevated ketones, patient was started on management as DKA Started on IV insulin  infusion, IV fluid Admitted to [facility]   Assessment and plan: DKA Presented with intractable nausea, vomiting, diarrhea Denies any sick contact but could have had an exposure as he works in [employer] Workup as above suggestive of DKA Started on IV insulin  infusion, IV fluid, BMP monitoring Last blood work from this morning with sodium 133, potassium 4.2, anion gap improved to 6, serum ketone level nearly corrected But patient still is nauseous and only wants to try liquid diet. For now, I would continue insulin  drip.  Once his oral intake is reliable, plan to switch to subcutaneous insulin .  Type 1 diabetes mellitus uncontrolled A1c 11.9 on 07/29/2024 But reports compliance to home insulin  regimen at home which is NPH 70/30 insulin  15 units twice daily. Initially started on insulin  drip. Plan for subcutaneous insulin  as above. Recent Labs  Lab 10/04/24 0745 10/04/24 0847 10/04/24 0953 10/04/24 1045 10/04/24 1147  GLUCAP  198* 173* 171* 174* 174*   Intractable nausea vomiting diarrhea Suspect viral gastroenteritis versus gastroparesis Remains nauseous.  No vomiting or diarrhea since yesterday. Continue to monitor  Leukocytosis Lactic acidosis Elevated WBC count and lactic acid likely due to dehydration. Repeat labs Recent Labs  Lab 10/03/24 1715 10/03/24 2010 10/03/24 2240 10/03/24 2321  WBC 18.4*  --  18.6*  --   LATICACIDVEN  --  2.0*  --  2.6*    Nutrition Status:         Mobility: Independent at baseline.  Encourage ambulation  Goals of care   Code Status: Full Code     DVT prophylaxis:  enoxaparin  (LOVENOX ) injection 40 mg Start: 10/04/24 1000   Antimicrobials: None Fluid: Per protocol Consultants: None Family Communication: None at bedside  Status: Inpatient Level of care:  Progressive   Patient is from: Home Home Needs to continue in-hospital care: Remains on IV insulin  drip Anticipated d/c to: Home hopefully tomorrow      Diet:  Diet Order             Diet clear liquid Room service appropriate? Yes; Fluid consistency: Thin  Diet effective now                   Scheduled Meds:  enoxaparin  (LOVENOX ) injection  40 mg Subcutaneous Q24H    PRN meds: dextrose , sodium chloride    Infusions:   dextrose  5% lactated ringers  125 mL/hr at 10/04/24 0701   insulin  2.6 Units/hr (10/04/24 1048)   lactated ringers  Stopped (10/03/24 2244)    Antimicrobials: Anti-infectives (From  admission, onward)    None       Objective: Vitals:   10/04/24 0945 10/04/24 1030  BP: 94/72 113/85  Pulse: (!) 104 90  Resp: 14 19  Temp:    SpO2: 100% 100%    Intake/Output Summary (Last 24 hours) at 10/04/2024 1201 Last data filed at 10/04/2024 0505 Gross per 24 hour  Intake --  Output 800 ml  Net -800 ml   Filed Weights   10/03/24 1711  Weight: 68 kg   Weight change:  Body mass index is 20.92 kg/m.   Physical Exam: General exam: Pleasant, young Caucasian  male, mild distress from persistent nausea Skin: No rashes, lesions or ulcers. HEENT: Atraumatic, normocephalic, no obvious bleeding Lungs: Clear to auscultation bilaterally,  CVS: S1, S2, no murmur,   GI/Abd: Soft, nontender, nondistended, bowel sound present,   CNS: Alert, awake, oriented x 3 Psychiatry: Mood appropriate Extremities: No pedal edema, no calf tenderness,   Data Review: I have personally reviewed the laboratory data and studies available.  F/u labs ordered Unresulted Labs (From admission, onward)     Start     Ordered   10/10/24 0500  Creatinine, serum  (enoxaparin  (LOVENOX )    CrCl >/= 30 ml/min)  Weekly,   R     Comments: while on enoxaparin  therapy    10/03/24 2139   10/04/24 0909  Hemoglobin A1c  Add-on,   AD        10/04/24 0908   10/03/24 2140  Basic metabolic panel  (Diabetes Ketoacidosis (DKA))  STAT Now then every 4 hours ,   R      10/03/24 2139   10/03/24 2140  Beta-hydroxybutyric acid  (Diabetes Ketoacidosis (DKA))  Now then every 4 hours,   R      10/03/24 2139   Unscheduled  CBC with Differential/Platelet  Tomorrow morning,   R        10/04/24 1201   Unscheduled  Basic metabolic panel with GFR  Tomorrow morning,   R        10/04/24 1201            Signed, Chapman Rota, MD [facility] 10/04/2024

## 2024-10-05 LAB — BASIC METABOLIC PANEL WITH GFR
Anion gap: 8 (ref 5–15)
BUN: 12 mg/dL (ref 6–20)
CO2: 27 mmol/L (ref 22–32)
Calcium: 8 mg/dL — ABNORMAL LOW (ref 8.9–10.3)
Chloride: 102 mmol/L (ref 98–111)
Creatinine, Ser: 0.63 mg/dL (ref 0.61–1.24)
GFR, Estimated: >60 mL/min (ref >60)
Glucose, Bld: 111 mg/dL — ABNORMAL HIGH (ref 70–99)
Potassium: 3.1 mmol/L — ABNORMAL LOW (ref 3.5–5.1)
Sodium: 137 mmol/L (ref 135–145)

## 2024-10-05 LAB — CBC WITH DIFFERENTIAL/PLATELET
Abs Immature Granulocytes: 0.02 K/uL (ref 0.00–0.07)
Basophils Absolute: 0 K/uL (ref 0.0–0.1)
Basophils Relative: 0 %
Eosinophils Absolute: 0 K/uL (ref 0.0–0.5)
Eosinophils Relative: 0 %
HCT: 39 % (ref 39.0–52.0)
Hemoglobin: 13.3 g/dL (ref 13.0–17.0)
Immature Granulocytes: 0 %
Lymphocytes Relative: 30 %
Lymphs Abs: 3.1 K/uL (ref 0.7–4.0)
MCH: 28.2 pg (ref 26.0–34.0)
MCHC: 34.1 g/dL (ref 30.0–36.0)
MCV: 82.8 fL (ref 80.0–100.0)
Monocytes Absolute: 1.3 K/uL — ABNORMAL HIGH (ref 0.1–1.0)
Monocytes Relative: 12 %
Neutro Abs: 5.8 K/uL (ref 1.7–7.7)
Neutrophils Relative %: 58 %
Platelets: 255 K/uL (ref 150–400)
RBC: 4.71 MIL/uL (ref 4.22–5.81)
RDW: 12.6 % (ref 11.5–15.5)
WBC: 10.3 K/uL (ref 4.0–10.5)
nRBC: 0 % (ref 0.0–0.2)

## 2024-10-05 LAB — GLUCOSE, CAPILLARY: Glucose-Capillary: 201 mg/dL — ABNORMAL HIGH (ref 70–99)

## 2024-10-05 MED ORDER — INSULIN NPH ISOPHANE & REGULAR (70-30) 100 UNIT/ML ~~LOC~~ SUSP: 22.0000 [IU] | Freq: Two times a day (BID) | SUBCUTANEOUS | Status: AC

## 2024-10-05 MED ORDER — POTASSIUM CHLORIDE CRYS ER 20 MEQ PO TBCR
40.0000 meq | EXTENDED_RELEASE_TABLET | ORAL | Status: AC
  Administered 2024-10-05 (×2): 40 meq via ORAL
  Filled 2024-10-05 (×2): qty 2

## 2024-10-05 NOTE — TOC Transition Note (Signed)
 Transition of Care [facility]) - Discharge Note   Patient Details  Name: Kevin Russell MRN: [id number] Date of Birth: 07-24-1986  [facility] CM/SW Contact:  Landry DELENA Senters, RN Phone Number: 10/05/2024, 9:55 AM   Clinical Narrative:     Patient lives alone, reports having support at home, does have a ride home at d/c. Patient does not have health insurance, no PCP, does take insulin  for DM type I.   CM sent patient info to Financial counseling with patient permission to check for Medicaid eligibility. Patient reports he doesn't have PCP anymore, does still have an active prescription for insulin  that he has been filling at [facility].   Appointment scheduled with [facility]. Info on AVS.   No further needs identified by CM.     Final next level of care: Home/Self Care Barriers to Discharge: No Barriers Identified   Patient Goals and CMS Choice   CMS Medicare.gov Compare Post Acute Care list provided to:: Patient Choice offered to / list presented to : Patient      Discharge Placement                       Discharge Plan and Services Additional resources added to the After Visit Summary for                                       Social Drivers of Health (SDOH) Interventions SDOH Screenings   Food Insecurity: Food Insecurity Present (10/04/2024)  Housing: High Risk (10/04/2024)  Transportation Needs: Unmet Transportation Needs (10/04/2024)  Utilities: At Risk (10/04/2024)  Depression (PHQ2-9): Low Risk (09/15/2022)  Tobacco Use: High Risk (10/03/2024)     Readmission Risk Interventions     No data to display

## 2024-10-05 NOTE — Plan of Care (Signed)
  Problem: Education: Goal: Ability to describe self-care measures that may prevent or decrease complications (Diabetes Survival Skills Education) will improve Outcome: Completed/Met Goal: Individualized Educational Video(s) Outcome: Completed/Met   Problem: Coping: Goal: Ability to adjust to condition or change in health will improve Outcome: Completed/Met   Problem: Fluid Volume: Goal: Ability to maintain a balanced intake and output will improve Outcome: Completed/Met   Problem: Health Behavior/Discharge Planning: Goal: Ability to identify and utilize available resources and services will improve Outcome: Completed/Met Goal: Ability to manage health-related needs will improve Outcome: Completed/Met   Problem: Metabolic: Goal: Ability to maintain appropriate glucose levels will improve Outcome: Completed/Met   Problem: Nutritional: Goal: Maintenance of adequate nutrition will improve Outcome: Completed/Met Goal: Progress toward achieving an optimal weight will improve Outcome: Completed/Met   Problem: Skin Integrity: Goal: Risk for impaired skin integrity will decrease Outcome: Completed/Met   Problem: Tissue Perfusion: Goal: Adequacy of tissue perfusion will improve Outcome: Completed/Met   Problem: Education: Goal: Ability to describe self-care measures that may prevent or decrease complications (Diabetes Survival Skills Education) will improve Outcome: Completed/Met Goal: Individualized Educational Video(s) Outcome: Completed/Met   Problem: Cardiac: Goal: Ability to maintain an adequate cardiac output will improve Outcome: Completed/Met   Problem: Health Behavior/Discharge Planning: Goal: Ability to identify and utilize available resources and services will improve Outcome: Completed/Met Goal: Ability to manage health-related needs will improve Outcome: Completed/Met   Problem: Fluid Volume: Goal: Ability to achieve a balanced intake and output will  improve Outcome: Completed/Met   Problem: Metabolic: Goal: Ability to maintain appropriate glucose levels will improve Outcome: Completed/Met   Problem: Nutritional: Goal: Maintenance of adequate nutrition will improve Outcome: Completed/Met Goal: Maintenance of adequate weight for body size and type will improve Outcome: Completed/Met   Problem: Respiratory: Goal: Will regain and/or maintain adequate ventilation Outcome: Completed/Met   Problem: Urinary Elimination: Goal: Ability to achieve and maintain adequate renal perfusion and functioning will improve Outcome: Completed/Met   Problem: Education: Goal: Knowledge of General Education information will improve Description: Including pain rating scale, medication(s)/side effects and non-pharmacologic comfort measures Outcome: Completed/Met   Problem: Health Behavior/Discharge Planning: Goal: Ability to manage health-related needs will improve Outcome: Completed/Met   Problem: Clinical Measurements: Goal: Ability to maintain clinical measurements within normal limits will improve Outcome: Completed/Met Goal: Will remain free from infection Outcome: Completed/Met Goal: Diagnostic test results will improve Outcome: Completed/Met Goal: Respiratory complications will improve Outcome: Completed/Met Goal: Cardiovascular complication will be avoided Outcome: Completed/Met   Problem: Activity: Goal: Risk for activity intolerance will decrease Outcome: Completed/Met   Problem: Nutrition: Goal: Adequate nutrition will be maintained Outcome: Completed/Met   Problem: Coping: Goal: Level of anxiety will decrease Outcome: Completed/Met   Problem: Elimination: Goal: Will not experience complications related to bowel motility Outcome: Completed/Met Goal: Will not experience complications related to urinary retention Outcome: Completed/Met   Problem: Pain Managment: Goal: General experience of comfort will improve and/or  be controlled Outcome: Completed/Met   Problem: Safety: Goal: Ability to remain free from injury will improve Outcome: Completed/Met   Problem: Skin Integrity: Goal: Risk for impaired skin integrity will decrease Outcome: Completed/Met

## 2024-10-05 NOTE — Discharge Summary (Signed)
 Physician Discharge Summary  Kevin Russell FMW:[id number] DOB: 1986-07-11 DOA: 10/03/2024  PCP: Pcp, No  Admit date: 10/03/2024 Discharge date: 10/05/2024  Admitted From: (Home) Disposition:  (Home )  Recommendations for Outpatient Follow-up:  Follow up with PCP in 1-2 weeks Please obtain BMP/CBC in one week   Diet recommendation: Carb Modified    Brief/Interim Summary:  Kevin Russell is a 38 y.o. male with PMH significant for DM1, episodes of DKA, h/o suicidal ideation, chronic smoking, chronic alcohol use.  Was admitted with intractable nausea, vomiting, and diarrhea for 2 days, workup significant for DKA on presentation, was started on DKA insulin  drip protocol, for which his gap has closed, and he was transitioned to home insulin , and he is being discharged today..  DKA Type 1 diabetes mellitus uncontrolled Presented with intractable nausea, vomiting, diarrhea Denies any sick contact but could have had an exposure as he works in [employer] Workup suggestive of DKA Started on IV insulin  infusion, IV fluid, BMP monitoring, anion gap has closed, so he was transitioned to his home insulin . - His A1c elevated at 11.2, improved from 11.9 last October, I have discussed with the patient that he will will need a higher dose of insulin  20/30 at 22 twice daily, but he declined, reports it will cost more money, so I have offered him worker assessment and evaluation to assist with his medication, but he declined, reports I just wanted this way,  I have offered him to try metformin but he declined as well, as well as offered him to give him supply of blood capillary glucose monitoring at time of discharge he has declined as well, he declined for me to refer him to our [facility] as well.   Intractable nausea vomiting diarrhea Suspect viral gastroenteritis versus gastroparesis Resolved  Leukocytosis Lactic acidosis Elevated WBC count and lactic acid likely due to  dehydration. Leukocytosis resolved, lactic acid was not repeated.  Hypokalemia -replaced before discharge.   Discharge Diagnoses:  Active Problems:   Leucocytosis   Erythrocytosis   Diabetic ketoacidosis (HCC)   DKA, type 1 (HCC)   Nausea vomiting and diarrhea    Discharge Instructions  Discharge Instructions     Increase activity slowly   Complete by: As directed       Allergies as of 10/05/2024   Not on File      Medication List     TAKE these medications    insulin  NPH-regular Human (70-30) 100 UNIT/ML injection Inject 22 Units into the skin 2 (two) times daily with a meal. What changed: how much to take        Follow-up Information     [facility] Follow up.   Why: need to establish a PCP Contact information: [address] [place]  [address] [phone]               Allergies[1]  Consultations: None   Procedures/Studies: No results found.    Subjective: Denies any complaints this morning, no nausea, no vomiting, asking to go home  Discharge Exam: Vitals:   10/05/24 0532 10/05/24 0810  BP: 110/78 104/79  Pulse: (!) 101 89  Resp: 17 18  Temp: 98.8 F (37.1 C) 98.4 F (36.9 C)  SpO2:  100%   Vitals:   10/04/24 2003 10/04/24 2230 10/05/24 0532 10/05/24 0810  BP: 106/69 99/69 110/78 104/79  Pulse: 89 97 (!) 101 89  Resp: 16 19 17 18   Temp: 99 F (37.2  C) 99 F (37.2 C) 98.8 F (37.1 C) 98.4 F (36.9 C)  TempSrc: Oral Oral Oral Oral  SpO2: 100% 99%  100%  Weight:      Height:        General: Pt is alert, awake, not in acute distress Cardiovascular: RRR, Respiratory: CTA  Abdominal: Soft Extremities: no edema, no cyanosis    The results of significant diagnostics from this hospitalization (including imaging, microbiology, ancillary and laboratory) are listed below for reference.     Microbiology: Recent Results (from the past 240 hours)  Blood culture (routine x  2)     Status: None (Preliminary result)   Collection Time: 10/03/24  7:50 PM   Specimen: BLOOD RIGHT FOREARM  Result Value Ref Range Status   Specimen Description BLOOD RIGHT FOREARM  Final   Special Requests   Final    BOTTLES DRAWN AEROBIC AND ANAEROBIC Blood Culture results may not be optimal due to an inadequate volume of blood received in culture bottles   Culture   Final    NO GROWTH 2 DAYS Performed at [facility], [address], [place], [place] [address]    Report Status PENDING  Incomplete  Blood culture (routine x 2)     Status: None (Preliminary result)   Collection Time: 10/03/24  7:53 PM   Specimen: BLOOD  Result Value Ref Range Status   Specimen Description BLOOD RIGHT ANTECUBITAL  Final   Special Requests   Final    BOTTLES DRAWN AEROBIC AND ANAEROBIC Blood Culture results may not be optimal due to an inadequate volume of blood received in culture bottles   Culture   Final    NO GROWTH 2 DAYS Performed at [facility], [address], [place], [place] [address]    Report Status PENDING  Incomplete     Labs: BNP (last 3 results) No results for input(s): BNP in the last 8760 hours. Basic Metabolic Panel: Recent Labs  Lab 10/04/24 0212 10/04/24 0539 10/04/24 1003 10/04/24 1418 10/05/24 0304  NA 130* 133* 132* 134* 137  K 4.3 4.2 3.7 3.8 3.1*  CL 95* 99 97* 98 102  CO2 26 28 29 27 27   GLUCOSE 173* 125* 177* 199* 111*  BUN 22* 20 19 17 12   CREATININE 0.85 0.77 0.88 0.85 0.63  CALCIUM  7.9* 8.1* 8.3* 8.0* 8.0*   Liver Function Tests: Recent Labs  Lab 10/03/24 1715  AST 19  ALT 21  ALKPHOS 106  BILITOT 2.1*  PROT 7.6  ALBUMIN 4.0   Recent Labs  Lab 10/03/24 1715  LIPASE 21   No results for input(s): AMMONIA in the last 168 hours. CBC: Recent Labs  Lab 10/03/24 1715 10/03/24 1738 10/03/24 2009 10/03/24 2240 10/05/24 0304  WBC 18.4*  --   --  18.6* 10.3  NEUTROABS  --   --   --   --  5.8  HGB 17.1* 17.7* 17.3* 15.7 13.3   HCT 49.2 52.0 51.0 43.7 39.0  MCV 84.4  --   --  83.1 82.8  PLT 345  --   --  349 255   Cardiac Enzymes: No results for input(s): CKTOTAL, CKMB, CKMBINDEX, TROPONINI in the last 168 hours. BNP: Invalid input(s): POCBNP CBG: Recent Labs  Lab 10/04/24 1835 10/04/24 1939 10/04/24 2045 10/04/24 2349 10/05/24 0807  GLUCAP 180* 173* 202* 93 201*   D-Dimer No results for input(s): DDIMER in the last 72 hours. Hgb A1c Recent Labs    10/04/24  1418  HGBA1C 11.2*   Lipid Profile No results for input(s): CHOL, HDL, LDLCALC, TRIG, CHOLHDL, LDLDIRECT in the last 72 hours. Thyroid function studies No results for input(s): TSH, T4TOTAL, T3FREE, THYROIDAB in the last 72 hours.  Invalid input(s): FREET3 Anemia work up No results for input(s): VITAMINB12, FOLATE, FERRITIN, TIBC, IRON, RETICCTPCT in the last 72 hours. Urinalysis    Component Value Date/Time   COLORURINE YELLOW 10/04/2024 0445   APPEARANCEUR CLEAR 10/04/2024 0445   LABSPEC 1.010 10/04/2024 0445   PHURINE 6.0 10/04/2024 0445   GLUCOSEU >=500 (A) 10/04/2024 0445   HGBUR NEGATIVE 10/04/2024 0445   BILIRUBINUR NEGATIVE 10/04/2024 0445   KETONESUR >80 (A) 10/04/2024 0445   PROTEINUR NEGATIVE 10/04/2024 0445   UROBILINOGEN 0.2 06/01/2012 0916   NITRITE NEGATIVE 10/04/2024 0445   LEUKOCYTESUR NEGATIVE 10/04/2024 0445   Sepsis Labs Recent Labs  Lab 10/03/24 1715 10/03/24 2240 10/05/24 0304  WBC 18.4* 18.6* 10.3   Microbiology Recent Results (from the past 240 hours)  Blood culture (routine x 2)     Status: None (Preliminary result)   Collection Time: 10/03/24  7:50 PM   Specimen: BLOOD RIGHT FOREARM  Result Value Ref Range Status   Specimen Description BLOOD RIGHT FOREARM  Final   Special Requests   Final    BOTTLES DRAWN AEROBIC AND ANAEROBIC Blood Culture results may not be optimal due to an inadequate volume of blood received in culture bottles   Culture   Final     NO GROWTH 2 DAYS Performed at [facility], [address], [place], [place] [address]    Report Status PENDING  Incomplete  Blood culture (routine x 2)     Status: None (Preliminary result)   Collection Time: 10/03/24  7:53 PM   Specimen: BLOOD  Result Value Ref Range Status   Specimen Description BLOOD RIGHT ANTECUBITAL  Final   Special Requests   Final    BOTTLES DRAWN AEROBIC AND ANAEROBIC Blood Culture results may not be optimal due to an inadequate volume of blood received in culture bottles   Culture   Final    NO GROWTH 2 DAYS Performed at [facility], [address] [place], [place] [address]    Report Status PENDING  Incomplete     Time coordinating discharge: Over 30 minutes  SIGNED:   Brayton Lye, MD  [facility] 10/05/2024, 9:47 AM Pager   If 7PM-7AM, please contact night-coverage [email or web address] Password [email or web address]    [1] Not on File

## 2024-10-05 NOTE — Plan of Care (Signed)

## 2024-10-08 LAB — CULTURE, BLOOD (ROUTINE X 2)
Culture: NO GROWTH
Culture: NO GROWTH

## 2024-10-16 ENCOUNTER — Inpatient Hospital Stay (HOSPITAL_BASED_OUTPATIENT_CLINIC_OR_DEPARTMENT_OTHER): Payer: Self-pay

## 2024-11-05 ENCOUNTER — Inpatient Hospital Stay (HOSPITAL_BASED_OUTPATIENT_CLINIC_OR_DEPARTMENT_OTHER): Payer: Self-pay
